# Patient Record
Sex: Female | Born: 1985
Health system: Southern US, Community
[De-identification: ages and names within clinical notes are randomized; demographics above are authoritative.]

## PROBLEM LIST (undated history)

## (undated) ENCOUNTER — Inpatient Hospital Stay (HOSPITAL_COMMUNITY): Payer: Self-pay

## (undated) DIAGNOSIS — R51 Headache: Secondary | ICD-10-CM

## (undated) DIAGNOSIS — I10 Essential (primary) hypertension: Secondary | ICD-10-CM

## (undated) DIAGNOSIS — O903 Peripartum cardiomyopathy: Secondary | ICD-10-CM

## (undated) DIAGNOSIS — Z973 Presence of spectacles and contact lenses: Secondary | ICD-10-CM

## (undated) DIAGNOSIS — D649 Anemia, unspecified: Secondary | ICD-10-CM

## (undated) DIAGNOSIS — K219 Gastro-esophageal reflux disease without esophagitis: Secondary | ICD-10-CM

## (undated) DIAGNOSIS — F419 Anxiety disorder, unspecified: Secondary | ICD-10-CM

## (undated) DIAGNOSIS — G43909 Migraine, unspecified, not intractable, without status migrainosus: Secondary | ICD-10-CM

## (undated) DIAGNOSIS — I509 Heart failure, unspecified: Secondary | ICD-10-CM

## (undated) DIAGNOSIS — R519 Headache, unspecified: Secondary | ICD-10-CM

## (undated) DIAGNOSIS — J302 Other seasonal allergic rhinitis: Secondary | ICD-10-CM

## (undated) DIAGNOSIS — N390 Urinary tract infection, site not specified: Secondary | ICD-10-CM

## (undated) DIAGNOSIS — F32A Depression, unspecified: Secondary | ICD-10-CM

## (undated) DIAGNOSIS — K259 Gastric ulcer, unspecified as acute or chronic, without hemorrhage or perforation: Secondary | ICD-10-CM

## (undated) DIAGNOSIS — F329 Major depressive disorder, single episode, unspecified: Secondary | ICD-10-CM

## (undated) HISTORY — PX: NO PAST SURGERIES: SHX2092

## (undated) HISTORY — DX: Depression, unspecified: F32.A

## (undated) HISTORY — DX: Headache, unspecified: R51.9

## (undated) HISTORY — PX: COLONOSCOPY: SHX174

## (undated) HISTORY — DX: Migraine, unspecified, not intractable, without status migrainosus: G43.909

## (undated) HISTORY — DX: Major depressive disorder, single episode, unspecified: F32.9

## (undated) HISTORY — DX: Urinary tract infection, site not specified: N39.0

## (undated) HISTORY — DX: Anemia, unspecified: D64.9

## (undated) HISTORY — DX: Headache: R51

---
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
## 2008-06-22 ENCOUNTER — Emergency Department (HOSPITAL_COMMUNITY): Admission: EM | Admit: 2008-06-22 | Discharge: 2008-06-22 | Payer: Self-pay | Admitting: Emergency Medicine

## 2008-09-10 ENCOUNTER — Emergency Department (HOSPITAL_COMMUNITY): Admission: EM | Admit: 2008-09-10 | Discharge: 2008-09-10 | Payer: Self-pay | Admitting: Emergency Medicine

## 2008-09-13 ENCOUNTER — Emergency Department (HOSPITAL_COMMUNITY): Admission: EM | Admit: 2008-09-13 | Discharge: 2008-09-13 | Payer: Self-pay | Admitting: Emergency Medicine

## 2009-01-11 ENCOUNTER — Emergency Department (HOSPITAL_BASED_OUTPATIENT_CLINIC_OR_DEPARTMENT_OTHER): Admission: EM | Admit: 2009-01-11 | Discharge: 2009-01-11 | Payer: Self-pay | Admitting: Emergency Medicine

## 2009-06-24 ENCOUNTER — Emergency Department (HOSPITAL_COMMUNITY): Admission: EM | Admit: 2009-06-24 | Discharge: 2009-06-24 | Payer: Self-pay | Admitting: Emergency Medicine

## 2010-10-12 LAB — URINALYSIS, ROUTINE W REFLEX MICROSCOPIC
Ketones, ur: 15 mg/dL — AB
Nitrite: NEGATIVE
Protein, ur: NEGATIVE mg/dL
Urobilinogen, UA: 1 mg/dL (ref 0.0–1.0)

## 2010-10-12 LAB — PREGNANCY, URINE: Preg Test, Ur: NEGATIVE

## 2011-04-10 LAB — POCT URINALYSIS DIP (DEVICE)
Nitrite: NEGATIVE
Protein, ur: NEGATIVE mg/dL
Urobilinogen, UA: 1 mg/dL (ref 0.0–1.0)

## 2011-04-10 LAB — WET PREP, GENITAL

## 2011-04-10 LAB — POCT PREGNANCY, URINE: Preg Test, Ur: NEGATIVE

## 2012-07-06 NOTE — L&D Delivery Note (Signed)
Delivery Note At 7:50 AM a viable female was delivered via  (Presentation: vtx ;OA).  Nuchal cord x 1 noted and easily reduced.  No difficulty with shoulders.  Pt delivered in all fours position.  Cord doubly clamped, cut and to warmer for evaluation by NICU team due to moderate to thick meconium stained amniotic fluid.   APGAR: 8, 9; weight 6 lb 1.2 oz (2756 g).   Placenta status: Intact, Spontaneous.  Cord:  with the following complications: .  Cord pH: N/A  Anesthesia:  None Episiotomy: None Lacerations: None Suture Repair: N/A Est. Blood Loss (mL): 500  Mom to AICU.  Baby to AICU with mother.Marland Kitchen  Abron Neddo O. 03/18/2013, 8:16 AM

## 2012-08-06 ENCOUNTER — Encounter (HOSPITAL_COMMUNITY): Payer: Self-pay | Admitting: Obstetrics and Gynecology

## 2012-08-06 ENCOUNTER — Inpatient Hospital Stay (HOSPITAL_COMMUNITY)
Admission: AD | Admit: 2012-08-06 | Discharge: 2012-08-06 | Disposition: A | Discharge status: Home / Self Care | Payer: BC Managed Care – PPO | Source: Ambulatory Visit | Attending: Obstetrics & Gynecology | Admitting: Obstetrics & Gynecology

## 2012-08-06 DIAGNOSIS — R51 Headache: Secondary | ICD-10-CM | POA: Insufficient documentation

## 2012-08-06 DIAGNOSIS — O210 Mild hyperemesis gravidarum: Secondary | ICD-10-CM | POA: Insufficient documentation

## 2012-08-06 DIAGNOSIS — O10019 Pre-existing essential hypertension complicating pregnancy, unspecified trimester: Secondary | ICD-10-CM | POA: Insufficient documentation

## 2012-08-06 DIAGNOSIS — O219 Vomiting of pregnancy, unspecified: Secondary | ICD-10-CM

## 2012-08-06 DIAGNOSIS — O169 Unspecified maternal hypertension, unspecified trimester: Secondary | ICD-10-CM

## 2012-08-06 HISTORY — DX: Essential (primary) hypertension: I10

## 2012-08-06 LAB — URINALYSIS, ROUTINE W REFLEX MICROSCOPIC
Glucose, UA: NEGATIVE mg/dL
Ketones, ur: NEGATIVE mg/dL
Nitrite: NEGATIVE
Specific Gravity, Urine: 1.02 (ref 1.005–1.030)
Urobilinogen, UA: 0.2 mg/dL (ref 0.0–1.0)
pH: 7 (ref 5.0–8.0)

## 2012-08-06 MED ORDER — DIPHENHYDRAMINE HCL 50 MG/ML IJ SOLN
25.0000 mg | Freq: Once | INTRAMUSCULAR | Status: DC
Start: 2012-08-06 — End: 2012-08-06

## 2012-08-06 MED ORDER — PROMETHAZINE HCL 25 MG/ML IJ SOLN: 25.0000 mg | Freq: Once | INTRAMUSCULAR | Status: DC

## 2012-08-06 MED ORDER — PROMETHAZINE HCL 25 MG/ML IJ SOLN
25.0000 mg | Freq: Once | INTRAVENOUS | Status: AC
  Administered 2012-08-06: 25 mg via INTRAVENOUS
  Filled 2012-08-06: qty 1

## 2012-08-06 MED ORDER — LACTATED RINGERS IV BOLUS (SEPSIS): 1000.0000 mL | Freq: Once | INTRAVENOUS | Status: DC

## 2012-08-06 MED ORDER — ACETAMINOPHEN 500 MG PO TABS: 1000.0000 mg | ORAL_TABLET | Freq: Once | ORAL | Status: DC

## 2012-08-06 MED ORDER — ONDANSETRON HCL 4 MG PO TABS: 4.0000 mg | ORAL_TABLET | Freq: Four times a day (QID) | ORAL | Status: DC

## 2012-08-06 MED ORDER — PROMETHAZINE HCL 12.5 MG PO TABS: 12.5000 mg | ORAL_TABLET | Freq: Four times a day (QID) | ORAL | Status: DC | PRN

## 2012-08-06 MED ORDER — ONDANSETRON 8 MG PO TBDP: 8.0000 mg | ORAL_TABLET | Freq: Once | ORAL | Status: DC

## 2012-08-06 NOTE — MAU Note (Signed)
Pt presents to MAU with chief complaint of HA. She is approx. [redacted] weeks pregnant and says she went to [facility] to check her BP and it was elevated.  She has tried tylenol but nothing has helped. She has a strong family history of hypertension. She was seeing a primary care provider prior to pregnancy and diagnosed her with HTN.  They recommended medication and the patient refused.

## 2012-08-06 NOTE — MAU Provider Note (Signed)
History     CSN: [id number]  Arrival date and time: 08/06/12 1129   First Provider Initiated Contact with Patient 08/06/12 1212      Chief Complaint  Patient presents with  . Headache   HPI [name] is a 27 y.o. G1P0000 at [redacted]w[redacted]d who presents to MAU today with headache, elevated BP and N/V. The patient states that she has had N/V since last night. The last time she vomited was here in MAU. She has not had an appetite today. No PO intake since last night. She has also had headache since last night. It has been worse throughout the night and this morning. She had been diagnosed as chronic hypertensive previously, but had declined treatment. [facility] and had her BP taken. It was 142/99.  The patient has not yet started prenatal care. She has an appointment for [facility]. She has not yet started prenatal vitamins yet either. She is having mild epigastric discomfort. She denies vaginal bleeding, abnormal discharge or lower abdominal pain.    OB History    Grav Para Term Preterm Abortions TAB SAB Ect Mult Living   1 0 0 0 0 0 0 0 0 0       Past Medical History  Diagnosis Date  . Hypertension     Past Surgical History  Procedure Date  . No past surgeries     Family History  Problem Relation Age of Onset  . Hypertension Mother   . Hypertension Father     History  Substance Use Topics  . Smoking status: Never Smoker   . Smokeless tobacco: Not on file  . Alcohol Use: No    Allergies: No Known Allergies  Prescriptions prior to admission  Medication Sig Dispense Refill  . acetaminophen (TYLENOL) 325 MG tablet Take 650 mg by mouth every 6 (six) hours as needed. pain      . amoxicillin (AMOXIL) 500 MG capsule Take 500 mg by mouth 2 (two) times daily. Patient is on day 5 of a 10 day course        ROS All negative unless otherwise noted in HPI Physical Exam   Blood pressure 148/98, pulse 88, temperature 98.3  F (36.8 C), temperature source Oral, resp. rate 18, last menstrual period 06/20/2012.  Physical Exam  Constitutional: She is oriented to person, place, and time. She appears well-developed and well-nourished. No distress.  HENT:  Head: Normocephalic and atraumatic.  Cardiovascular: Normal rate, regular rhythm and normal heart sounds.   Respiratory: Breath sounds normal. No respiratory distress.  GI: Soft. Bowel sounds are normal. She exhibits no distension and no mass. There is tenderness (mild epigastric tenderness to palpation). There is no rebound and no guarding.  Neurological: She is alert and oriented to person, place, and time.  Skin: Skin is warm and dry. No erythema.  Psychiatric: She has a normal mood and affect.   Results for orders placed during the hospital encounter of 08/06/12 (from the past 24 hour(s))  URINALYSIS, ROUTINE W REFLEX MICROSCOPIC     Status: Abnormal   Collection Time   08/06/12 11:44 AM      Component Value Range   Color, Urine YELLOW  YELLOW   APPearance HAZY (*) CLEAR   Specific Gravity, Urine 1.020  1.005 - 1.030   pH 7.0  5.0 - 8.0   Glucose, UA NEGATIVE  NEGATIVE mg/dL   Hgb urine dipstick NEGATIVE  NEGATIVE  Bilirubin Urine NEGATIVE  NEGATIVE   Ketones, ur NEGATIVE  NEGATIVE mg/dL   Protein, ur NEGATIVE  NEGATIVE mg/dL   Urobilinogen, UA 0.2  0.0 - 1.0 mg/dL   Nitrite NEGATIVE  NEGATIVE   Leukocytes, UA NEGATIVE  NEGATIVE    MAU Course  Procedures None  MDM Discussed patient with Dr. Debroah Loop. He does not feel that the patient needs antihypertensives at this time. He would recommend that she keep follow-up with [facility] as scheduled to start prenatal care and discuss treatment of HTN in pregnancy  Patient states that she is nauseous on initial exam. Before Zofran is administered she begins actively vomiting. Order discontinued.  LR bolus IV with phenergan ordered for N/V RN had difficulty starting IV. Patient had one higher BP  reading during this time. She was nauseous and had significant discomfort at that time. BP's have otherwise been ok. 140s/90s N/V and headache improved.    Assessment and Plan  A: Nausea and Vomiting in pregnancy Chronic HTN; currently pregnant  P: Discharge home Rx for Phenergan and Zofran sent to patient's pharmacy Patient encouraged to increase PO hydration as tolerated Tylenol as needed for headache.  Symptoms of high blood pressure reviewed. Patient encouraged to return if symptoms arise.  Patient encouraged to keep appointment to start prenatal care with [facility] this week Patient may return to MAU as needed or if her condition were to change or worsen  Freddi Starr, PA-C 08/06/2012, 12:12 PM

## 2012-09-06 ENCOUNTER — Encounter (HOSPITAL_COMMUNITY): Payer: Self-pay | Admitting: *Deleted

## 2012-09-06 ENCOUNTER — Inpatient Hospital Stay (HOSPITAL_COMMUNITY)
Admission: AD | Admit: 2012-09-06 | Discharge: 2012-09-06 | Disposition: A | Discharge status: Home / Self Care | Payer: Medicaid Other | Source: Ambulatory Visit | Attending: Obstetrics and Gynecology | Admitting: Obstetrics and Gynecology

## 2012-09-06 DIAGNOSIS — J069 Acute upper respiratory infection, unspecified: Secondary | ICD-10-CM

## 2012-09-06 DIAGNOSIS — R51 Headache: Secondary | ICD-10-CM | POA: Insufficient documentation

## 2012-09-06 DIAGNOSIS — J029 Acute pharyngitis, unspecified: Secondary | ICD-10-CM | POA: Insufficient documentation

## 2012-09-06 DIAGNOSIS — O99891 Other specified diseases and conditions complicating pregnancy: Secondary | ICD-10-CM | POA: Insufficient documentation

## 2012-09-06 DIAGNOSIS — R0981 Nasal congestion: Secondary | ICD-10-CM

## 2012-09-06 DIAGNOSIS — O219 Vomiting of pregnancy, unspecified: Secondary | ICD-10-CM

## 2012-09-06 DIAGNOSIS — O210 Mild hyperemesis gravidarum: Secondary | ICD-10-CM | POA: Insufficient documentation

## 2012-09-06 DIAGNOSIS — J3489 Other specified disorders of nose and nasal sinuses: Secondary | ICD-10-CM | POA: Insufficient documentation

## 2012-09-06 LAB — URINALYSIS, ROUTINE W REFLEX MICROSCOPIC
Glucose, UA: NEGATIVE mg/dL
Hgb urine dipstick: NEGATIVE
Ketones, ur: 15 mg/dL — AB
Leukocytes, UA: NEGATIVE
Protein, ur: NEGATIVE mg/dL
pH: 6 (ref 5.0–8.0)

## 2012-09-06 MED ORDER — ACETAMINOPHEN 325 MG PO TABS
650.0000 mg | ORAL_TABLET | Freq: Once | ORAL | Status: AC
  Administered 2012-09-06: 650 mg via ORAL
  Filled 2012-09-06: qty 1

## 2012-09-06 MED ORDER — ONDANSETRON HCL 4 MG PO TABS
8.0000 mg | ORAL_TABLET | Freq: Once | ORAL | Status: AC
  Administered 2012-09-06: 8 mg via ORAL
  Filled 2012-09-06: qty 2
  Filled 2012-09-06: qty 1

## 2012-09-06 NOTE — MAU Provider Note (Signed)
History     CSN: [id number]  Arrival date and time: 09/06/12 1304   None     Chief Complaint  Patient presents with  . Headache  . Sore Throat  . Emesis   HPI Meredith Velez is 27 y.o. G1P0000 [redacted]w[redacted]d weeks presenting with headache last night, sore throat this am, nausea.  Has not vomiting.  Has yellow mucous.  Facial discomfort around her eyes and nose X 2 days.   Was a patient of Dr. Vance Gather until she changed jobs, now has Medicaid which they do not accept.  Plans continues to have care [facility].   Card expected in 1 week and will schedule care.   Had nausea for a few days, treated early in pregnancy.   Has ZOfran and phenergan at home, wasn't sure if she should take it.     Past Medical History  Diagnosis Date  . Hypertension     Past Surgical History  Procedure Laterality Date  . No past surgeries      Family History  Problem Relation Age of Onset  . Hypertension Mother   . Hypertension Father     History  Substance Use Topics  . Smoking status: Never Smoker   . Smokeless tobacco: Not on file  . Alcohol Use: No    Allergies: No Known Allergies  Prescriptions prior to admission  Medication Sig Dispense Refill  . acetaminophen (TYLENOL) 325 MG tablet Take 650 mg by mouth every 6 (six) hours as needed. pain      . Prenatal Vit-Fe Fumarate-FA (PRENATAL MULTIVITAMIN) TABS Take 1 tablet by mouth daily at 12 noon.        Review of Systems  Constitutional: Negative for fever and chills.  HENT: Positive for congestion and sore throat.        Sinus pressure.  Yellow phlegm  Gastrointestinal: Positive for nausea. Negative for vomiting and abdominal pain.  Neurological: Positive for headaches.   Physical Exam   Blood pressure 130/94, pulse 96, temperature 98.2 F (36.8 C), temperature source Oral, resp. rate 18, height 5\' 2"  (1.575 m), weight 157 lb (71.215 kg), last menstrual period 06/20/2012.  Physical Exam  Constitutional: She is oriented to person, place, and  time. She appears well-developed and well-nourished. No distress.  HENT:  Head: Normocephalic.  Nose: Right sinus exhibits no frontal sinus tenderness (mild). Left sinus exhibits no frontal sinus tenderness (mild).  Mouth/Throat: No oropharyngeal exudate, posterior oropharyngeal edema, posterior oropharyngeal erythema or tonsillar abscesses.  Cardiovascular: Normal rate.   Respiratory: Effort normal.  Genitourinary:  Not indicated  Neurological: She is alert and oriented to person, place, and time.  Skin: Skin is warm and dry.  Psychiatric: She has a normal mood and affect. Her behavior is normal.   Results for orders placed during the hospital encounter of 09/06/12 (from the past 24 hour(s))  RAPID STREP SCREEN     Status: None   Collection Time    09/06/12  1:04 PM      Result Value Range   Streptococcus, Group A Screen (Direct) NEGATIVE  NEGATIVE  URINALYSIS, ROUTINE W REFLEX MICROSCOPIC     Status: Abnormal   Collection Time    09/06/12  2:30 PM      Result Value Range   Color, Urine YELLOW  YELLOW   APPearance CLEAR  CLEAR   Specific Gravity, Urine 1.025  1.005 - 1.030   pH 6.0  5.0 - 8.0   Glucose, UA NEGATIVE  NEGATIVE mg/dL  Hgb urine dipstick NEGATIVE  NEGATIVE   Bilirubin Urine NEGATIVE  NEGATIVE   Ketones, ur 15 (*) NEGATIVE mg/dL   Protein, ur NEGATIVE  NEGATIVE mg/dL   Urobilinogen, UA 1.0  0.0 - 1.0 mg/dL   Nitrite NEGATIVE  NEGATIVE   Leukocytes, UA NEGATIVE  NEGATIVE   MAU Course  Procedures  MDM Zofran 8mg  ODT and Acetaminophen 650mg  given in MAU   Waiting on throat culture results.  Assessment and Plan  A: Upper respiratory infection     Sinus congestion      Nausea     [redacted]w[redacted]d gestation  P;  Instructed to use sudafed, mucinex or robitussin plain for symptomatic relief of sxs     Use Zofran /phenergan that she has at home as needed      Encouraged to continue prenatal care   KEY,EVE M 09/06/2012, 1:55 PM

## 2012-09-06 NOTE — MAU Note (Addendum)
sorethroat and headache since yesterday.  It is making her throw up.  Feels better after she throws up. Denies fever or chills. Is in process of switching dr.

## 2012-09-20 ENCOUNTER — Other Ambulatory Visit: Payer: Self-pay | Admitting: Certified Nurse Midwife

## 2012-09-21 LAB — GC/CHLAMYDIA PROBE AMP, URINE: Chlamydia, Swab/Urine, PCR: NEGATIVE

## 2012-09-22 LAB — CULTURE, OB URINE: Colony Count: 3000

## 2012-10-18 ENCOUNTER — Encounter: Payer: Self-pay | Admitting: Certified Nurse Midwife

## 2013-03-16 ENCOUNTER — Inpatient Hospital Stay (HOSPITAL_COMMUNITY)
Admission: AD | Admit: 2013-03-16 | Discharge: 2013-03-16 | Disposition: A | Discharge status: Home / Self Care | Payer: Medicaid Other | Source: Ambulatory Visit | Attending: Obstetrics and Gynecology | Admitting: Obstetrics and Gynecology

## 2013-03-16 ENCOUNTER — Encounter (HOSPITAL_COMMUNITY): Payer: Self-pay

## 2013-03-16 DIAGNOSIS — O479 False labor, unspecified: Secondary | ICD-10-CM | POA: Insufficient documentation

## 2013-03-16 DIAGNOSIS — O139 Gestational [pregnancy-induced] hypertension without significant proteinuria, unspecified trimester: Secondary | ICD-10-CM | POA: Insufficient documentation

## 2013-03-16 MED ORDER — LABETALOL HCL 100 MG PO TABS
200.0000 mg | ORAL_TABLET | Freq: Two times a day (BID) | ORAL | Status: DC
  Filled 2013-03-16: qty 2

## 2013-03-16 MED ORDER — LABETALOL HCL 100 MG PO TABS
200.0000 mg | ORAL_TABLET | Freq: Once | ORAL | Status: AC
  Administered 2013-03-16: 200 mg via ORAL

## 2013-03-16 NOTE — MAU Note (Signed)
Patient states she was seen in the office today and sent to MAU for an ultrasound to check the fluid around the baby and her blood pressure was higher. Denies bleeding or leaking and reports good fetal movement. Having some contractions.

## 2013-03-16 NOTE — Consult Note (Signed)
27 y.o. year old female,at [redacted]w[redacted]d gestation.  SUBJECTIVE:  Doing well.  OBJECTIVE:  BP 130/75  Pulse 88  Temp(Src) 98.1 F (36.7 C) (Oral)  Resp 16  Ht 5\' 2"  (1.575 m)  Wt 178 lb 9.6 oz (81.012 kg)  BMI 32.66 kg/m2  SpO2 100%  LMP 06/20/2012  Fetal Heart Tones:  Category 1  Contractions:          Few  The patient was given labetalol 200 mg by mouth by mouth. Her blood pressures decreased nicely.  ASSESSMENT:  [redacted]w[redacted]d Weeks Pregnancy  Pregnancy-induced hypertension.  PLAN:  Labetalol 200 mg by mouth twice each day.  Return to the office tomorrow for blood pressure check.  Call for problems. Kick counts outlined.  Leonard Schwartz, M.D.

## 2013-03-16 NOTE — Consult Note (Signed)
DATE: 03/16/2013  Maternity Admissions Unit History and Physical Exam for an Obstetrics Patient  Meredith Velez is a 27 y.o. female, G1P0000, at [redacted]w[redacted]d gestation, who presents for for evaluation of pregnancy-induced hypertension. She has been followed at the [facility] division [facility].  Her pregnancy has been complicated by an increase in her blood pressure in the third trimester. The patient denies headaches, blurred vision, and right upper quadrant tenderness. Her blood pressure in the office today was 170/110. Laboratory test performed yesterday showed a hemoglobin of 11.4. Her platelet count was 340,000. Her comprehensive metabolic panel was normal. Her protein to creatinine ratio was 0.11. See history below.  OB History   Grav Para Term Preterm Abortions TAB SAB Ect Mult Living   1 0 0 0 0 0 0 0 0 0       Past Medical History  Diagnosis Date  . Hypertension     Prescriptions prior to admission  Medication Sig Dispense Refill  . acetaminophen (TYLENOL) 325 MG tablet Take 650 mg by mouth every 6 (six) hours as needed. pain      . ferrous sulfate 325 (65 FE) MG tablet Take 325 mg by mouth daily with breakfast.      . Prenatal Vit-Fe Fumarate-FA (PRENATAL MULTIVITAMIN) TABS Take 1 tablet by mouth every morning.         Past Surgical History  Procedure Laterality Date  . No past surgeries      No Known Allergies  Family History: family history includes Hypertension in her father and mother.  Social History:  reports that she has never smoked. She does not have any smokeless tobacco history on file. She reports that she does not drink alcohol or use illicit drugs.  Review of systems: Normal pregnancy complaints.  Admission Physical Exam:    Body mass index is 32.66 kg/(m^2).  Blood pressure 154/108, pulse 84, temperature 98.1 F (36.7 C), temperature source Oral, resp. rate 16, height 5\' 2"  (1.575 m), weight 178 lb  9.6 oz (81.012 kg), last menstrual period 06/20/2012, SpO2 100.00%.  HEENT:                 Within normal limits Chest:                   Clear Heart:                    Regular rate and rhythm Abdomen:             Gravid and nontender Extremities:          Grossly normal Neurologic exam: Grossly normal, no clonus. Reflexes are two out of four. Pelvic exam:         Cervix: 1 cm by the office staff  NST: Category 1; Contractions: Few .  Ultrasound:  Single intrauterine gestation. Vertex presentation. Fundal placenta with a grade 1 or 2. Normal fetal motion. Normal fetal heart motion. Normal amniotic fluid volume. Amniotic fluid volume index equals 17 (2, 3, 6, 6).   Assessment:  [redacted]w[redacted]d gestation  Pregnancy-induced hypertension.  The patient does not meet the criteria for preeclampsia.  Nonfavorable cervix.  Fetal well-being established.  Plan:  We will treat the patient's elevation in her blood pressure. We will discharge her to home if we can manage her pressures with medication by mouth.   Janine Limbo 03/16/2013, 5:45 PM

## 2013-03-17 ENCOUNTER — Encounter (HOSPITAL_COMMUNITY): Payer: Self-pay | Admitting: *Deleted

## 2013-03-17 ENCOUNTER — Inpatient Hospital Stay (HOSPITAL_COMMUNITY)
Admission: AD | Admit: 2013-03-17 | Discharge: 2013-03-20 | DRG: 775 | Disposition: A | Discharge status: Home / Self Care | Payer: Medicaid Other | Source: Ambulatory Visit | Attending: Obstetrics and Gynecology | Admitting: Obstetrics and Gynecology

## 2013-03-17 DIAGNOSIS — O9903 Anemia complicating the puerperium: Secondary | ICD-10-CM | POA: Diagnosis not present

## 2013-03-17 DIAGNOSIS — D649 Anemia, unspecified: Secondary | ICD-10-CM | POA: Diagnosis not present

## 2013-03-17 DIAGNOSIS — O139 Gestational [pregnancy-induced] hypertension without significant proteinuria, unspecified trimester: Principal | ICD-10-CM | POA: Diagnosis present

## 2013-03-17 LAB — CBC
Hemoglobin: 11.9 g/dL — ABNORMAL LOW (ref 12.0–15.0)
MCHC: 33.5 g/dL (ref 30.0–36.0)
RBC: 3.83 MIL/uL — ABNORMAL LOW (ref 3.87–5.11)
WBC: 11.5 10*3/uL — ABNORMAL HIGH (ref 4.0–10.5)

## 2013-03-17 LAB — OB RESULTS CONSOLE GC/CHLAMYDIA: Gonorrhea: NEGATIVE

## 2013-03-17 LAB — TYPE AND SCREEN

## 2013-03-17 LAB — OB RESULTS CONSOLE GBS: GBS: NEGATIVE

## 2013-03-17 LAB — RAPID HIV SCREEN (WH-MAU): Rapid HIV Screen: NONREACTIVE

## 2013-03-17 LAB — AMNISURE RUPTURE OF MEMBRANE (ROM) NOT AT ARMC: Amnisure ROM: NEGATIVE

## 2013-03-17 LAB — ABO/RH: ABO/RH(D): O POS

## 2013-03-17 MED ORDER — OXYTOCIN BOLUS FROM INFUSION
500.0000 mL | Freq: Once | INTRAVENOUS | Status: AC | PRN
  Administered 2013-03-18: 500 mL via INTRAVENOUS

## 2013-03-17 MED ORDER — OXYTOCIN 40 UNITS IN LACTATED RINGERS INFUSION - SIMPLE MED: 62.5000 mL/h | Freq: Once | INTRAVENOUS | Status: AC | PRN

## 2013-03-17 MED ORDER — ZOLPIDEM TARTRATE 5 MG PO TABS
5.0000 mg | ORAL_TABLET | Freq: Every evening | ORAL | Status: DC | PRN
  Administered 2013-03-18: 5 mg via ORAL
  Filled 2013-03-17: qty 1

## 2013-03-17 MED ORDER — LABETALOL HCL 200 MG PO TABS
200.0000 mg | ORAL_TABLET | Freq: Two times a day (BID) | ORAL | Status: DC
  Administered 2013-03-17 – 2013-03-18 (×2): 200 mg via ORAL
  Filled 2013-03-17 (×3): qty 1

## 2013-03-17 MED ORDER — IBUPROFEN 600 MG PO TABS
600.0000 mg | ORAL_TABLET | Freq: Four times a day (QID) | ORAL | Status: DC | PRN
  Administered 2013-03-18: 600 mg via ORAL
  Filled 2013-03-17: qty 1

## 2013-03-17 MED ORDER — MISOPROSTOL 25 MCG QUARTER TABLET
25.0000 ug | ORAL_TABLET | ORAL | Status: AC | PRN
  Administered 2013-03-17 – 2013-03-18 (×3): 25 ug via VAGINAL
  Filled 2013-03-17 (×3): qty 0.25

## 2013-03-17 MED ORDER — LACTATED RINGERS IV SOLN: 500.0000 mL | INTRAVENOUS | Status: DC | PRN

## 2013-03-17 MED ORDER — TERBUTALINE SULFATE 1 MG/ML IJ SOLN: 0.2500 mg | Freq: Once | INTRAMUSCULAR | Status: AC | PRN

## 2013-03-17 MED ORDER — LACTATED RINGERS IV SOLN
INTRAVENOUS | Status: DC
  Administered 2013-03-17 – 2013-03-18 (×2): via INTRAVENOUS

## 2013-03-17 MED ORDER — LIDOCAINE HCL (PF) 1 % IJ SOLN: 30.0000 mL | INTRAMUSCULAR | Status: DC | PRN

## 2013-03-17 MED ORDER — CITRIC ACID-SODIUM CITRATE 334-500 MG/5ML PO SOLN: 30.0000 mL | ORAL | Status: DC | PRN

## 2013-03-17 MED ORDER — ACETAMINOPHEN 325 MG PO TABS
650.0000 mg | ORAL_TABLET | ORAL | Status: DC | PRN
  Administered 2013-03-17 – 2013-03-18 (×2): 650 mg via ORAL
  Filled 2013-03-17 (×2): qty 2

## 2013-03-17 MED ORDER — ONDANSETRON HCL 4 MG/2ML IJ SOLN
4.0000 mg | Freq: Four times a day (QID) | INTRAMUSCULAR | Status: DC | PRN
  Administered 2013-03-17 – 2013-03-18 (×2): 4 mg via INTRAVENOUS
  Filled 2013-03-17 (×2): qty 2

## 2013-03-17 MED ORDER — OXYTOCIN 40 UNITS IN LACTATED RINGERS INFUSION - SIMPLE MED
1.0000 m[IU]/min | INTRAVENOUS | Status: DC
  Filled 2013-03-17: qty 1000

## 2013-03-17 MED ORDER — BUTORPHANOL TARTRATE 1 MG/ML IJ SOLN
2.0000 mg | INTRAMUSCULAR | Status: DC | PRN
  Filled 2013-03-17: qty 2

## 2013-03-17 MED ORDER — LABETALOL HCL 5 MG/ML IV SOLN: 20.0000 mg | Freq: Once | INTRAVENOUS | Status: AC | PRN

## 2013-03-17 MED ORDER — LABETALOL HCL 5 MG/ML IV SOLN: 10.0000 mg | Freq: Once | INTRAVENOUS | Status: AC | PRN

## 2013-03-17 MED ORDER — OXYCODONE-ACETAMINOPHEN 5-325 MG PO TABS: 1.0000 | ORAL_TABLET | ORAL | Status: DC | PRN

## 2013-03-17 NOTE — H&P (Signed)
Meredith Velez is a 27 y.o. female presenting for induction after presenting today and yesterday with elevated BPs and headache.  Gest htn labs were neg for preeclampsia and pt started on labetalol which she said she did not take yest and so we had her take it today after morning visit and return to office for eval.  BP remained elevated and HA was not relieved with tylenol.  Pt denied abdominal pain or visual changes.  She reports good fetal movement, no LOF and no ctxs.  After speaking with her mother, the pt wanted to proceed with induction.  The pt has a vague h/o borderline htn but says she was never on meds and BP was only high when she was on OCPs and when they were stopped her BPs improved.  History OB History   Grav Para Term Preterm Abortions TAB SAB Ect Mult Living   1 0 0 0 0 0 0 0 0 0      Past Medical History  Diagnosis Date  . Hypertension    Past Surgical History  Procedure Laterality Date  . No past surgeries     Family History: family history includes Hypertension in her father and mother. Social History:  reports that she has never smoked. She does not have any smokeless tobacco history on file. She reports that she does not drink alcohol or use illicit drugs.   Prenatal Transfer Tool  Maternal Diabetes: No Genetic Screening: Declined (don't see where she had it done) Maternal Ultrasounds/Referrals: Normal Fetal Ultrasounds or other Referrals:  None Maternal Substance Abuse:  No Significant Maternal Medications:  None Significant Maternal Lab Results:  Lab values include: Group B Strep negative Other Comments:  None  ROS Non-contributory   Last menstrual period 06/20/2012. Exam Physical Exam   Lungs CTA CV RRR Abd soft, gravid, NT Ext no calf tenderness, 2+ reflexes  Prenatal labs: ABO, Rh:   Antibody:   Rubella:   RPR:   NR HBsAg:    HIV:    GBS:   neg  Assessment/Plan: G1P0 at 39 3/7 wks with gest hypertension and headache started on  labetalol which pt has not taken as prescribed but even after taking the medication BPs still as high as 170s/110s.  I reviewed options with the patient including the risks, benefits and alternatives of being induced and pt would like to proceed with induction.  With cervix being unfavorable, will start with cytotec for cervical ripening and then pitocin per protocol.  Pt's questions were answered in office. Will draw type and screen, rubella, HIV and HepBsAg.  Purcell Nails 03/17/2013, 6:03 PM

## 2013-03-17 NOTE — Progress Notes (Signed)
  Subjective: Feeling some contractions--? Episode of leaking.  Objective: BP 132/89  Pulse 125  Temp(Src) 98.4 F (36.9 C) (Oral)  Resp 18  Ht 5\' 2"  (1.575 m)  Wt 179 lb (81.194 kg)  BMI 32.73 kg/m2  LMP 06/20/2012   Received Labetalol 200 mg po at 6:30pm    FHT: Category 1 UC:   irregular, mild SVE:   Dilation: 1.5 Effacement (%): Thick Station: -3 Exam by:: Susie Nix RN Cytotech 25 mcg placed by RN at 6:50pm  No evidence active leaking  Amnisure negative.  Assessment / Plan: IUP at 39 3/7 weeks Induction for gestational hypertension Plan Cytotech 25 mcg q 4 hours, with plan for pitocin in the am, as appropriate.   Nigel Bridgeman 03/17/2013, 10:41 PM

## 2013-03-18 ENCOUNTER — Encounter (HOSPITAL_COMMUNITY): Payer: Self-pay | Admitting: *Deleted

## 2013-03-18 LAB — CBC
MCH: 30.8 pg (ref 26.0–34.0)
Platelets: 322 10*3/uL (ref 150–400)
RBC: 3.83 MIL/uL — ABNORMAL LOW (ref 3.87–5.11)
WBC: 13.8 10*3/uL — ABNORMAL HIGH (ref 4.0–10.5)

## 2013-03-18 LAB — COMPREHENSIVE METABOLIC PANEL
ALT: 20 U/L (ref 0–35)
AST: 21 U/L (ref 0–37)
CO2: 20 mEq/L (ref 19–32)
Calcium: 9.5 mg/dL (ref 8.4–10.5)
GFR calc non Af Amer: >90 mL/min (ref >90)
Potassium: 3.9 mEq/L (ref 3.5–5.1)
Sodium: 135 mEq/L (ref 135–145)

## 2013-03-18 LAB — RPR: RPR Ser Ql: NONREACTIVE

## 2013-03-18 LAB — MRSA PCR SCREENING: MRSA by PCR: NEGATIVE

## 2013-03-18 MED ORDER — WITCH HAZEL-GLYCERIN EX PADS: 1.0000 "application " | MEDICATED_PAD | CUTANEOUS | Status: DC | PRN

## 2013-03-18 MED ORDER — FENTANYL 2.5 MCG/ML BUPIVACAINE 1/10 % EPIDURAL INFUSION (WH - ANES): 14.0000 mL/h | INTRAMUSCULAR | Status: DC | PRN

## 2013-03-18 MED ORDER — DIPHENHYDRAMINE HCL 50 MG/ML IJ SOLN: 12.5000 mg | INTRAMUSCULAR | Status: DC | PRN

## 2013-03-18 MED ORDER — OXYCODONE-ACETAMINOPHEN 5-325 MG PO TABS
1.0000 | ORAL_TABLET | ORAL | Status: DC | PRN
  Administered 2013-03-18 – 2013-03-19 (×2): 1 via ORAL
  Filled 2013-03-18 (×2): qty 1

## 2013-03-18 MED ORDER — CARBOPROST TROMETHAMINE 250 MCG/ML IM SOLN
INTRAMUSCULAR | Status: AC
  Filled 2013-03-18: qty 1

## 2013-03-18 MED ORDER — SIMETHICONE 80 MG PO CHEW: 80.0000 mg | CHEWABLE_TABLET | ORAL | Status: DC | PRN

## 2013-03-18 MED ORDER — PHENYLEPHRINE 40 MCG/ML (10ML) SYRINGE FOR IV PUSH (FOR BLOOD PRESSURE SUPPORT): 80.0000 ug | PREFILLED_SYRINGE | INTRAVENOUS | Status: DC | PRN

## 2013-03-18 MED ORDER — ONDANSETRON HCL 4 MG PO TABS: 4.0000 mg | ORAL_TABLET | ORAL | Status: DC | PRN

## 2013-03-18 MED ORDER — LANOLIN HYDROUS EX OINT: TOPICAL_OINTMENT | CUTANEOUS | Status: DC | PRN

## 2013-03-18 MED ORDER — PRENATAL MULTIVITAMIN CH
1.0000 | ORAL_TABLET | Freq: Every day | ORAL | Status: DC
  Administered 2013-03-19: 1 via ORAL
  Filled 2013-03-18: qty 1

## 2013-03-18 MED ORDER — IBUPROFEN 600 MG PO TABS
600.0000 mg | ORAL_TABLET | Freq: Four times a day (QID) | ORAL | Status: DC
  Administered 2013-03-18 – 2013-03-20 (×7): 600 mg via ORAL
  Filled 2013-03-18 (×7): qty 1

## 2013-03-18 MED ORDER — MISOPROSTOL 200 MCG PO TABS
ORAL_TABLET | ORAL | Status: AC
  Administered 2013-03-18: 1000 ug via RECTAL
  Filled 2013-03-18: qty 5

## 2013-03-18 MED ORDER — LACTATED RINGERS IV SOLN
500.0000 mL | Freq: Once | INTRAVENOUS | Status: AC
  Administered 2013-03-18: 07:00:00 via INTRAVENOUS

## 2013-03-18 MED ORDER — LABETALOL HCL 200 MG PO TABS: 200.0000 mg | ORAL_TABLET | Freq: Two times a day (BID) | ORAL | Status: DC

## 2013-03-18 MED ORDER — DIBUCAINE 1 % RE OINT
1.0000 "application " | TOPICAL_OINTMENT | RECTAL | Status: DC | PRN
  Filled 2013-03-18: qty 28

## 2013-03-18 MED ORDER — NIFEDIPINE ER 30 MG PO TB24
30.0000 mg | ORAL_TABLET | Freq: Every day | ORAL | Status: DC
  Administered 2013-03-18 – 2013-03-20 (×3): 30 mg via ORAL
  Filled 2013-03-18 (×3): qty 1

## 2013-03-18 MED ORDER — SENNOSIDES-DOCUSATE SODIUM 8.6-50 MG PO TABS
2.0000 | ORAL_TABLET | ORAL | Status: DC
  Administered 2013-03-18 – 2013-03-19 (×2): 2 via ORAL

## 2013-03-18 MED ORDER — BENZOCAINE-MENTHOL 20-0.5 % EX AERO
1.0000 "application " | INHALATION_SPRAY | CUTANEOUS | Status: DC | PRN
  Filled 2013-03-18 (×2): qty 56

## 2013-03-18 MED ORDER — TETANUS-DIPHTH-ACELL PERTUSSIS 5-2.5-18.5 LF-MCG/0.5 IM SUSP
0.5000 mL | Freq: Once | INTRAMUSCULAR | Status: DC
  Filled 2013-03-18: qty 0.5

## 2013-03-18 MED ORDER — EPHEDRINE 5 MG/ML INJ: 10.0000 mg | INTRAVENOUS | Status: DC | PRN

## 2013-03-18 MED ORDER — DIPHENHYDRAMINE HCL 25 MG PO CAPS: 25.0000 mg | ORAL_CAPSULE | Freq: Four times a day (QID) | ORAL | Status: DC | PRN

## 2013-03-18 MED ORDER — LABETALOL HCL 5 MG/ML IV SOLN
INTRAVENOUS | Status: AC
  Administered 2013-03-18: 10 mg via INTRAVENOUS
  Filled 2013-03-18: qty 4

## 2013-03-18 MED ORDER — ONDANSETRON HCL 4 MG/2ML IJ SOLN: 4.0000 mg | INTRAMUSCULAR | Status: DC | PRN

## 2013-03-18 MED ORDER — BUTORPHANOL TARTRATE 1 MG/ML IJ SOLN
1.0000 mg | INTRAMUSCULAR | Status: DC | PRN
  Administered 2013-03-18: 1 mg via INTRAVENOUS

## 2013-03-18 MED ORDER — LABETALOL HCL 5 MG/ML IV SOLN
10.0000 mg | INTRAVENOUS | Status: DC | PRN
  Administered 2013-03-18: 10 mg via INTRAVENOUS
  Filled 2013-03-18 (×2): qty 4

## 2013-03-18 MED ORDER — MISOPROSTOL 200 MCG PO TABS: 1000.0000 ug | ORAL_TABLET | Freq: Once | ORAL | Status: AC

## 2013-03-18 MED ORDER — ZOLPIDEM TARTRATE 5 MG PO TABS: 5.0000 mg | ORAL_TABLET | Freq: Every evening | ORAL | Status: DC | PRN

## 2013-03-18 NOTE — Progress Notes (Signed)
Sitting up breastfeeding baby-having some difficulty-support provided

## 2013-03-18 NOTE — Progress Notes (Signed)
03/18/13 1200  Vitals  BP ! 160/106 mmHg  MAP (mmHg) 118  pt sitting up breastfeeding

## 2013-03-18 NOTE — Progress Notes (Signed)
  Subjective: Crying out with contractions, but very drowsy between.  Mother at bedside, very supportive.  Received Stadol at 5:10am.  Objective: BP 153/99  Pulse 106  Temp(Src) 98.6 F (37 C) (Oral)  Resp 16  Ht 5\' 2"  (1.575 m)  Wt 179 lb (81.194 kg)  BMI 32.73 kg/m2  SpO2 100%  LMP 06/20/2012       FHT:  Mild variables and ? Early decels with contractions, but difficult to assess timing of decels. UC:   regular, every 3 minutes SVE: 5 cm, 100%, vtx, -1 station IUPC and FSE inserted--+ scalp stimulation response. Dark MSF noted.    Assessment / Plan: Progressive labor Induction for gestational hypertension Will repeat PIH labs Epidural   Susan Arana 03/18/2013, 6:57 AM

## 2013-03-18 NOTE — Progress Notes (Signed)
  Subjective: Crying with contractions--requesting pain medication.  Objective: BP 147/89  Pulse 93  Temp(Src) 98.4 F (36.9 C) (Oral)  Resp 16  Ht 5\' 2"  (1.575 m)  Wt 179 lb (81.194 kg)  BMI 32.73 kg/m2  LMP 06/20/2012      Filed Vitals:   03/17/13 2212 03/17/13 2314 03/18/13 0012 03/18/13 0333  BP: 145/89 152/90 153/92 147/89  Pulse: 91 96 82 93  Temp:  98.9 F (37.2 C)  98.4 F (36.9 C)  TempSrc:  Oral  Oral  Resp: 18 16  16   Height:      Weight:         FHT:  Category 2--decreased variability since Ambien, no decels. UC:   regular, every 3 minutes SVE:  Loose 1 cm, 75%, vtx, -2. Cervix very posterior.  Assessment / Plan: Induction of labor due to gestational hypertension. Will give Stadol and observe. Will likely defer 4th dose of Cytotech.  Tayt Moyers 03/18/2013, 5:03 AM

## 2013-03-18 NOTE — Progress Notes (Signed)
At 1015 patient had another gush of 100cc blood during fundal massage. At this point, patient has had a total of 700cc EBL. RN kept pitocin running IV at maintenance rate 62.74ml/hr. Elsie Ra, CNM was notified of total EBL and stage 1 postpartum hemorhage. No orders given at this time. ICU RN was notifed, and will continue to notify.

## 2013-03-18 NOTE — Progress Notes (Addendum)
  Subjective: Sleeping at present.    Objective: BP 153/92  Pulse 82  Temp(Src) 98.9 F (37.2 C) (Oral)  Resp 16  Ht 5\' 2"  (1.575 m)  Wt 179 lb (81.194 kg)  BMI 32.73 kg/m2  LMP 06/20/2012      Filed Vitals:   03/17/13 2116 03/17/13 2212 03/17/13 2314 03/18/13 0012  BP: 132/89 145/89 152/90 153/92  Pulse: 125 91 96 82  Temp:   98.9 F (37.2 C)   TempSrc:   Oral   Resp:  18 16   Height:      Weight:       Received 2nd dose of Cytotech at 11:21p  FHT:  Category 1 UC:   Irregular, mild SVE:   1-2, 50%, vtx, -3 by Chari Manning, RN.  Results for orders placed during the hospital encounter of 03/17/13 (from the past 24 hour(s))  ABO/RH     Status: None   Collection Time    03/17/13  6:15 PM      Result Value Range   ABO/RH(D) O POS    CBC     Status: Abnormal   Collection Time    03/17/13  6:17 PM      Result Value Range   WBC 11.5 (*) 4.0 - 10.5 K/uL   RBC 3.83 (*) 3.87 - 5.11 MIL/uL   Hemoglobin 11.9 (*) 12.0 - 15.0 g/dL   HCT 13.2 (*) 44.0 - 10.2 %   MCV 92.7  78.0 - 100.0 fL   MCH 31.1  26.0 - 34.0 pg   MCHC 33.5  30.0 - 36.0 g/dL   RDW 72.5  36.6 - 44.0 %   Platelets 302  150 - 400 K/uL  RPR     Status: None   Collection Time    03/17/13  6:17 PM      Result Value Range   RPR NON REACTIVE  NON REACTIVE  HEPATITIS B SURFACE ANTIGEN     Status: None   Collection Time    03/17/13  6:30 PM      Result Value Range   Hepatitis B Surface Ag NEGATIVE  NEGATIVE  RAPID HIV SCREEN ([facility])     Status: None   Collection Time    03/17/13  6:30 PM      Result Value Range   SUDS Rapid HIV Screen NON REACTIVE  NON REACTIVE  TYPE AND SCREEN     Status: None   Collection Time    03/17/13  6:30 PM      Result Value Range   ABO/RH(D) O POS     Antibody Screen NEG     Sample Expiration 03/20/2013    AMNISURE RUPTURE OF MEMBRANE (ROM)     Status: None   Collection Time    03/17/13  9:11 PM      Result Value Range   Amnisure ROM NEGATIVE       Assessment /  Plan: Induction of labor--gestational hypertension Continue Cytotech 25 mcg q 4 hours at present. Monitor BP  Tamaya Pun 03/18/2013, 2:37 AM

## 2013-03-18 NOTE — Progress Notes (Signed)
  Subjective: Sleeping s/p Ambien, awakened for exam.  Not aware of any contractions.  Received Tylenol around MN for mild HA, now resolved.  Objective: BP 147/89  Pulse 93  Temp(Src) 98.4 F (36.9 C) (Oral)  Resp 16  Ht 5\' 2"  (1.575 m)  Wt 179 lb (81.194 kg)  BMI 32.73 kg/m2  LMP 06/20/2012      Filed Vitals:   03/17/13 2212 03/17/13 2314 03/18/13 0012 03/18/13 0333  BP: 145/89 152/90 153/92 147/89  Pulse: 91 96 82 93  Temp:  98.9 F (37.2 C)  98.4 F (36.9 C)  TempSrc:  Oral  Oral  Resp: 18 16  16   Height:      Weight:         FHT:  Category 1 UC:   Irregular, mild. SVE:   Dilation: 1.5 Effacement (%): Thick Station: -3 Exam by:: [employer] # 3 placed at 3:30am in posterior fornix.  Assessment / Plan: Induction of labor--3rd dose Cytotech placed. Re-evaluate cervix at 7:30am for next step in induction (Cytotech, foley, etc)  Ariatna Jester 03/18/2013, 3:36 AM

## 2013-03-19 LAB — COMPREHENSIVE METABOLIC PANEL
AST: 33 U/L (ref 0–37)
Albumin: 2.3 g/dL — ABNORMAL LOW (ref 3.5–5.2)
BUN: 6 mg/dL (ref 6–23)
CO2: 22 mEq/L (ref 19–32)
Calcium: 8.9 mg/dL (ref 8.4–10.5)
Creatinine, Ser: 0.65 mg/dL (ref 0.50–1.10)
GFR calc non Af Amer: >90 mL/min (ref >90)

## 2013-03-19 LAB — CBC
MCH: 31.5 pg (ref 26.0–34.0)
MCHC: 34.1 g/dL (ref 30.0–36.0)
RDW: 14.4 % (ref 11.5–15.5)

## 2013-03-19 LAB — LACTATE DEHYDROGENASE: LDH: 334 U/L — ABNORMAL HIGH (ref 94–250)

## 2013-03-19 LAB — URIC ACID: Uric Acid, Serum: 5 mg/dL (ref 2.4–7.0)

## 2013-03-19 MED ORDER — POLYSACCHARIDE IRON COMPLEX 150 MG PO CAPS
150.0000 mg | ORAL_CAPSULE | Freq: Two times a day (BID) | ORAL | Status: DC
  Administered 2013-03-19 – 2013-03-20 (×2): 150 mg via ORAL
  Filled 2013-03-19 (×3): qty 1

## 2013-03-19 NOTE — Progress Notes (Signed)
Meredith Velez is a 27 y.o. G1P1001 at [redacted]w[redacted]d admitted for induction of labor due to gestational hypertension.  Subjective: Pt breathing well with contractions.  C/O urge to push with UCs.    Objective: BP 186/93  Pulse 119  Temp(Src) 98.6 F (37.0 C) (Oral)  Resp 18  Ht 5\' 2"  (1.575 m)  Wt 75.206 kg (165 lb 12.8 oz)  BMI 30.32 kg/m2  SpO2 99%  LMP 06/20/2012  FHT:  FHR: 140 bpm, variability: marked,  accelerations:  Abscent,  decelerations:  Present early decels noted.  Upon entrance to room, variable decel noted which was prolonged with a duration of 6 mins with a nadir of 45 bpm.  Variable resolved with position chgs to hands/knees,  IVF bolus, and 02 per mask.   UC:   regular, every 2-3 minutes by IUPC SVE:   Dilation: 10 Effacement (%): 100 Station: +2 Exam by:: Meredith Velez, CNM  Assessment / Plan: Induction of labor due to gestational hypertension,  progressing well.  Labor: Progressing normally Preeclampsia:  no signs or symptoms of toxicity Fetal Wellbeing:  Category I Pain Control:  Labor support without medications I/D:  n/a Anticipated MOD:  NSVD  Will begin pushing.  Meredith Velez O. 03/19/2013, 5:50 AM

## 2013-03-19 NOTE — Progress Notes (Signed)
Patient ID: Meredith Velez, female   DOB: 11-Feb-1986, 27 y.o.   MRN: [id number]  Subjective: Pt resting comfortably.  She currently denies headache, vision chgs, RUQ pain.    Objective: BPs since delivery 131/81 to 161/115 maximum.  Other VSS. IV Labetalol x 1 given as well as po Labetalol restarted at 10:00.   DTRs 1+, no clonus.  No edema noted.  Assessment: Gestational hypertension-delivered Elevated blood pressure  Plan: Consult obtained with Dr. Idamae Schuller. Per Dr. Idamae Schuller, pt to be transferred to [facility] rather than mother baby so that blood pressures can be monitored more closely.   Recommendations d/w pt and pt agrees.    Elsie Ra, CNM

## 2013-03-19 NOTE — Plan of Care (Signed)
Problem: Discharge Progression Outcomes Goal: Barriers To Progression Addressed/Resolved Outcome: Completed/Met Date Met:  03/19/13 BP parameters met with po medication

## 2013-03-20 MED ORDER — IBUPROFEN 800 MG PO TABS: 800.0000 mg | ORAL_TABLET | Freq: Three times a day (TID) | ORAL | Status: DC | PRN

## 2013-03-20 MED ORDER — NIFEDIPINE ER OSMOTIC RELEASE 30 MG PO TB24: 30.0000 mg | ORAL_TABLET | Freq: Every day | ORAL | Status: DC

## 2013-03-20 MED ORDER — FERROUS SULFATE 325 (65 FE) MG PO TABS: 325.0000 mg | ORAL_TABLET | Freq: Three times a day (TID) | ORAL | Status: DC

## 2013-03-20 MED ORDER — OXYCODONE-ACETAMINOPHEN 5-325 MG PO TABS: 1.0000 | ORAL_TABLET | ORAL | Status: DC | PRN

## 2013-03-20 NOTE — Discharge Summary (Signed)
  Vaginal Delivery Discharge Summary  Meredith Velez  DOB:    06-19-1986 MRN:    [id number] CSN:    [id number]  Date of admission:                  03/17/2013  Date of discharge:                   03/20/2013  Procedures this admission:  Date of Delivery: 03/18/2013 normal spontaneous vaginal delivery by Elsie Ra, certified nurse midwife  Newborn Data:  Live born female  Birth Weight: 6 lb 1.2 oz (2756 g) APGAR: 8, 9  Home with mother. Name: Meredith Velez Circumcision Plan: NA  History of Present Illness:  [name] is a 27 y.o. female, G1P1001, who presents at [redacted]w[redacted]d weeks gestation. The patient has been followed at the [facility] and Gynecology division of [facility]. She was admitted induction of labor. Her pregnancy has been complicated by: Pregnancy-induced hypertension in the third trimester.  Hospital course:  The patient was admitted for induction. Her preeclampsia labs were negative.   Her labor was complicated by PIH and headache. She proceeded to have a vaginal delivery of a healthy infant. Her delivery was not complicated. Her postpartum course was not complicated. Her blood pressures were maintained in an appropriate range using Procardia XL 30. She was discharged to home on postpartum day 2 doing well.  Feeding:  breast  Contraception:  Undecided.  Discharge hemoglobin:  Hemoglobin  Date Value Range Status  03/19/2013 9.8* 12.0 - 15.0 g/dL Final     REPEATED TO VERIFY     DELTA CHECK NOTED     HCT  Date Value Range Status  03/19/2013 28.7* 36.0 - 46.0 % Final    Discharge Physical Exam:   General: no distress Lochia: appropriate Uterine Fundus: firm Incision: NA DVT Evaluation: No evidence of DVT seen on physical exam.  Intrapartum Procedures: spontaneous vaginal delivery Postpartum Procedures: none Complications-Operative and Postpartum: none  Discharge Diagnoses: Term Pregnancy-delivered and  Pregnancy-induced hypertension, anemia  Discharge Information:  Activity:           pelvic rest Diet:                routine Medications: PNV, Ibuprofen, Iron, Percocet and Procardia XL 30 Condition:      stable and improved Instructions:  Routine instructions for postpartum vaginal delivery and postpartum blues Discharge to: home  Follow-up Information   Follow up with [facility] In 6 weeks.   Contact information:   [address], [address] [place] [address]        Janine Limbo 03/20/2013

## 2013-03-20 NOTE — Progress Notes (Signed)
Post discharge review completed. 

## 2013-03-20 NOTE — Lactation Note (Signed)
This note was copied from the chart of Meredith Amsi Grimley. Lactation Consultation Note: Follow up visit with mom .She reports that baby has nursed q 2 hours through the night and breasts are feeling a little fuller this morning. Baby asleep in bassinet at this time. No questions at present. To call prn. BF brochure given to mom with resources for support after DC and our telephone number.   Patient Name: Meredith Velez [id number] Date: 03/20/2013 Reason for consult: Follow-up assessment   Maternal Data    Feeding    LATCH Score/Interventions                      Lactation Tools Discussed/Used     Consult Status Consult Status: Complete    Pamelia Hoit 03/20/2013, 9:22 AM

## 2013-07-18 ENCOUNTER — Emergency Department (HOSPITAL_COMMUNITY)
Admission: EM | Admit: 2013-07-18 | Discharge: 2013-07-18 | Disposition: A | Discharge status: Home / Self Care | Payer: 59 | Attending: Emergency Medicine | Admitting: Emergency Medicine

## 2013-07-18 ENCOUNTER — Encounter (HOSPITAL_COMMUNITY): Payer: Self-pay | Admitting: Emergency Medicine

## 2013-07-18 DIAGNOSIS — I10 Essential (primary) hypertension: Secondary | ICD-10-CM

## 2013-07-18 DIAGNOSIS — R112 Nausea with vomiting, unspecified: Secondary | ICD-10-CM | POA: Insufficient documentation

## 2013-07-18 DIAGNOSIS — H9209 Otalgia, unspecified ear: Secondary | ICD-10-CM | POA: Insufficient documentation

## 2013-07-18 DIAGNOSIS — G43909 Migraine, unspecified, not intractable, without status migrainosus: Secondary | ICD-10-CM | POA: Insufficient documentation

## 2013-07-18 DIAGNOSIS — R197 Diarrhea, unspecified: Secondary | ICD-10-CM | POA: Insufficient documentation

## 2013-07-18 DIAGNOSIS — H5310 Unspecified subjective visual disturbances: Secondary | ICD-10-CM | POA: Insufficient documentation

## 2013-07-18 DIAGNOSIS — H53149 Visual discomfort, unspecified: Secondary | ICD-10-CM | POA: Insufficient documentation

## 2013-07-18 MED ORDER — DIPHENHYDRAMINE HCL 25 MG PO CAPS
50.0000 mg | ORAL_CAPSULE | Freq: Once | ORAL | Status: AC
  Administered 2013-07-18: 50 mg via ORAL
  Filled 2013-07-18: qty 2

## 2013-07-18 MED ORDER — PROCHLORPERAZINE MALEATE 10 MG PO TABS
10.0000 mg | ORAL_TABLET | ORAL | Status: AC
  Administered 2013-07-18: 10 mg via ORAL
  Filled 2013-07-18: qty 1

## 2013-07-18 MED ORDER — DEXAMETHASONE 6 MG PO TABS
6.0000 mg | ORAL_TABLET | Freq: Once | ORAL | Status: AC
  Administered 2013-07-18: 6 mg via ORAL
  Filled 2013-07-18: qty 1

## 2013-07-18 NOTE — Discharge Instructions (Signed)
Headaches, Frequently Asked Questions °MIGRAINE HEADACHES °Q: What is migraine? What causes it? How can I treat it? °A: Generally, migraine headaches begin as a dull ache. Then they develop into a constant, throbbing, and pulsating pain. You may experience pain at the temples. You may experience pain at the front or back of one or both sides of the head. The pain is usually accompanied by a combination of: °· Nausea. °· Vomiting. °· Sensitivity to light and noise. °Some people (about 15%) experience an aura (see below) before an attack. The cause of migraine is believed to be chemical reactions in the brain. Treatment for migraine may include over-the-counter or prescription medications. It may also include self-help techniques. These include relaxation training and biofeedback.  °Q: What is an aura? °A: About 15% of people with migraine get an "aura". This is a sign of neurological symptoms that occur before a migraine headache. You may see wavy or jagged lines, dots, or flashing lights. You might experience tunnel vision or blind spots in one or both eyes. The aura can include visual or auditory hallucinations (something imagined). It may include disruptions in smell (such as strange odors), taste or touch. Other symptoms include: °· Numbness. °· A "pins and needles" sensation. °· Difficulty in recalling or speaking the correct word. °These neurological events may last as long as 60 minutes. These symptoms will fade as the headache begins. °Q: What is a trigger? °A: Certain physical or environmental factors can lead to or "trigger" a migraine. These include: °· Foods. °· Hormonal changes. °· Weather. °· Stress. °It is important to remember that triggers are different for everyone. To help prevent migraine attacks, you need to figure out which triggers affect you. Keep a headache diary. This is a good way to track triggers. The diary will help you talk to your healthcare professional about your condition. °Q: Does  weather affect migraines? °A: Bright sunshine, hot, humid conditions, and drastic changes in barometric pressure may lead to, or "trigger," a migraine attack in some people. But studies have shown that weather does not act as a trigger for everyone with migraines. °Q: What is the link between migraine and hormones? °A: Hormones start and regulate many of your body's functions. Hormones keep your body in balance within a constantly changing environment. The levels of hormones in your body are unbalanced at times. Examples are during menstruation, pregnancy, or menopause. That can lead to a migraine attack. In fact, about three quarters of all women with migraine report that their attacks are related to the menstrual cycle.  °Q: Is there an increased risk of stroke for migraine sufferers? °A: The likelihood of a migraine attack causing a stroke is very remote. That is not to say that migraine sufferers cannot have a stroke associated with their migraines. In persons under age 40, the most common associated factor for stroke is migraine headache. But over the course of a person's normal life span, the occurrence of migraine headache may actually be associated with a reduced risk of dying from cerebrovascular disease due to stroke.  °Q: What are acute medications for migraine? °A: Acute medications are used to treat the pain of the headache after it has started. Examples over-the-counter medications, NSAIDs, ergots, and triptans.  °Q: What are the triptans? °A: Triptans are the newest class of abortive medications. They are specifically targeted to treat migraine. Triptans are vasoconstrictors. They moderate some chemical reactions in the brain. The triptans work on receptors in your brain. Triptans help   to restore the balance of a neurotransmitter called serotonin. Fluctuations in levels of serotonin are thought to be a main cause of migraine.  °Q: Are over-the-counter medications for migraine effective? °A:  Over-the-counter, or "OTC," medications may be effective in relieving mild to moderate pain and associated symptoms of migraine. But you should see your caregiver before beginning any treatment regimen for migraine.  °Q: What are preventive medications for migraine? °A: Preventive medications for migraine are sometimes referred to as "prophylactic" treatments. They are used to reduce the frequency, severity, and length of migraine attacks. Examples of preventive medications include antiepileptic medications, antidepressants, beta-blockers, calcium channel blockers, and NSAIDs (nonsteroidal anti-inflammatory drugs). °Q: Why are anticonvulsants used to treat migraine? °A: During the past few years, there has been an increased interest in antiepileptic drugs for the prevention of migraine. They are sometimes referred to as "anticonvulsants". Both epilepsy and migraine may be caused by similar reactions in the brain.  °Q: Why are antidepressants used to treat migraine? °A: Antidepressants are typically used to treat people with depression. They may reduce migraine frequency by regulating chemical levels, such as serotonin, in the brain.  °Q: What alternative therapies are used to treat migraine? °A: The term "alternative therapies" is often used to describe treatments considered outside the scope of conventional Western medicine. Examples of alternative therapy include acupuncture, acupressure, and yoga. Another common alternative treatment is herbal therapy. Some herbs are believed to relieve headache pain. Always discuss alternative therapies with your caregiver before proceeding. Some herbal products contain arsenic and other toxins. °TENSION HEADACHES °Q: What is a tension-type headache? What causes it? How can I treat it? °A: Tension-type headaches occur randomly. They are often the result of temporary stress, anxiety, fatigue, or anger. Symptoms include soreness in your temples, a tightening band-like sensation  around your head (a "vice-like" ache). Symptoms can also include a pulling feeling, pressure sensations, and contracting head and neck muscles. The headache begins in your forehead, temples, or the back of your head and neck. Treatment for tension-type headache may include over-the-counter or prescription medications. Treatment may also include self-help techniques such as relaxation training and biofeedback. °CLUSTER HEADACHES °Q: What is a cluster headache? What causes it? How can I treat it? °A: Cluster headache gets its name because the attacks come in groups. The pain arrives with little, if any, warning. It is usually on one side of the head. A tearing or bloodshot eye and a runny nose on the same side of the headache may also accompany the pain. Cluster headaches are believed to be caused by chemical reactions in the brain. They have been described as the most severe and intense of any headache type. Treatment for cluster headache includes prescription medication and oxygen. °SINUS HEADACHES °Q: What is a sinus headache? What causes it? How can I treat it? °A: When a cavity in the bones of the face and skull (a sinus) becomes inflamed, the inflammation will cause localized pain. This condition is usually the result of an allergic reaction, a tumor, or an infection. If your headache is caused by a sinus blockage, such as an infection, you will probably have a fever. An x-ray will confirm a sinus blockage. Your caregiver's treatment might include antibiotics for the infection, as well as antihistamines or decongestants.  °REBOUND HEADACHES °Q: What is a rebound headache? What causes it? How can I treat it? °A: A pattern of taking acute headache medications too often can lead to a condition known as "rebound headache."   A pattern of taking too much headache medication includes taking it more than 2 days per week or in excessive amounts. That means more than the label or a caregiver advises. With rebound  headaches, your medications not only stop relieving pain, they actually begin to cause headaches. Doctors treat rebound headache by tapering the medication that is being overused. Sometimes your caregiver will gradually substitute a different type of treatment or medication. Stopping may be a challenge. Regularly overusing a medication increases the potential for serious side effects. Consult a caregiver if you regularly use headache medications more than 2 days per week or more than the label advises. °ADDITIONAL QUESTIONS AND ANSWERS °Q: What is biofeedback? °A: Biofeedback is a self-help treatment. Biofeedback uses special equipment to monitor your body's involuntary physical responses. Biofeedback monitors: °· Breathing. °· Pulse. °· Heart rate. °· Temperature. °· Muscle tension. °· Brain activity. °Biofeedback helps you refine and perfect your relaxation exercises. You learn to control the physical responses that are related to stress. Once the technique has been mastered, you do not need the equipment any more. °Q: Are headaches hereditary? °A: Four out of five (80%) of people that suffer report a family history of migraine. Scientists are not sure if this is genetic or a family predisposition. Despite the uncertainty, a child has a 50% chance of having migraine if one parent suffers. The child has a 75% chance if both parents suffer.  °Q: Can children get headaches? °A: By the time they reach high school, most young people have experienced some type of headache. Many safe and effective approaches or medications can prevent a headache from occurring or stop it after it has begun.  °Q: What type of doctor should I see to diagnose and treat my headache? °A: Start with your primary caregiver. Discuss his or her experience and approach to headaches. Discuss methods of classification, diagnosis, and treatment. Your caregiver may decide to recommend you to a headache specialist, depending upon your symptoms or other  physical conditions. Having diabetes, allergies, etc., may require a more comprehensive and inclusive approach to your headache. The [employer] will provide, upon request, a list of NHF physician members in your state. °Document Released: 09/12/2003 Document Revised: 09/14/2011 Document Reviewed: 02/20/2008 °Exit[employer]® Patient Information ©2014 [employer]. ° °Migraine Headache °A migraine headache is an intense, throbbing pain on one or both sides of your head. A migraine can last for 30 minutes to several hours. °CAUSES  °The exact cause of a migraine headache is not always known. However, a migraine may be caused when nerves in the brain become irritated and release chemicals that cause inflammation. This causes pain. °Certain things may also trigger migraines, such as: °· Alcohol. °· Smoking. °· Stress. °· Menstruation. °· Aged cheeses. °· Foods or drinks that contain nitrates, glutamate, aspartame, or tyramine. °· Lack of sleep. °· Chocolate. °· Caffeine. °· Hunger. °· Physical exertion. °· Fatigue. °· Medicines used to treat chest pain (nitroglycerine), birth control pills, estrogen, and some blood pressure medicines. °SIGNS AND SYMPTOMS °· Pain on one or both sides of your head. °· Pulsating or throbbing pain. °· Severe pain that prevents daily activities. °· Pain that is aggravated by any physical activity. °· Nausea, vomiting, or both. °· Dizziness. °· Pain with exposure to bright lights, loud noises, or activity. °· General sensitivity to bright lights, loud noises, or smells. °Before you get a migraine, you may get warning signs that a migraine is coming (aura). An aura may include: °·   Seeing flashing lights.  Seeing bright spots, halos, or zig-zag lines.  Having tunnel vision or blurred vision.  Having feelings of numbness or tingling.  Having trouble talking.  Having muscle weakness. DIAGNOSIS  A migraine headache is often diagnosed based on:  Symptoms.  Physical  exam.  A CT scan or MRI of your head. These imaging tests cannot diagnose migraines, but they can help rule out other causes of headaches. TREATMENT Medicines may be given for pain and nausea. Medicines can also be given to help prevent recurrent migraines.  HOME CARE INSTRUCTIONS  Only take over-the-counter or prescription medicines for pain or discomfort as directed by your health care provider. The use of long-term narcotics is not recommended.  Lie down in a dark, quiet room when you have a migraine.  Keep a journal to find out what may trigger your migraine headaches. For example, write down:  What you eat and drink.  How much sleep you get.  Any change to your diet or medicines.  Limit alcohol consumption.  Quit smoking if you smoke.  Get 7 9 hours of sleep, or as recommended by your health care provider.  Limit stress.  Keep lights dim if bright lights bother you and make your migraines worse. SEEK IMMEDIATE MEDICAL CARE IF:   Your migraine becomes severe.  You have a fever.  You have a stiff neck.  You have vision loss.  You have muscular weakness or loss of muscle control.  You start losing your balance or have trouble walking.  You feel faint or pass out.  You have severe symptoms that are different from your first symptoms. MAKE SURE YOU:   Understand these instructions.  Will watch your condition.  Will get help right away if you are not doing well or get worse. Document Released: 06/22/2005 Document Revised: 04/12/2013 Document Reviewed: 02/27/2013 [facility] Patient Information 2014 [place], [place].  Arterial Hypertension Arterial hypertension (high blood pressure) is a condition of elevated pressure in your blood vessels. Hypertension over a long period of time is a risk factor for strokes, heart attacks, and heart failure. It is also the leading cause of kidney (renal) failure.  CAUSES   In Adults -- Over 90% of all hypertension has no known  cause. This is called essential or primary hypertension. In the other 10% of people with hypertension, the increase in blood pressure is caused by another disorder. This is called secondary hypertension. Important causes of secondary hypertension are:  Heavy alcohol use.  Obstructive sleep apnea.  Hyperaldosterosim (Conn's syndrome).  Steroid use.  Chronic kidney failure.  Hyperparathyroidism.  Medications.  Renal artery stenosis.  Pheochromocytoma.  Cushing's disease.  Coarctation of the aorta.  Scleroderma renal crisis.  Licorice (in excessive amounts).  Drugs (cocaine, methamphetamine). Your caregiver can explain any items above that apply to you.  In Children -- Secondary hypertension is more common and should always be considered.  Pregnancy -- Few women of childbearing age have high blood pressure. However, up to 10% of them develop hypertension of pregnancy. Generally, this will not harm the woman. It may be a sign of 3 complications of pregnancy: preeclampsia, HELLP syndrome, and eclampsia. Follow up and control with medication is necessary. SYMPTOMS   This condition normally does not produce any noticeable symptoms. It is usually found during a routine exam.  Malignant hypertension is a late problem of high blood pressure. It may have the following symptoms:  Headaches.  Blurred vision.  End-organ damage (this means your kidneys, heart,  lungs, and other organs are being damaged).  Stressful situations can increase the blood pressure. If a person with normal blood pressure has their blood pressure go up while being seen by their caregiver, this is often termed "white coat hypertension." Its importance is not known. It may be related with eventually developing hypertension or complications of hypertension.  Hypertension is often confused with mental tension, stress, and anxiety. DIAGNOSIS  The diagnosis is made by 3 separate blood pressure measurements. They  are taken at least 1 week apart from each other. If there is organ damage from hypertension, the diagnosis may be made without repeat measurements. Hypertension is usually identified by having blood pressure readings:  Above 140/90 mmHg measured in both arms, at 3 separate times, over a couple weeks.  Over 130/80 mmHg should be considered a risk factor and may require treatment in patients with diabetes. Blood pressure readings over 120/80 mmHg are called "pre-hypertension" even in non-diabetic patients. To get a true blood pressure measurement, use the following guidelines. Be aware of the factors that can alter blood pressure readings.  Take measurements at least 1 hour after caffeine.  Take measurements 30 minutes after smoking and without any stress. This is another reason to quit smoking  it raises your blood pressure.  Use a proper cuff size. Ask your caregiver if you are not sure about your cuff size.  Most home blood pressure cuffs are automatic. They will measure systolic and diastolic pressures. The systolic pressure is the pressure reading at the start of sounds. Diastolic pressure is the pressure at which the sounds disappear. If you are elderly, measure pressures in multiple postures. Try sitting, lying or standing.  Sit at rest for a minimum of 5 minutes before taking measurements.  You should not be on any medications like decongestants. These are found in many cold medications.  Record your blood pressure readings and review them with your caregiver. If you have hypertension:  Your caregiver may do tests to be sure you do not have secondary hypertension (see "causes" above).  Your caregiver may also look for signs of metabolic syndrome. This is also called Syndrome X or Insulin Resistance Syndrome. You may have this syndrome if you have type 2 diabetes, abdominal obesity, and abnormal blood lipids in addition to hypertension.  Your caregiver will take your medical and  family history and perform a physical exam.  Diagnostic tests may include blood tests (for glucose, cholesterol, potassium, and kidney function), a urinalysis, or an EKG. Other tests may also be necessary depending on your condition. PREVENTION  There are important lifestyle issues that you can adopt to reduce your chance of developing hypertension:  Maintain a normal weight.  Limit the amount of salt (sodium) in your diet.  Exercise often.  Limit alcohol intake.  Get enough potassium in your diet. Discuss specific advice with your caregiver.  Follow a DASH diet (dietary approaches to stop hypertension). This diet is rich in fruits, vegetables, and low-fat dairy products, and avoids certain fats. PROGNOSIS  Essential hypertension cannot be cured. Lifestyle changes and medical treatment can lower blood pressure and reduce complications. The prognosis of secondary hypertension depends on the underlying cause. Many people whose hypertension is controlled with medicine or lifestyle changes can live a normal, healthy life.  RISKS AND COMPLICATIONS  While high blood pressure alone is not an illness, it often requires treatment due to its short- and long-term effects on many organs. Hypertension increases your risk for:  CVAs or strokes (  cerebrovascular accident).  Heart failure due to chronically high blood pressure (hypertensive cardiomyopathy).  Heart attack (myocardial infarction).  Damage to the retina (hypertensive retinopathy).  Kidney failure (hypertensive nephropathy). Your caregiver can explain list items above that apply to you. Treatment of hypertension can significantly reduce the risk of complications. TREATMENT   For overweight patients, weight loss and regular exercise are recommended. Physical fitness lowers blood pressure.  Mild hypertension is usually treated with diet and exercise. A diet rich in fruits and vegetables, fat-free dairy products, and foods low in fat and  salt (sodium) can help lower blood pressure. Decreasing salt intake decreases blood pressure in a 1/3 of people.  Stop smoking if you are a smoker. The steps above are highly effective in reducing blood pressure. While these actions are easy to suggest, they are difficult to achieve. Most patients with moderate or severe hypertension end up requiring medications to bring their blood pressure down to a normal level. There are several classes of medications for treatment. Blood pressure pills (antihypertensives) will lower blood pressure by their different actions. Lowering the blood pressure by 10 mmHg may decrease the risk of complications by as much as 25%. The goal of treatment is effective blood pressure control. This will reduce your risk for complications. Your caregiver will help you determine the best treatment for you according to your lifestyle. What is excellent treatment for one person, may not be for you. HOME CARE INSTRUCTIONS   Do not smoke.  Follow the lifestyle changes outlined in the "Prevention" section.  If you are on medications, follow the directions carefully. Blood pressure medications must be taken as prescribed. Skipping doses reduces their benefit. It also puts you at risk for problems.  Follow up with your caregiver, as directed.  If you are asked to monitor your blood pressure at home, follow the guidelines in the "Diagnosis" section above. SEEK MEDICAL CARE IF:   You think you are having medication side effects.  You have recurrent headaches or lightheadedness.  You have swelling in your ankles.  You have trouble with your vision. SEEK IMMEDIATE MEDICAL CARE IF:   You have sudden onset of chest pain or pressure, difficulty breathing, or other symptoms of a heart attack.  You have a severe headache.  You have symptoms of a stroke (such as sudden weakness, difficulty speaking, difficulty walking). MAKE SURE YOU:   Understand these instructions.  Will  watch your condition.  Will get help right away if you are not doing well or get worse. Document Released: 06/22/2005 Document Revised: 09/14/2011 Document Reviewed: 01/20/2007 [facility] Patient Information 2014 [place], [place].

## 2013-07-18 NOTE — ED Notes (Signed)
Report given to Carla, RN

## 2013-07-18 NOTE — ED Notes (Signed)
Pt. Has a 3 day c/o Ha, n/v/d and left ear pain. Pt. Denies fever or sick contacts.

## 2013-07-18 NOTE — ED Provider Notes (Signed)
Medical screening examination/treatment/procedure(s) were conducted as a shared visit with non-physician practitioner(s) and myself.  I personally evaluated the patient during the encounter.  EKG Interpretation   None       Patient here with headache. History of migraines. No fever, no dizziness. Mild blurry vision, photophobia. Patient improving after migraine cocktail. Doubt SAH, looks very well. Stable for discharge.  Dagmar Hait, MD 07/18/13 [id number]

## 2013-07-18 NOTE — ED Provider Notes (Signed)
CSN: [id number]     Arrival date & time 07/18/13  0802 History   First MD Initiated Contact with Patient 07/18/13 0804     Chief Complaint  Patient presents with  . Headache  . Otalgia  . Diarrhea  . Emesis   (Consider location/radiation/quality/duration/timing/severity/associated sxs/prior Treatment) HPI Comments: Patient is a 28 year old female with history of hypertension who presents today with 3 days of gradually worsening headache, nausea, vomiting, diarrhea. She reports that her headaches have been on the left side of her face and radiate into her neck. They are intermittent, throbbing pain. Yesterday she began to have left ear pain. She has taken Percocet and ibuprofen without relief. He has been having nausea, vomiting, diarrhea which she associates with her pain. She reports blurry vision and photophobia. Initially she thought she was developing a migraine, but the left ear pain is different than her normal migraines. She denies any fever, chills, shortness of breath, chest pain, numbness, weakness.  The history is provided by the patient. No language interpreter was used.    Past Medical History  Diagnosis Date  . Hypertension    Past Surgical History  Procedure Laterality Date  . No past surgeries     Family History  Problem Relation Age of Onset  . Hypertension Mother   . Hypertension Father    History  Substance Use Topics  . Smoking status: Never Smoker   . Smokeless tobacco: Never Used  . Alcohol Use: No   OB History   Grav Para Term Preterm Abortions TAB SAB Ect Mult Living   1 1 1  0 0 0 0 0 0 1     Review of Systems  Constitutional: Negative for fever and chills.  HENT: Positive for ear pain. Negative for congestion, rhinorrhea and sore throat.   Eyes: Positive for photophobia and visual disturbance.  Respiratory: Negative for shortness of breath.   Cardiovascular: Negative for chest pain.  Gastrointestinal: Positive for nausea, vomiting and diarrhea.  Negative for abdominal pain.  Neurological: Positive for headaches. Negative for weakness and numbness.  All other systems reviewed and are negative.    Allergies  Review of patient's allergies indicates no known allergies.  Home Medications   Current Outpatient Rx  Name  Route  Sig  Dispense  Refill  . ibuprofen (ADVIL,MOTRIN) 200 MG tablet   Oral   Take 600 mg by mouth once as needed (pain).         Marland Kitchen. ibuprofen (ADVIL,MOTRIN) 800 MG tablet   Oral   Take 1 tablet (800 mg total) by mouth every 8 (eight) hours as needed for pain.   50 tablet   1   . NIFEdipine (PROCARDIA XL) 30 MG 24 hr tablet   Oral   Take 1 tablet (30 mg total) by mouth daily.   30 tablet   2   . oxyCODONE-acetaminophen (ROXICET) 5-325 MG per tablet   Oral   Take 1 tablet by mouth every 4 (four) hours as needed for pain.   30 tablet   0    BP 150/101  Pulse 88  Temp(Src) 97.6 F (36.4 C) (Oral)  Resp 18  SpO2 100%  LMP 07/18/2013 Physical Exam  Nursing note and vitals reviewed. Constitutional: She is oriented to person, place, and time. She appears well-developed and well-nourished. No distress.  HENT:  Head: Normocephalic and atraumatic.  Right Ear: Tympanic membrane, external ear and ear canal normal. No mastoid tenderness.  Left Ear: Tympanic membrane, external ear and ear  canal normal. No mastoid tenderness.  Nose: Nose normal. Right sinus exhibits no maxillary sinus tenderness and no frontal sinus tenderness. Left sinus exhibits no maxillary sinus tenderness and no frontal sinus tenderness.  Mouth/Throat: Oropharynx is clear and moist.  No mastoid tenderness or erythema No temporal artery tenderness  Eyes: Conjunctivae and EOM are normal. Pupils are equal, round, and reactive to light.  Neck: Normal range of motion.  No nuchal rigidity or meningeal signs  Cardiovascular: Normal rate, regular rhythm, normal heart sounds, intact distal pulses and normal pulses.   Pulmonary/Chest: Effort  normal and breath sounds normal. No stridor. No respiratory distress. She has no wheezes. She has no rales.  Abdominal: Soft. She exhibits no distension. There is no tenderness. There is no rigidity and no guarding.  Musculoskeletal: Normal range of motion.  Neurological: She is alert and oriented to person, place, and time. She has normal strength.  Grip strength 5/5 bilaterally. Finger nose finger normal.   Skin: Skin is warm and dry. She is not diaphoretic. No erythema.  Psychiatric: She has a normal mood and affect. Her behavior is normal.    ED Course  Procedures (including critical care time) Labs Review Labs Reviewed - No data to display Imaging Review No results found.  EKG Interpretation   None       MDM   1. Migraine   2. Hypertension    Pt HA treated and improved while in ED.  Presentation is like pts typical HA and non concerning for [facility], [facility], Meningitis, or temporal arteritis. Pt is afebrile with no focal neuro deficits, nuchal rigidity, or change in vision. Pt is to follow up with PCP to discuss prophylactic medication. Pt understands that today her BP was elevated. Discussed that she will need to address this with her PCP as well. No neuro deficits or signs of end organ damage at this time. Pt verbalizes understanding and is agreeable with plan to dc. Dr. Gwendolyn Grant evaluated this patient and agrees with plan. Patient / Family / Caregiver informed of clinical course, understand medical decision-making process, and agree with plan.  Medications  prochlorperazine (COMPAZINE) tablet 10 mg (10 mg Oral Given 07/18/13 0920)  diphenhydrAMINE (BENADRYL) capsule 50 mg (50 mg Oral Given 07/18/13 1010)  dexamethasone (DECADRON) tablet 6 mg (6 mg Oral Given 07/18/13 1010)        Mora Bellman, PA-C 07/18/13 1421

## 2013-09-29 ENCOUNTER — Emergency Department (HOSPITAL_COMMUNITY)
Admission: EM | Admit: 2013-09-29 | Discharge: 2013-09-29 | Disposition: A | Discharge status: Home / Self Care | Payer: 59 | Attending: Emergency Medicine | Admitting: Emergency Medicine

## 2013-09-29 ENCOUNTER — Emergency Department (HOSPITAL_COMMUNITY): Payer: 59

## 2013-09-29 ENCOUNTER — Encounter (HOSPITAL_COMMUNITY): Payer: Self-pay | Admitting: Emergency Medicine

## 2013-09-29 DIAGNOSIS — R079 Chest pain, unspecified: Secondary | ICD-10-CM

## 2013-09-29 DIAGNOSIS — R109 Unspecified abdominal pain: Secondary | ICD-10-CM

## 2013-09-29 DIAGNOSIS — Z3202 Encounter for pregnancy test, result negative: Secondary | ICD-10-CM | POA: Insufficient documentation

## 2013-09-29 DIAGNOSIS — H53149 Visual discomfort, unspecified: Secondary | ICD-10-CM | POA: Insufficient documentation

## 2013-09-29 DIAGNOSIS — R42 Dizziness and giddiness: Secondary | ICD-10-CM | POA: Insufficient documentation

## 2013-09-29 DIAGNOSIS — R1013 Epigastric pain: Secondary | ICD-10-CM | POA: Insufficient documentation

## 2013-09-29 DIAGNOSIS — Z79899 Other long term (current) drug therapy: Secondary | ICD-10-CM | POA: Insufficient documentation

## 2013-09-29 DIAGNOSIS — R519 Headache, unspecified: Secondary | ICD-10-CM

## 2013-09-29 DIAGNOSIS — R112 Nausea with vomiting, unspecified: Secondary | ICD-10-CM | POA: Insufficient documentation

## 2013-09-29 DIAGNOSIS — R51 Headache: Secondary | ICD-10-CM | POA: Insufficient documentation

## 2013-09-29 DIAGNOSIS — R5381 Other malaise: Secondary | ICD-10-CM | POA: Insufficient documentation

## 2013-09-29 DIAGNOSIS — R5383 Other fatigue: Secondary | ICD-10-CM

## 2013-09-29 DIAGNOSIS — R0789 Other chest pain: Secondary | ICD-10-CM | POA: Insufficient documentation

## 2013-09-29 DIAGNOSIS — I10 Essential (primary) hypertension: Secondary | ICD-10-CM

## 2013-09-29 LAB — URINALYSIS, ROUTINE W REFLEX MICROSCOPIC
BILIRUBIN URINE: NEGATIVE
Glucose, UA: NEGATIVE mg/dL
Hgb urine dipstick: NEGATIVE
KETONES UR: NEGATIVE mg/dL
Leukocytes, UA: NEGATIVE
Nitrite: NEGATIVE
PH: 6.5 (ref 5.0–8.0)
Protein, ur: NEGATIVE mg/dL
Specific Gravity, Urine: 1.025 (ref 1.005–1.030)
Urobilinogen, UA: 1 mg/dL (ref 0.0–1.0)

## 2013-09-29 LAB — COMPREHENSIVE METABOLIC PANEL
ALBUMIN: 4.3 g/dL (ref 3.5–5.2)
ALK PHOS: 73 U/L (ref 39–117)
ALT: 22 U/L (ref 0–35)
AST: 24 U/L (ref 0–37)
BILIRUBIN TOTAL: 0.9 mg/dL (ref 0.3–1.2)
BUN: 11 mg/dL (ref 6–23)
CHLORIDE: 100 meq/L (ref 96–112)
CO2: 24 mEq/L (ref 19–32)
Calcium: 9.6 mg/dL (ref 8.4–10.5)
Creatinine, Ser: 0.74 mg/dL (ref 0.50–1.10)
GFR calc Af Amer: >90 mL/min (ref >90)
GFR calc non Af Amer: >90 mL/min (ref >90)
GLUCOSE: 89 mg/dL (ref 70–99)
Potassium: 4 mEq/L (ref 3.7–5.3)
SODIUM: 139 meq/L (ref 137–147)
TOTAL PROTEIN: 8.1 g/dL (ref 6.0–8.3)

## 2013-09-29 LAB — CBC WITH DIFFERENTIAL/PLATELET
Basophils Absolute: 0 10*3/uL (ref 0.0–0.1)
Basophils Relative: 0 % (ref 0–1)
EOS ABS: 0.1 10*3/uL (ref 0.0–0.7)
Eosinophils Relative: 2 % (ref 0–5)
HCT: 40.5 % (ref 36.0–46.0)
HEMOGLOBIN: 13.9 g/dL (ref 12.0–15.0)
Lymphocytes Relative: 35 % (ref 12–46)
Lymphs Abs: 2.8 10*3/uL (ref 0.7–4.0)
MCH: 30.8 pg (ref 26.0–34.0)
MCHC: 34.3 g/dL (ref 30.0–36.0)
MCV: 89.8 fL (ref 78.0–100.0)
Monocytes Absolute: 0.4 10*3/uL (ref 0.1–1.0)
Monocytes Relative: 5 % (ref 3–12)
NEUTROS PCT: 59 % (ref 43–77)
Neutro Abs: 4.7 10*3/uL (ref 1.7–7.7)
PLATELETS: 362 10*3/uL (ref 150–400)
RBC: 4.51 MIL/uL (ref 3.87–5.11)
RDW: 12.9 % (ref 11.5–15.5)
WBC: 8.1 10*3/uL (ref 4.0–10.5)

## 2013-09-29 LAB — LIPASE, BLOOD: LIPASE: 30 U/L (ref 11–59)

## 2013-09-29 LAB — I-STAT TROPONIN, ED: TROPONIN I, POC: 0 ng/mL (ref 0.00–0.08)

## 2013-09-29 LAB — PREGNANCY, URINE: PREG TEST UR: NEGATIVE

## 2013-09-29 LAB — POC URINE PREG, ED: Preg Test, Ur: NEGATIVE

## 2013-09-29 LAB — SEDIMENTATION RATE: SED RATE: 10 mm/h (ref 0–22)

## 2013-09-29 MED ORDER — OMEPRAZOLE 20 MG PO CPDR: 20.0000 mg | DELAYED_RELEASE_CAPSULE | Freq: Every day | ORAL | Status: DC

## 2013-09-29 MED ORDER — GI COCKTAIL ~~LOC~~
30.0000 mL | Freq: Once | ORAL | Status: AC
Start: 2013-09-29 — End: 2013-09-29
  Administered 2013-09-29: 30 mL via ORAL
  Filled 2013-09-29: qty 30

## 2013-09-29 MED ORDER — METOCLOPRAMIDE HCL 5 MG/ML IJ SOLN
10.0000 mg | Freq: Once | INTRAMUSCULAR | Status: AC
  Administered 2013-09-29: 10 mg via INTRAMUSCULAR
  Filled 2013-09-29: qty 2

## 2013-09-29 MED ORDER — DIPHENHYDRAMINE HCL 50 MG/ML IJ SOLN
25.0000 mg | Freq: Once | INTRAMUSCULAR | Status: AC
  Administered 2013-09-29: 25 mg via INTRAMUSCULAR
  Filled 2013-09-29: qty 1

## 2013-09-29 MED ORDER — DEXAMETHASONE SODIUM PHOSPHATE 10 MG/ML IJ SOLN
10.0000 mg | Freq: Once | INTRAMUSCULAR | Status: AC
  Administered 2013-09-29: 10 mg via INTRAMUSCULAR
  Filled 2013-09-29: qty 1

## 2013-09-29 NOTE — Discharge Instructions (Signed)
Take your blood pressure medication as prescribed. Follow up with a primary care doctor for a blood pressure recheck in 48 hours. Take prilosec as prescribed as your abdominal and chest pain may be the result of reflux or a gastric ulcer. Follow up with a primary care doctor to discuss your symptoms for this as well. Return if symptoms worsen.  Hypertension Hypertension is another name for high blood pressure. High blood pressure may mean that your heart needs to work harder to pump blood. Blood pressure consists of two numbers, which includes a higher number over a lower number (example: 110/72). HOME CARE   Make lifestyle changes as told by your doctor. This may include weight loss and exercise.  Take your blood pressure medicine every day.  Limit how much salt you use.  Stop smoking if you smoke.  Do not use drugs.  Talk to your doctor if you are using decongestants or birth control pills. These medicines might make blood pressure higher.  Females should not drink more than 1 alcoholic drink per day. Males should not drink more than 2 alcoholic drinks per day.  See your doctor as told. GET HELP RIGHT AWAY IF:   You have a blood pressure reading with a top number of 180 or higher.  You get a very bad headache.  You get blurred or changing vision.  You feel confused.  You feel weak, numb, or faint.  You get chest or belly (abdominal) pain.  You throw up (vomit).  You cannot breathe very well. MAKE SURE YOU:   Understand these instructions.  Will watch your condition.  Will get help right away if you are not doing well or get worse. Document Released: 12/09/2007 Document Revised: 09/14/2011 Document Reviewed: 12/09/2007 [facility] Patient Information 2014 [place], [place]. Peptic Ulcer A peptic ulcer is a painful sore in the lining of in your esophagus, stomach, or in the first part of your small intestine. The main causes of an ulcer can be:  An infection.  Using  certain pain medicines too often or too much.  Smoking. HOME CARE  Avoid smoking, alcohol, and caffeine.  Avoid foods that bother you.  Only take medicine as told by your doctor. Do not take any medicines your doctor has not approved.  Keep all doctor visits as told. GET HELP RIGHT AWAY IF:  You do not get better in 7 days after starting treatment.  You keep having an upset stomach (indigestion) or heartburn.  You have sudden, sharp, or lasting belly (abdominal) pain.  You have bloody, black, or tarry poop (stool).  You throw up (vomit) blood or your throw up looks like coffee grounds.  You get light headed, weak, or feel like you will pass out (faint).  You get sweaty or feel sticky and cold to the touch (clammy). MAKE SURE YOU:   Understand these instructions.  Will watch your condition.  Will get help right away if you are not doing well or get worse. Document Released: 09/16/2009 Document Revised: 03/16/2012 Document Reviewed: 01/20/2012 [facility] Patient Information 2014 [place], [place].

## 2013-09-29 NOTE — ED Provider Notes (Signed)
CSN: [id number]     Arrival date & time 09/29/13  1812 History   First MD Initiated Contact with Patient 09/29/13 2009     Chief Complaint  Patient presents with  . Chest Pain  . Abdominal Pain     (Consider location/radiation/quality/duration/timing/severity/associated sxs/prior Treatment) HPI Comments: Patient is a 28 year old female and history of hypertension, currently on daily Nifedipine, who presents to the emergency department for multiple complaints. Patient states that a few days ago she noted a bilateral temporal headache. Headache has been constant this time and not radiating. She describes the pain as a dull pain with intermittent sharp sensation. Patient states that headache was gradual in onset and has not responded to rest or ibuprofen. It is associated with intermittent lightheadedness, fatigue, photophobia and phonophobia. She states that the next day she noticed a chest pain which she describes as a soreness. Pain is located in her central chest and is nonradiating. Chest pain is worse with deep breathing. She also developed a intermittent pain in her epigastric region but says he did nonbloody/nonbilious emesis. Patient states that that has no provoking factors. She states she is able to tolerate food and fluid intermittently without nausea or emesis. Patient denies fever, hematemesis, urinary symptoms, diarrhea, melena, hematochezia, vaginal complaints, jaw pain, numbness/tingling, and LOC. She denies a hx of abdominal surgeries.  Patient is a 28 y.o. female presenting with chest pain and abdominal pain. The history is provided by the patient. No language interpreter was used.  Chest Pain Associated symptoms: abdominal pain, fatigue, headache, nausea and vomiting   Associated symptoms: no dysphagia, no fever, no numbness, no shortness of breath and no weakness   Abdominal Pain Associated symptoms: chest pain, fatigue, nausea and vomiting   Associated symptoms: no constipation,  no diarrhea, no dysuria, no fever and no shortness of breath     Past Medical History  Diagnosis Date  . Hypertension    Past Surgical History  Procedure Laterality Date  . No past surgeries     Family History  Problem Relation Age of Onset  . Hypertension Mother   . Hypertension Father    History  Substance Use Topics  . Smoking status: Never Smoker   . Smokeless tobacco: Never Used  . Alcohol Use: No   OB History   Grav Para Term Preterm Abortions TAB SAB Ect Mult Living   1 1 1  0 0 0 0 0 0 1     Review of Systems  Constitutional: Positive for fatigue. Negative for fever and appetite change.  HENT: Negative for trouble swallowing.   Eyes: Positive for photophobia. Negative for visual disturbance.  Respiratory: Negative for shortness of breath.   Cardiovascular: Positive for chest pain.  Gastrointestinal: Positive for nausea, vomiting and abdominal pain. Negative for diarrhea, constipation and blood in stool.  Genitourinary: Negative for dysuria and urgency.  Neurological: Positive for light-headedness and headaches. Negative for syncope, weakness and numbness.  All other systems reviewed and are negative.      Allergies  Review of patient's allergies indicates no known allergies.  Home Medications   Current Outpatient Rx  Name  Route  Sig  Dispense  Refill  . acetaminophen (TYLENOL) 500 MG tablet   Oral   Take 1,000 mg by mouth every 6 (six) hours as needed for headache.         Marland Kitchen NIFEdipine (PROCARDIA XL) 30 MG 24 hr tablet   Oral   Take 1 tablet (30 mg total) by mouth daily.  30 tablet   2   . omeprazole (PRILOSEC) 20 MG capsule   Oral   Take 1 capsule (20 mg total) by mouth daily.   30 capsule   0    BP 133/96  Pulse 91  Temp(Src) 98.4 F (36.9 C) (Oral)  Resp 16  Ht 5\' 7"  (1.702 m)  Wt 166 lb (75.297 kg)  BMI 25.99 kg/m2  SpO2 99%  LMP 09/11/2013  Physical Exam  Nursing note and vitals reviewed. Constitutional: She is oriented to  person, place, and time. She appears well-developed and well-nourished. No distress.  HENT:  Head: Normocephalic and atraumatic.  Mouth/Throat: Oropharynx is clear and moist. No oropharyngeal exudate.  Eyes: Conjunctivae and EOM are normal. Pupils are equal, round, and reactive to light. No scleral icterus.  Neck: Normal range of motion. Neck supple.  Cardiovascular: Normal rate, regular rhythm and normal heart sounds.   Pulmonary/Chest: Effort normal and breath sounds normal. No respiratory distress. She has no wheezes. She has no rales. She exhibits tenderness (to palpation of central chest).  Poor effort  Abdominal: Soft. She exhibits no distension and no mass. There is tenderness (mild, epigastric). There is no rebound and no guarding.  Mild focal tenderness to deep palpation of the epigastric region. No peritoneal signs.  Musculoskeletal: Normal range of motion.  Neurological: She is alert and oriented to person, place, and time. No cranial nerve deficit. She exhibits normal muscle tone. Coordination normal.  GCS 15. Speech is goal oriented. No focal neurologic deficits appreciated; no facial drooping and symmetric eyebrow raise. Patient moves extremities without ataxia. No gross sensory deficits.  Skin: Skin is warm and dry. No rash noted. She is not diaphoretic. No erythema. No pallor.  Psychiatric: She has a normal mood and affect. Her behavior is normal.    ED Course  Procedures (including critical care time) Labs Review Labs Reviewed  CBC WITH DIFFERENTIAL  COMPREHENSIVE METABOLIC PANEL  LIPASE, BLOOD  URINALYSIS, ROUTINE W REFLEX MICROSCOPIC  PREGNANCY, URINE  SEDIMENTATION RATE  POC URINE PREG, ED  Rosezena Sensor, ED   Imaging Review Dg Chest 2 View  09/29/2013   CLINICAL DATA:  Shortness of breath.  EXAM: CHEST  2 VIEW  COMPARISON:  DG CHEST 2 VIEW dated 06/24/2009  FINDINGS: Cardiomediastinal silhouette is unremarkable. The lungs are clear without pleural effusions or  focal consolidations. Trachea projects midline and there is no pneumothorax. Soft tissue planes and included osseous structures are non-suspicious. Multiple EKG lines overlie the patient and may obscure subtle underlying pathology.  IMPRESSION: No acute cardiopulmonary process ; normal chest radiograph.   Electronically Signed   By: Awilda Metro   On: 09/29/2013 21:04     Date: 09/29/2013  Rate: 85  Rhythm: normal sinus rhythm  QRS Axis: normal  Intervals: normal  ST/T Wave abnormalities: nonspecific ST changes  Conduction Disutrbances:none  Narrative Interpretation: NSR; no STEMI or ischemic change.  Old EKG Reviewed: none available I have personally reviewed and interpreted this EKG   MDM   Final diagnoses:  Chest pain  Abdominal pain  Headache  Hypertension    28 year old female presents to the emergency department for headache, chest discomfort, and abdominal pain. Patient is well and nontoxic appearing, hemodynamically stable, and afebrile. Physical exam significant only for mild tenderness to the epigastric region upon deep palpation of the abdomen. No peritoneal signs. Neurologic exam nonfocal. Patient today with no leukocytosis, anemia, or electrolyte imbalance. Liver and kidney function preserved. Lipase WNL. Patient also has a  normal sedimentation rate today. Urinalysis did not suggest infection in urine pregnancy negative. Cardiac workup unremarkable.  Patient treated in the ED with GI cocktail, Decadron, Reglan, and Benadryl. Patient endorses resolution of her headache, chest pain, and abdominal pain with this treatment. Abdominal reexamination improved, without focal tenderness. Neurologic exam has remained stable. Emergent intracranial process given normal neurologic exam and improvement in symptoms. Suspect headache to be secondary to HTN or tension/stress. Doubt ACS given negative work up and atypical nature of symptoms. Suspect chest pain and abdominal pain to be  secondary to reflux of gastric ulcer. Do not believe further emergent w/u is indicated. Patient stable for d/c with Rx for omeprazole and referral to [facility] for PCP f/u. Return precautions provided and patient agreeable to plan with no unaddressed concerns.  Filed Vitals:   09/29/13 2002 09/29/13 2015 09/29/13 2030 09/29/13 2134  BP: 145/108 144/99 160/102 133/96  Pulse:  84 71 91  Temp:    98.4 F (36.9 C)  TempSrc:    Oral  Resp:  16 11 16   Height:      Weight:      SpO2:  100% 100% 99%      Antony MaduraKelly Tymia Streb, PA-C 09/29/13 2220

## 2013-09-29 NOTE — ED Notes (Signed)
Pt reports that she started having chest pain and abd pain yesterday. Reports she has a hx of HTN after having her baby. States her BP was 140/100 yesterday when checked. C/o n/v this morning.

## 2013-10-01 NOTE — ED Provider Notes (Signed)
Medical screening examination/treatment/procedure(s) were performed by non-physician practitioner and as supervising physician I was immediately available for consultation/collaboration.   EKG Interpretation   Date/Time:  Friday September 29 2013 18:19:24 EDT Ventricular Rate:  85 PR Interval:  134 QRS Duration: 78 QT Interval:  370 QTC Calculation: 440 R Axis:   4 Text Interpretation:  Normal sinus rhythm with sinus arrhythmia Anterior  infarct , age undetermined Abnormal ECG ED PHYSICIAN INTERPRETATION  AVAILABLE IN [facility] Confirmed by TEST, Record ([id number]) on  10/01/2013 12:52:06 PM        Gilda Crease, MD 10/01/13 [phone]

## 2014-02-02 ENCOUNTER — Ambulatory Visit: Payer: 59 | Admitting: Family

## 2014-02-21 ENCOUNTER — Encounter: Payer: Self-pay | Admitting: Family

## 2014-02-21 ENCOUNTER — Ambulatory Visit (INDEPENDENT_AMBULATORY_CARE_PROVIDER_SITE_OTHER): Payer: 59 | Admitting: Family

## 2014-02-21 VITALS — BP 130/100 | HR 101 | Temp 98.5°F | Ht 62.0 in | Wt 155.0 lb

## 2014-02-21 DIAGNOSIS — I10 Essential (primary) hypertension: Secondary | ICD-10-CM

## 2014-02-21 DIAGNOSIS — Z3009 Encounter for other general counseling and advice on contraception: Secondary | ICD-10-CM

## 2014-02-21 MED ORDER — LEVONORGESTREL-ETHINYL ESTRAD 0.15-30 MG-MCG PO TABS: 1.0000 | ORAL_TABLET | Freq: Every day | ORAL | Status: DC

## 2014-02-21 NOTE — Progress Notes (Signed)
Pre visit review using our clinic review tool, if applicable. No additional management support is needed unless otherwise documented below in the visit note. 

## 2014-02-21 NOTE — Patient Instructions (Signed)
Cardiac Diet This diet can help prevent heart disease and stroke. Many factors influence your heart health, including eating and exercise habits. Coronary risk rises a lot with abnormal blood fat (lipid) levels. Cardiac meal planning includes limiting unhealthy fats, increasing healthy fats, and making other small dietary changes. General guidelines are as follows:  Adjust calorie intake to reach and maintain desirable body weight.  Limit total fat intake to less than 30% of total calories. Saturated fat should be less than 7% of calories.  Saturated fats are found in animal products and in some vegetable products. Saturated vegetable fats are found in coconut oil, cocoa butter, palm oil, and palm kernel oil. Read labels carefully to avoid these products as much as possible. Use butter in moderation. Choose tub margarines and oils that have 2 grams of fat or less. Good cooking oils are canola and olive oils.  Practice low-fat cooking techniques. Do not fry food. Instead, broil, bake, boil, steam, grill, roast on a rack, stir-fry, or microwave it. Other fat reducing suggestions include:  Remove the skin from poultry.  Remove all visible fat from meats.  Skim the fat off stews, soups, and gravies before serving them.  Steam vegetables in water or broth instead of sauting them in fat.  Avoid foods with trans fat (or hydrogenated oils), such as commercially fried foods and commercially baked goods. Commercial shortening and deep-frying fats will contain trans fat.  Increase intake of fruits, vegetables, whole grains, and legumes to replace foods high in fat.  Increase consumption of nuts, legumes, and seeds to at least 4 servings weekly. One serving of a legume equals  cup, and 1 serving of nuts or seeds equals  cup.  Choose whole grains more often. Have 3 servings per day (a serving is 1 ounce [oz]).  Eat 4 to 5 servings of vegetables per day. A serving of vegetables is 1 cup of raw leafy  vegetables;  cup of raw or cooked cut-up vegetables;  cup of vegetable juice.  Eat 4 to 5 servings of fruit per day. A serving of fruit is 1 medium whole fruit;  cup of dried fruit;  cup of fresh, frozen, or canned fruit;  cup of 100% fruit juice.  Increase your intake of dietary fiber to 20 to 30 grams per day. Insoluble fiber may help lower your risk of heart disease and may help curb your appetite.  Soluble fiber binds cholesterol to be removed from the blood. Foods high in soluble fiber are dried beans, citrus fruits, oats, apples, bananas, broccoli, Brussels sprouts, and eggplant.  Try to include foods fortified with plant sterols or stanols, such as yogurt, breads, juices, or margarines. Choose several fortified foods to achieve a daily intake of 2 to 3 grams of plant sterols or stanols.  Foods with omega-3 fats can help reduce your risk of heart disease. Aim to have a 3.5 oz portion of fatty fish twice per week, such as salmon, mackerel, albacore tuna, sardines, lake trout, or herring. If you wish to take a fish oil supplement, choose one that contains 1 gram of both DHA and EPA.  Limit processed meats to 2 servings (3 oz portion) weekly.  Limit the sodium in your diet to 1500 milligrams (mg) per day. If you have high blood pressure, talk to a registered dietitian about a DASH (Dietary Approaches to Stop Hypertension) eating plan.  Limit sweets and beverages with added sugar, such as soda, to no more than 5 servings per week. One   serving is:   1 tablespoon sugar.  1 tablespoon jelly or jam.   cup sorbet.  1 cup lemonade.   cup regular soda. CHOOSING FOODS Starches  Allowed: Breads: All kinds (wheat, rye, raisin, white, oatmeal, Italian, French, and English muffin bread). Low-fat rolls: English muffins, frankfurter and hamburger buns, bagels, pita bread, tortillas (not fried). Pancakes, waffles, biscuits, and muffins made with recommended oil.  Avoid: Products made with  saturated or trans fats, oils, or whole milk products. Butter rolls, cheese breads, croissants. Commercial doughnuts, muffins, sweet rolls, biscuits, waffles, pancakes, store-bought mixes. Crackers  Allowed: Low-fat crackers and snacks: Animal, graham, rye, saltine (with recommended oil, no lard), oyster, and matzo crackers. Bread sticks, melba toast, rusks, flatbread, pretzels, and light popcorn.  Avoid: High-fat crackers: cheese crackers, butter crackers, and those made with coconut, palm oil, or trans fat (hydrogenated oils). Buttered popcorn. Cereals  Allowed: Hot or cold whole-grain cereals.  Avoid: Cereals containing coconut, hydrogenated vegetable fat, or animal fat. Potatoes / Pasta / Rice  Allowed: All kinds of potatoes, rice, and pasta (such as macaroni, spaghetti, and noodles).  Avoid: Pasta or rice prepared with cream sauce or high-fat cheese. Chow mein noodles, French fries. Vegetables  Allowed: All vegetables and vegetable juices.  Avoid: Fried vegetables. Vegetables in cream, butter, or high-fat cheese sauces. Limit coconut. Fruit in cream or custard. Protein  Allowed: Limit your intake of meat, seafood, and poultry to no more than 6 oz (cooked weight) per day. All lean, well-trimmed beef, veal, pork, and lamb. All chicken and turkey without skin. All fish and shellfish. Wild game: wild duck, rabbit, pheasant, and venison. Egg whites or low-cholesterol egg substitutes may be used as desired. Meatless dishes: recipes with dried beans, peas, lentils, and tofu (soybean curd). Seeds and nuts: all seeds and most nuts.  Avoid: Prime grade and other heavily marbled and fatty meats, such as short ribs, spare ribs, rib eye roast or steak, frankfurters, sausage, bacon, and high-fat luncheon meats, mutton. Caviar. Commercially fried fish. Domestic duck, goose, venison sausage. Organ meats: liver, gizzard, heart, chitterlings, brains, kidney, sweetbreads. Dairy  Allowed: Low-fat  cheeses: nonfat or low-fat cottage cheese (1% or 2% fat), cheeses made with part skim milk, such as mozzarella, farmers, string, or ricotta. (Cheeses should be labeled no more than 2 to 6 grams fat per oz.). Skim (or 1%) milk: liquid, powdered, or evaporated. Buttermilk made with low-fat milk. Drinks made with skim or low-fat milk or cocoa. Chocolate milk or cocoa made with skim or low-fat (1%) milk. Nonfat or low-fat yogurt.  Avoid: Whole milk cheeses, including colby, cheddar, muenster, Monterey Jack, Havarti, Brie, Camembert, American, Swiss, and blue. Creamed cottage cheese, cream cheese. Whole milk and whole milk products, including buttermilk or yogurt made from whole milk, drinks made from whole milk. Condensed milk, evaporated whole milk, and 2% milk. Soups and Combination Foods  Allowed: Low-fat low-sodium soups: broth, dehydrated soups, homemade broth, soups with the fat removed, homemade cream soups made with skim or low-fat milk. Low-fat spaghetti, lasagna, chili, and Spanish rice if low-fat ingredients and low-fat cooking techniques are used.  Avoid: Cream soups made with whole milk, cream, or high-fat cheese. All other soups. Desserts and Sweets  Allowed: Sherbet, fruit ices, gelatins, meringues, and angel food cake. Homemade desserts with recommended fats, oils, and milk products. Jam, jelly, honey, marmalade, sugars, and syrups. Pure sugar candy, such as gum drops, hard candy, jelly beans, marshmallows, mints, and small amounts of dark chocolate.  Avoid: Commercially prepared   cakes, pies, cookies, frosting, pudding, or mixes for these products. Desserts containing whole milk products, chocolate, coconut, lard, palm oil, or palm kernel oil. Ice cream or ice cream drinks. Candy that contains chocolate, coconut, butter, hydrogenated fat, or unknown ingredients. Buttered syrups. Fats and Oils  Allowed: Vegetable oils: safflower, sunflower, corn, soybean, cottonseed, sesame, canola, olive,  or peanut. Non-hydrogenated margarines. Salad dressing or mayonnaise: homemade or commercial, made with a recommended oil. Low or nonfat salad dressing or mayonnaise.  Limit added fats and oils to 6 to 8 tsp per day (includes fats used in cooking, baking, salads, and spreads on bread). Remember to count the "hidden fats" in foods.  Avoid: Solid fats and shortenings: butter, lard, salt pork, bacon drippings. Gravy containing meat fat, shortening, or suet. Cocoa butter, coconut. Coconut oil, palm oil, palm kernel oil, or hydrogenated oils: these ingredients are often used in bakery products, nondairy creamers, whipped toppings, candy, and commercially fried foods. Read labels carefully. Salad dressings made of unknown oils, sour cream, or cheese, such as blue cheese and Roquefort. Cream, all kinds: half-and-half, light, heavy, or whipping. Sour cream or cream cheese (even if "light" or low-fat). Nondairy cream substitutes: coffee creamers and sour cream substitutes made with palm, palm kernel, hydrogenated oils, or coconut oil. Beverages  Allowed: Coffee (regular or decaffeinated), tea. Diet carbonated beverages, mineral water. Alcohol: Check with your caregiver. Moderation is recommended.  Avoid: Whole milk, regular sodas, and juice drinks with added sugar. Condiments  Allowed: All seasonings and condiments. Cocoa powder. "Cream" sauces made with recommended ingredients.  Avoid: Carob powder made with hydrogenated fats. SAMPLE MENU Breakfast   cup orange juice   cup oatmeal  1 slice toast  1 tsp margarine  1 cup skim milk Lunch  Turkey sandwich with 2 oz turkey, 2 slices bread  Lettuce and tomato slices  Fresh fruit  Carrot sticks  Coffee or tea Snack  Fresh fruit or low-fat crackers Dinner  3 oz lean ground beef  1 baked potato  1 tsp margarine   cup asparagus  Lettuce salad  1 tbs non-creamy dressing   cup peach slices  1 cup skim milk Document Released:  03/31/2008 Document Revised: 12/22/2011 Document Reviewed: 08/22/2013 Exit[employer] Patient Information 2015 [employer]. This information is not intended to replace advice given to you by your health care provider. Make sure you discuss any questions you have with your health care provider.  

## 2014-02-21 NOTE — Progress Notes (Deleted)
   Subjective:    Patient ID: Meredith Velez, female    DOB: 05/31/1986, 28 y.o.   MRN: [id number]  HPI    Review of Systems       Objective:   Physical Exam          Assessment & Plan:

## 2014-02-21 NOTE — Progress Notes (Signed)
Subjective:    Patient ID: Meredith Velez, female    DOB: [birth year], 28 y.o.   MRN: [id number]  HPI 28 year old AAF, nonsmoker, new patient to the practice is in to be established. She has a history of pre-eclampsia with continues elevations in blood pressure post-partum.  She is 11 months post-partum. Admits to suffering from depression that has improved. Last Pap smear 11 months ago. Does not exercise or follow any particular diet.    Patient is also requesting birth control pill. She is not nursing. Last pap smear normal.    Review of Systems  Constitutional: Negative.   HENT: Negative.   Respiratory: Negative.   Cardiovascular: Negative.   Gastrointestinal: Negative.   Endocrine: Negative.   Genitourinary: Negative.   Musculoskeletal: Negative.   Skin: Negative.   Allergic/Immunologic: Negative.   Neurological: Negative.   Psychiatric/Behavioral: Negative.    Past Medical History  Diagnosis Date  . Hypertension   . Depression   . Diabetes mellitus without complication   . Frequent headaches   . Migraines   . UTI (lower urinary tract infection)     History   Social History  . Marital Status: Single    Spouse Name: N/A    Number of Children: N/A  . Years of Education: N/A   Occupational History  . Not on file.   Social History Main Topics  . Smoking status: Never Smoker   . Smokeless tobacco: Never Used  . Alcohol Use: No  . Drug Use: No  . Sexual Activity: Yes    Birth Control/ Protection: None   Other Topics Concern  . Not on file   Social History Narrative  . No narrative on file    Past Surgical History  Procedure Laterality Date  . No past surgeries      Family History  Problem Relation Age of Onset  . Hypertension Mother   . Hyperlipidemia Mother   . Heart disease Mother   . Kidney disease Mother   . Diabetes Mother   . Hypertension Father   . Stroke Father   . Diabetes Father   . Hypertension Brother   . Breast cancer  Maternal Aunt   . Lung cancer Maternal Uncle   . Prostate cancer Maternal Grandmother   . Prostate cancer Maternal Grandfather   . Alzheimer's disease Paternal Grandmother     No Known Allergies  Current Outpatient Prescriptions on File Prior to Visit  Medication Sig Dispense Refill  . acetaminophen (TYLENOL) 500 MG tablet Take 1,000 mg by mouth every 6 (six) hours as needed for headache.      Marland Kitchen. omeprazole (PRILOSEC) 20 MG capsule Take 1 capsule (20 mg total) by mouth daily.  30 capsule  0   No current facility-administered medications on file prior to visit.    BP 130/100  Pulse 101  Temp(Src) 98.5 F (36.9 C) (Oral)  Ht 5\' 2"  (1.575 m)  Wt 155 lb (70.308 kg)  BMI 28.34 kg/m2  SpO2 99%  LMP 08/05/2015chart    Objective:   Physical Exam  Constitutional: She is oriented to person, place, and time. She appears well-developed and well-nourished.  HENT:  Right Ear: External ear normal.  Left Ear: External ear normal.  Mouth/Throat: Oropharynx is clear and moist.  Neck: Normal range of motion. Neck supple. No thyromegaly present.  Cardiovascular: Normal rate, regular rhythm and normal heart sounds.   Pulmonary/Chest: Effort normal and breath sounds normal.  Abdominal: Soft. Bowel sounds are normal.  Musculoskeletal: Normal range of motion.  Neurological: She is alert and oriented to person, place, and time.  Skin: Skin is warm and dry.  Psychiatric: She has a normal mood and affect.          Assessment & Plan:  Alizae was seen today for establish care.  Diagnoses and associated orders for this visit:  Unspecified essential hypertension  Birth control counseling  Other Orders - levonorgestrel-ethinyl estradiol (NORDETTE, 28,) 0.15-30 MG-MCG tablet; Take 1 tablet by mouth daily.   Will try 3 month trial of diet, exercise, and weight reduction prior to initiating blood pressure medication. If continues to be high, will try a beta blocker. Recheck for CPX in 3  months and sooner as needed.

## 2014-03-02 ENCOUNTER — Telehealth: Payer: Self-pay | Admitting: Family

## 2014-03-02 NOTE — Telephone Encounter (Signed)
Patient Information:  Caller Name: Kaylea  Phone: [phone]  Patient: Meredith Velez  Gender: Female  DOB: 1986/04/22  Age: 28 Years  PCP: Adline Mango [facility])  Pregnant: No  Office Follow Up:  Does the office need to follow up with this patient?: No  Instructions For The Office: N/A  RN Note:  Called the office and spoke with the nurse working with Dr. Orvan Falconer and she advised the pt. to take one of her Nifedipine 30mg . tablets tonight and come into the clinic on 03/03/14 at 10:15.  Symptoms  Reason For Call & Symptoms: Went in the Dr. office one week ago and placed on a low sodium diet to help control her BP. BP at 15:45 was 135/121. Pt. complains of headache and does not feel well.  Reviewed Health History In EMR: Yes  Reviewed Medications In EMR: Yes  Reviewed Allergies In EMR: Yes  Reviewed Surgeries / Procedures: Yes  Date of Onset of Symptoms: 03/02/2014 OB / GYN:  LMP: Unknown  Guideline(s) Used:  Headache  Disposition Per Guideline:   See Today or Tomorrow in Office  Reason For Disposition Reached:   Unexplained headache that is present > 24 hours  Advice Given:  Call Back If:  You become worse.  Patient Will Follow Care Advice:  YES  Appointment Scheduled:  03/03/2014 10:15:00 Appointment Scheduled Provider:  [facility])

## 2014-03-03 ENCOUNTER — Ambulatory Visit: Payer: 59 | Admitting: Family Medicine

## 2014-04-02 ENCOUNTER — Telehealth: Payer: Self-pay | Admitting: Family

## 2014-04-02 ENCOUNTER — Other Ambulatory Visit: Payer: Self-pay | Admitting: Family

## 2014-04-02 MED ORDER — METOPROLOL SUCCINATE ER 50 MG PO TB24: 50.0000 mg | ORAL_TABLET | Freq: Every day | ORAL | Status: DC

## 2014-04-02 NOTE — Telephone Encounter (Signed)
Per OV note 02/21/14:Will try 3 month trial of diet, exercise, and weight reduction prior to initiating blood pressure medication. If continues to be high, will try a beta blocker. Recheck for CPX in 3 months and sooner as needed.   Ok to fill?

## 2014-04-02 NOTE — Telephone Encounter (Signed)
Pt is aware of RX sent to [facility] and scheduled f/u on 04/23/14.

## 2014-04-02 NOTE — Telephone Encounter (Signed)
Patient Information:  Caller Name: Kimberlyanne  Phone: [phone]  Patient: Meredith Velez  Gender: Female  DOB: Apr 14, 1986  Age: 28 Years  PCP: Adline Mango [facility])  Pregnant: No  Office Follow Up:  Does the office need to follow up with this patient?: No  Instructions For The Office: N/A  RN Note:  Call came from office while on phone - Rx Toprol XL has been called to pharmacy.  Will go pick up Rx and start it.  Symptoms  Reason For Call & Symptoms: Had been on Nifedipine 30mg  QD started by OB/GYN while pregnant and HBP.  Ran out of medication on 03/19/2014.  Unsure at first but BP readings up for the last week.  Diastolic 100-107 minimum since last Monday.  Today readings 150/124 and 157/119.  Had called office this am to get BP pills but not heard back. Got up from nap at 3:15 pm with slight headadhe on left side of head, left ear discomfort.  Reviewed Health History In EMR: Yes  Reviewed Medications In EMR: Yes  Reviewed Allergies In EMR: Yes  Reviewed Surgeries / Procedures: Yes  Date of Onset of Symptoms: 03/26/2014 OB / GYN:  LMP: 03/24/2014  Guideline(s) Used:  High Blood Pressure  Disposition Per Guideline:   See Today in Office  Reason For Disposition Reached:   Patient wants to be seen  Advice Given:  Lifestyle Changes  Eat a diet high in fresh fruits and low-fat dairy products. Limit your intake of saturated and total fat. Choose foods that are lower in salt.  Call Back If:  Headache, blurred vision, difficulty talking, or difficulty walking occurs  Chest pain or difficulty breathing occurs  You become worse.  Patient Will Follow Care Advice:  YES

## 2014-04-02 NOTE — Telephone Encounter (Signed)
Pt needs new rx nifedipine 30 mg #30 w/refills sent to new pharm [facility]

## 2014-04-03 NOTE — Telephone Encounter (Signed)
noted 

## 2014-04-23 ENCOUNTER — Ambulatory Visit: Payer: 59 | Admitting: Family

## 2014-05-07 ENCOUNTER — Encounter: Payer: Self-pay | Admitting: Family

## 2014-05-11 ENCOUNTER — Other Ambulatory Visit (HOSPITAL_COMMUNITY)
Admission: RE | Admit: 2014-05-11 | Discharge: 2014-05-11 | Disposition: A | Discharge status: Home / Self Care | Payer: 59 | Source: Ambulatory Visit | Attending: Family | Admitting: Family

## 2014-05-11 ENCOUNTER — Encounter: Payer: Self-pay | Admitting: Family

## 2014-05-11 ENCOUNTER — Ambulatory Visit (INDEPENDENT_AMBULATORY_CARE_PROVIDER_SITE_OTHER): Payer: 59 | Admitting: Family

## 2014-05-11 VITALS — BP 120/104 | HR 92 | Ht 62.0 in | Wt 152.0 lb

## 2014-05-11 DIAGNOSIS — N760 Acute vaginitis: Secondary | ICD-10-CM | POA: Insufficient documentation

## 2014-05-11 DIAGNOSIS — Z113 Encounter for screening for infections with a predominantly sexual mode of transmission: Secondary | ICD-10-CM | POA: Insufficient documentation

## 2014-05-11 DIAGNOSIS — Z0000 Encounter for general adult medical examination without abnormal findings: Secondary | ICD-10-CM

## 2014-05-11 DIAGNOSIS — Z01419 Encounter for gynecological examination (general) (routine) without abnormal findings: Secondary | ICD-10-CM | POA: Diagnosis not present

## 2014-05-11 DIAGNOSIS — K219 Gastro-esophageal reflux disease without esophagitis: Secondary | ICD-10-CM

## 2014-05-11 DIAGNOSIS — I10 Essential (primary) hypertension: Secondary | ICD-10-CM

## 2014-05-11 DIAGNOSIS — Z124 Encounter for screening for malignant neoplasm of cervix: Secondary | ICD-10-CM

## 2014-05-11 LAB — COMPREHENSIVE METABOLIC PANEL
ALBUMIN: 3.8 g/dL (ref 3.5–5.2)
ALK PHOS: 57 U/L (ref 39–117)
ALT: 15 U/L (ref 0–35)
AST: 17 U/L (ref 0–37)
BILIRUBIN TOTAL: 0.6 mg/dL (ref 0.2–1.2)
BUN: 14 mg/dL (ref 6–23)
CO2: 25 mEq/L (ref 19–32)
Calcium: 9.1 mg/dL (ref 8.4–10.5)
Chloride: 107 mEq/L (ref 96–112)
Creatinine, Ser: 0.8 mg/dL (ref 0.4–1.2)
GFR: 108.42 mL/min (ref >60.00)
Glucose, Bld: 80 mg/dL (ref 70–99)
Potassium: 3.9 mEq/L (ref 3.5–5.1)
Sodium: 138 mEq/L (ref 135–145)
TOTAL PROTEIN: 8.1 g/dL (ref 6.0–8.3)

## 2014-05-11 LAB — CBC WITH DIFFERENTIAL/PLATELET
Basophils Absolute: 0 10*3/uL (ref 0.0–0.1)
Basophils Relative: 0.4 % (ref 0.0–3.0)
EOS ABS: 0.2 10*3/uL (ref 0.0–0.7)
EOS PCT: 3 % (ref 0.0–5.0)
HEMATOCRIT: 40 % (ref 36.0–46.0)
Hemoglobin: 13.1 g/dL (ref 12.0–15.0)
LYMPHS ABS: 2.3 10*3/uL (ref 0.7–4.0)
Lymphocytes Relative: 29.4 % (ref 12.0–46.0)
MCHC: 32.7 g/dL (ref 30.0–36.0)
MCV: 92.4 fl (ref 78.0–100.0)
MONO ABS: 0.4 10*3/uL (ref 0.1–1.0)
Monocytes Relative: 4.5 % (ref 3.0–12.0)
Neutro Abs: 4.9 10*3/uL (ref 1.4–7.7)
Neutrophils Relative %: 62.7 % (ref 43.0–77.0)
PLATELETS: 336 10*3/uL (ref 150.0–400.0)
RBC: 4.32 Mil/uL (ref 3.87–5.11)
RDW: 13.3 % (ref 11.5–15.5)
WBC: 7.9 10*3/uL (ref 4.0–10.5)

## 2014-05-11 LAB — LIPID PANEL
CHOL/HDL RATIO: 3
CHOLESTEROL: 126 mg/dL (ref 0–200)
HDL: 43.1 mg/dL (ref >39.00)
LDL CALC: 77 mg/dL (ref 0–99)
NonHDL: 82.9
Triglycerides: 29 mg/dL (ref 0.0–149.0)
VLDL: 5.8 mg/dL (ref 0.0–40.0)

## 2014-05-11 LAB — TSH: TSH: 1.79 u[IU]/mL (ref 0.35–4.50)

## 2014-05-11 MED ORDER — METOPROLOL SUCCINATE ER 100 MG PO TB24: 100.0000 mg | ORAL_TABLET | Freq: Every day | ORAL | Status: DC

## 2014-05-11 NOTE — Patient Instructions (Signed)

## 2014-05-11 NOTE — Progress Notes (Signed)
Subjective:    Patient ID: Meredith Velez, female    DOB: 11-May-1986, 28 y.o.   MRN: [id number]  HPI 28 year old African-American female, nonsmoker is in today for complete physical exam. She has a history of hypertension. Takes Toprol-XL 50 mg once daily. Reports often forgetting her dose. Did take medication last night. Is a family history significant for hypertension. Also has concerns of external hemorrhoids noted typically flare around her menstrual cycle. Reports increased defecation and constipation around that time. His been using Preparation H that helps. The symptoms recur.    Review of Systems  Constitutional: Negative.   HENT: Negative.   Eyes: Negative.   Respiratory: Negative.   Cardiovascular: Negative.   Gastrointestinal: Negative.        Hemorrhoids externally.   Endocrine: Negative.   Genitourinary: Negative.   Musculoskeletal: Negative.   Skin: Negative.   Allergic/Immunologic: Negative.   Neurological: Negative.   Hematological: Negative.   Psychiatric/Behavioral: Negative.    Past Medical History  Diagnosis Date  . Hypertension   . Depression   . Diabetes mellitus without complication   . Frequent headaches   . Migraines   . UTI (lower urinary tract infection)     History   Social History  . Marital Status: Single    Spouse Name: N/A    Number of Children: N/A  . Years of Education: N/A   Occupational History  . Not on file.   Social History Main Topics  . Smoking status: Never Smoker   . Smokeless tobacco: Never Used  . Alcohol Use: No  . Drug Use: No  . Sexual Activity: Yes    Birth Control/ Protection: None   Other Topics Concern  . Not on file   Social History Narrative    Past Surgical History  Procedure Laterality Date  . No past surgeries      Family History  Problem Relation Age of Onset  . Hypertension Mother   . Hyperlipidemia Mother   . Heart disease Mother   . Kidney disease Mother   . Diabetes Mother   .  Hypertension Father   . Stroke Father   . Diabetes Father   . Hypertension Brother   . Breast cancer Maternal Aunt   . Lung cancer Maternal Uncle   . Prostate cancer Maternal Grandmother   . Prostate cancer Maternal Grandfather   . Alzheimer's disease Paternal Grandmother     No Known Allergies  Current Outpatient Prescriptions on File Prior to Visit  Medication Sig Dispense Refill  . acetaminophen (TYLENOL) 500 MG tablet Take 1,000 mg by mouth every 6 (six) hours as needed for headache.    . levonorgestrel-ethinyl estradiol (NORDETTE, 28,) 0.15-30 MG-MCG tablet Take 1 tablet by mouth daily. 1 Package 2  . omeprazole (PRILOSEC) 20 MG capsule Take 1 capsule (20 mg total) by mouth daily. 30 capsule 0   No current facility-administered medications on file prior to visit.    BP 120/104 mmHg  Pulse 92  Ht 5\' 2"  (1.575 m)  Wt 152 lb (68.947 kg)  BMI 27.79 kg/m2  LMP 04/18/2014 (Approximate)chart     Objective:   Physical Exam  Constitutional: She is oriented to person, place, and time. She appears well-developed and well-nourished.  HENT:  Right Ear: External ear normal.  Left Ear: External ear normal.  Nose: Nose normal.  Mouth/Throat: Oropharynx is clear and moist.  Eyes: Conjunctivae and EOM are normal. Pupils are equal, round, and reactive to light.  Neck: Normal  range of motion. Neck supple.  Cardiovascular: Normal rate, regular rhythm, normal heart sounds and intact distal pulses.   Pulmonary/Chest: Effort normal and breath sounds normal.  Abdominal: Soft. Bowel sounds are normal.  Genitourinary: Vagina normal and uterus normal. Guaiac negative stool. No vaginal discharge found.  Musculoskeletal: Normal range of motion.  Neurological: She is alert and oriented to person, place, and time. She has normal reflexes.  Skin: Skin is warm and dry.  Psychiatric: She has a normal mood and affect.          Assessment & Plan:  Meredith Velez was seen today for annual  exam.  Diagnoses and associated orders for this visit:  Preventative health care - CMP - CBC with Differential - TSH - POCT urinalysis dipstick - HIV antibody (with reflex) - HSV 2 antibody, IgG - PAP [[place]] - Lipid Panel  Essential hypertension, benign - CMP - CBC with Differential - TSH - POCT urinalysis dipstick - HIV antibody (with reflex) - HSV 2 antibody, IgG - Lipid Panel  Gastroesophageal reflux disease without esophagitis - CMP - CBC with Differential - TSH - POCT urinalysis dipstick - HIV antibody (with reflex) - HSV 2 antibody, IgG - Lipid Panel  Screen for STD (sexually transmitted disease) - HIV antibody (with reflex) - HSV 2 antibody, IgG  Screening for malignant neoplasm of cervix - PAP [[place]]  Other Orders - metoprolol succinate (TOPROL-XL) 100 MG 24 hr tablet; Take 1 tablet (100 mg total) by mouth daily. Take with or immediately following a meal.    Continue current medications. Increase Toprol to 100 mg once daily. Encouraged healthy diet, exercise, monthly self breast exams. Follow-up in 3 weeks for recheck of her blood pressure and sooner as needed.

## 2014-05-11 NOTE — Progress Notes (Signed)
Pre visit review using our clinic review tool, if applicable. No additional management support is needed unless otherwise documented below in the visit note. 

## 2014-05-12 LAB — HIV ANTIBODY (ROUTINE TESTING W REFLEX): HIV 1&2 Ab, 4th Generation: NONREACTIVE

## 2014-05-14 ENCOUNTER — Telehealth: Payer: Self-pay | Admitting: Family

## 2014-05-14 LAB — CYTOLOGY - PAP

## 2014-05-14 LAB — CERVICOVAGINAL ANCILLARY ONLY
BACTERIAL VAGINITIS: NEGATIVE
Candida vaginitis: NEGATIVE

## 2014-05-14 NOTE — Telephone Encounter (Signed)
emmi emailed °

## 2014-05-15 LAB — HSV 2 ANTIBODY, IGG: HSV 2 Glycoprotein G Ab, IgG: <0.1 IV

## 2014-06-12 ENCOUNTER — Ambulatory Visit: Payer: 59 | Admitting: Family

## 2014-06-26 ENCOUNTER — Ambulatory Visit: Payer: 59 | Admitting: Family

## 2014-07-11 ENCOUNTER — Ambulatory Visit (INDEPENDENT_AMBULATORY_CARE_PROVIDER_SITE_OTHER): Payer: 59 | Admitting: Family

## 2014-07-11 ENCOUNTER — Encounter: Payer: Self-pay | Admitting: Family

## 2014-07-11 VITALS — BP 120/108 | HR 101 | Temp 97.7°F | Wt 157.0 lb

## 2014-07-11 DIAGNOSIS — K219 Gastro-esophageal reflux disease without esophagitis: Secondary | ICD-10-CM

## 2014-07-11 DIAGNOSIS — I10 Essential (primary) hypertension: Secondary | ICD-10-CM

## 2014-07-11 NOTE — Progress Notes (Signed)
Pre visit review using our clinic review tool, if applicable. No additional management support is needed unless otherwise documented below in the visit note. 

## 2014-07-11 NOTE — Patient Instructions (Signed)

## 2014-07-12 ENCOUNTER — Encounter: Payer: Self-pay | Admitting: Family

## 2014-07-12 DIAGNOSIS — K219 Gastro-esophageal reflux disease without esophagitis: Secondary | ICD-10-CM | POA: Insufficient documentation

## 2014-07-12 NOTE — Progress Notes (Signed)
Subjective:    Patient ID: Meredith Velez, female    DOB: 1986/03/12, 29 y.o.   MRN: [id number]  HPI  29 year old with a history of hypertension and GERD. She has missed the last 2 doses of her blood pressure medication. She reports checking her blood pressure and it typically ranges 120-30/85-90 at home. She is not exercising. GERD is stable.   Review of Systems  Constitutional: Negative.   Respiratory: Negative.   Cardiovascular: Negative.   Gastrointestinal: Negative.   Endocrine: Negative.   Genitourinary: Negative.   Musculoskeletal: Negative.   Skin: Negative.   Allergic/Immunologic: Negative.   Neurological: Negative.   Hematological: Negative.   Psychiatric/Behavioral: Negative.    Past Medical History  Diagnosis Date  . Hypertension   . Depression   . Diabetes mellitus without complication   . Frequent headaches   . Migraines   . UTI (lower urinary tract infection)     History   Social History  . Marital Status: Single    Spouse Name: N/A    Number of Children: N/A  . Years of Education: N/A   Occupational History  . Not on file.   Social History Main Topics  . Smoking status: Never Smoker   . Smokeless tobacco: Never Used  . Alcohol Use: No  . Drug Use: No  . Sexual Activity: Yes    Birth Control/ Protection: None   Other Topics Concern  . Not on file   Social History Narrative    Past Surgical History  Procedure Laterality Date  . No past surgeries      Family History  Problem Relation Age of Onset  . Hypertension Mother   . Hyperlipidemia Mother   . Heart disease Mother   . Kidney disease Mother   . Diabetes Mother   . Hypertension Father   . Stroke Father   . Diabetes Father   . Hypertension Brother   . Breast cancer Maternal Aunt   . Lung cancer Maternal Uncle   . Prostate cancer Maternal Grandmother   . Prostate cancer Maternal Grandfather   . Alzheimer's disease Paternal Grandmother     No Known Allergies  Current  Outpatient Prescriptions on File Prior to Visit  Medication Sig Dispense Refill  . acetaminophen (TYLENOL) 500 MG tablet Take 1,000 mg by mouth every 6 (six) hours as needed for headache.    . levonorgestrel-ethinyl estradiol (NORDETTE, 28,) 0.15-30 MG-MCG tablet Take 1 tablet by mouth daily. 1 Package 2  . metoprolol succinate (TOPROL-XL) 100 MG 24 hr tablet Take 1 tablet (100 mg total) by mouth daily. Take with or immediately following a meal. 30 tablet 3  . omeprazole (PRILOSEC) 20 MG capsule Take 1 capsule (20 mg total) by mouth daily. 30 capsule 0   No current facility-administered medications on file prior to visit.    BP 120/108 mmHg  Pulse 101  Temp(Src) 97.7 F (36.5 C) (Oral)  Wt 157 lb (71.215 kg)chart    Objective:   Physical Exam  Constitutional: She is oriented to person, place, and time. She appears well-developed and well-nourished.  HENT:  Right Ear: External ear normal.  Left Ear: External ear normal.  Nose: Nose normal.  Mouth/Throat: Oropharynx is clear and moist.  Neck: Normal range of motion. Neck supple. No thyromegaly present.  Cardiovascular: Normal rate, regular rhythm and normal heart sounds.   Pulmonary/Chest: Effort normal and breath sounds normal.  Abdominal: Soft. Bowel sounds are normal.  Musculoskeletal: Normal range of motion.  Neurological: She is alert and oriented to person, place, and time.  Skin: Skin is warm and dry.  Psychiatric: She has a normal mood and affect.          Assessment & Plan:  Meredith Velez was seen today for hypertension.  Diagnoses and associated orders for this visit:  Essential hypertension  Gastroesophageal reflux disease without esophagitis   Call office with any questions or concerns. Send blood pressure readings through mychart so that we can assess on blood pressure medication. Recheck in 4 months.

## 2014-09-21 ENCOUNTER — Telehealth: Payer: Self-pay | Admitting: Family

## 2014-09-21 NOTE — Telephone Encounter (Signed)
Patient would like for you to give her a callback.  She said she really needs to talk to you about a few things.

## 2014-09-21 NOTE — Telephone Encounter (Signed)
Pt c/o struggling with postpartum depression and would like to discuss with Padonda. Pt is very tearful and emotional because she feels like no one understands what she is going through with this and battling hypertension. She has stress at work and feels like she needs some time off. She has seen a therapist in the past but stopped going because she wanted to try working things out on her own.   I advised pt that she should continue her therapy and schedule an OV to discuss leave from work with [employer]. She feels like we are the only people she is able to talk to.  Appointment scheduled

## 2014-09-27 ENCOUNTER — Ambulatory Visit (INDEPENDENT_AMBULATORY_CARE_PROVIDER_SITE_OTHER): Payer: 59 | Admitting: Family

## 2014-09-27 ENCOUNTER — Encounter: Payer: Self-pay | Admitting: Family

## 2014-09-27 VITALS — BP 128/92 | HR 92 | Temp 97.7°F | Ht 62.0 in | Wt 154.8 lb

## 2014-09-27 DIAGNOSIS — F32A Depression, unspecified: Secondary | ICD-10-CM

## 2014-09-27 DIAGNOSIS — F411 Generalized anxiety disorder: Secondary | ICD-10-CM

## 2014-09-27 DIAGNOSIS — F329 Major depressive disorder, single episode, unspecified: Secondary | ICD-10-CM

## 2014-09-27 MED ORDER — ESCITALOPRAM OXALATE 10 MG PO TABS: 10.0000 mg | ORAL_TABLET | Freq: Every day | ORAL | Status: DC

## 2014-09-27 MED ORDER — CLONAZEPAM 0.5 MG PO TABS
0.5000 mg | ORAL_TABLET | Freq: Two times a day (BID) | ORAL | Status: DC | PRN
Start: 2014-09-27 — End: 2016-01-03

## 2014-09-27 NOTE — Patient Instructions (Signed)
Stress and Stress Management Stress is a normal reaction to life events. It is what you feel when life demands more than you are used to or more than you can handle. Some stress can be useful. For example, the stress reaction can help you catch the last bus of the day, study for a test, or meet a deadline at work. But stress that occurs too often or for too long can cause problems. It can affect your emotional health and interfere with relationships and normal daily activities. Too much stress can weaken your immune system and increase your risk for physical illness. If you already have a medical problem, stress can make it worse. CAUSES  All sorts of life events may cause stress. An event that causes stress for one person may not be stressful for another person. Major life events commonly cause stress. These may be positive or negative. Examples include losing your job, moving into a new home, getting married, having a baby, or losing a loved one. Less obvious life events may also cause stress, especially if they occur day after day or in combination. Examples include working long hours, driving in traffic, caring for children, being in debt, or being in a difficult relationship. SIGNS AND SYMPTOMS Stress may cause emotional symptoms including, the following:  Anxiety. This is feeling worried, afraid, on edge, overwhelmed, or out of control.  Anger. This is feeling irritated or impatient.  Depression. This is feeling sad, down, helpless, or guilty.  Difficulty focusing, remembering, or making decisions. Stress may cause physical symptoms, including the following:   Aches and pains. These may affect your head, neck, back, stomach, or other areas of your body.  Tight muscles or clenched jaw.  Low energy or trouble sleeping. Stress may cause unhealthy behaviors, including the following:   Eating to feel better (overeating) or skipping meals.  Sleeping too little, too much, or both.  Working  too much or putting off tasks (procrastination).  Smoking, drinking alcohol, or using drugs to feel better. DIAGNOSIS  Stress is diagnosed through an assessment by your health care provider. Your health care provider will ask questions about your symptoms and any stressful life events.Your health care provider will also ask about your medical history and may order blood tests or other tests. Certain medical conditions and medicine can cause physical symptoms similar to stress. Mental illness can cause emotional symptoms and unhealthy behaviors similar to stress. Your health care provider may refer you to a mental health professional for further evaluation.  TREATMENT  Stress management is the recommended treatment for stress.The goals of stress management are reducing stressful life events and coping with stress in healthy ways.  Techniques for reducing stressful life events include the following:  Stress identification. Self-monitor for stress and identify what causes stress for you. These skills may help you to avoid some stressful events.  Time management. Set your priorities, keep a calendar of events, and learn to say "no." These tools can help you avoid making too many commitments. Techniques for coping with stress include the following:  Rethinking the problem. Try to think realistically about stressful events rather than ignoring them or overreacting. Try to find the positives in a stressful situation rather than focusing on the negatives.  Exercise. Physical exercise can release both physical and emotional tension. The key is to find a form of exercise you enjoy and do it regularly.  Relaxation techniques. These relax the body and mind. Examples include yoga, meditation, tai chi, biofeedback, deep  breathing, progressive muscle relaxation, listening to music, being out in nature, journaling, and other hobbies. Again, the key is to find one or more that you enjoy and can do  regularly.  Healthy lifestyle. Eat a balanced diet, get plenty of sleep, and do not smoke. Avoid using alcohol or drugs to relax.  Strong support network. Spend time with family, friends, or other people you enjoy being around.Express your feelings and talk things over with someone you trust. Counseling or talktherapy with a mental health professional may be helpful if you are having difficulty managing stress on your own. Medicine is typically not recommended for the treatment of stress.Talk to your health care provider if you think you need medicine for symptoms of stress. HOME CARE INSTRUCTIONS  Keep all follow-up visits as directed by your health care provider.  Take all medicines as directed by your health care provider. SEEK MEDICAL CARE IF:  Your symptoms get worse or you start having new symptoms.  You feel overwhelmed by your problems and can no longer manage them on your own. SEEK IMMEDIATE MEDICAL CARE IF:  You feel like hurting yourself or someone else. Document Released: 12/16/2000 Document Revised: 11/06/2013 Document Reviewed: 02/14/2013 ExitCare Patient Information 2015 [employer]. This information is not intended to replace advice given to you by your health care provider. Make sure you discuss any questions you have with your health care provider.  

## 2014-09-27 NOTE — Progress Notes (Signed)
Subjective:    Patient ID: Meredith Velez, female    DOB: 1986/05/06, 29 y.o.   MRN: [id number]  HPI  29 year old African-American female, nonsmoker with a history of postpartum depression is in today with complaints of anxiety and depression. Reports she never really recovered after she had her daughter who is now 36 months old. Doesn't routinely exercise. Has a large support system but still feels alone. 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 Work is stressful. Has feelings of helplessness and hopelessness but denies any thoughts of death or dying.  Review of Systems  Constitutional: Negative.   Respiratory: Negative.   Cardiovascular: Negative.   Musculoskeletal: Negative.   Skin: Negative.   Allergic/Immunologic: Negative.   Neurological: Negative.   Psychiatric/Behavioral: The patient is nervous/anxious.    Past Medical History  Diagnosis Date  . Hypertension   . Depression   . Diabetes mellitus without complication   . Frequent headaches   . Migraines   . UTI (lower urinary tract infection)     History   Social History  . Marital Status: Single    Spouse Name: N/A  . Number of Children: N/A  . Years of Education: N/A   Occupational History  . Not on file.   Social History Main Topics  . Smoking status: Never Smoker   . Smokeless tobacco: Never Used  . Alcohol Use: No  . Drug Use: No  . Sexual Activity: Yes    Birth Control/ Protection: None   Other Topics Concern  . Not on file   Social History Narrative    Past Surgical History  Procedure Laterality Date  . No past surgeries      Family History  Problem Relation Age of Onset  . Hypertension Mother   . Hyperlipidemia Mother   . Heart disease Mother   . Kidney disease Mother   . Diabetes Mother   . Hypertension Father   . Stroke Father   . Diabetes Father   . Hypertension Brother   . Breast cancer Maternal Aunt   . Lung cancer Maternal Uncle   . Prostate  cancer Maternal Grandmother   . Prostate cancer Maternal Grandfather   . Alzheimer's disease Paternal Grandmother     No Known Allergies  Current Outpatient Prescriptions on File Prior to Visit  Medication Sig Dispense Refill  . acetaminophen (TYLENOL) 500 MG tablet Take 1,000 mg by mouth every 6 (six) hours as needed for headache.    . metoprolol succinate (TOPROL-XL) 100 MG 24 hr tablet Take 1 tablet (100 mg total) by mouth daily. Take with or immediately following a meal. 30 tablet 3   No current facility-administered medications on file prior to visit.    BP 128/92 mmHg  Pulse 92  Temp(Src) 97.7 F (36.5 C) (Oral)  Ht  (1.575 m)  Wt 154 lb 12.8 oz (70.217 kg)  BMI 28.31 kg/m2  LMP 02/28/2016chart    Objective:   Physical Exam  Constitutional: She is oriented to person, place, and time. She appears well-developed and well-nourished.  Neck: Normal range of motion. Neck supple.  Cardiovascular: Normal rate, regular rhythm and normal heart sounds.   Pulmonary/Chest: Effort normal and breath sounds normal.  Neurological: She is alert and oriented to person, place, and time.  Skin: Skin is warm and dry.  Psychiatric: She has a normal mood and affect.          Assessment & Plan:  Meredith Velez was seen today  for postpartum care.  Diagnoses and all orders for this visit:  Depression  Generalized anxiety disorder  Other orders -     escitalopram (LEXAPRO) 10 MG tablet; Take 1 tablet (10 mg total) by mouth daily. -     clonazePAM (KLONOPIN) 0.5 MG tablet; Take 1 tablet (0.5 mg total) by mouth 2 (two) times daily as needed for anxiety.   Exercise 45 minutes 3-4 days per week. Start Lexapro 10 mg once daily. Klonopin as needed. Warned of addictive properties. Call the office with any questions or concerns. Recheck in 4 weeks and sooner as needed.

## 2014-10-19 ENCOUNTER — Telehealth: Payer: Self-pay | Admitting: Family

## 2014-10-19 NOTE — Telephone Encounter (Signed)
Patient would like a callback on (c) [phone] or (w) [phone], she states it's personal and really need to talk to you.

## 2014-10-19 NOTE — Telephone Encounter (Signed)
FMLA forms recieved

## 2014-10-19 NOTE — Telephone Encounter (Signed)
Spoke with Padonda. We can provide pt with a note temporarily and FMLA thereafter.   Pt aware and will fax FMLA forms to [place] and I will fax note and forms back

## 2014-10-23 ENCOUNTER — Telehealth: Payer: Self-pay | Admitting: Family

## 2014-10-23 NOTE — Telephone Encounter (Signed)
Spoke with pt and she would like note faxed to Mickeal Skinner at [phone].  Note faxed and work leave 10/23/14-10/30/14

## 2014-10-23 NOTE — Telephone Encounter (Signed)
Called back to give you this information:  (f) [phone]   Meredith Velez

## 2014-11-20 ENCOUNTER — Telehealth: Payer: Self-pay | Admitting: *Deleted

## 2014-11-20 NOTE — Telephone Encounter (Signed)
Called and spoke with patient. Patient states she is fine now. Asked if any chest pain, dizziness, or blurred vision, patient states no and she was okay just concerned of what could happen since took two pills. Patient states medication was increased from 50 mg to 100 mg and out of habit, she took two pills since that is what she usually took. Confirmed a system for herself was in place to remind herself to only take one; offered idea of putting sticky note reminder on bottle and she like that suggestion.   ------------------------------------------------------------------------------------------------------------------------------------------------------------------------------------------------- PLEASE NOTE: All timestamps contained within this report are represented as [place] Standard Time. CONFIDENTIALTY NOTICE: This fax transmission is intended only for the addressee. It contains information that is legally privileged, confidential or otherwise protected from use or disclosure. If you are not the intended recipient, you are strictly prohibited from reviewing, disclosing, copying using or disseminating any of this information or taking any action in reliance on or regarding this information. If you have received this fax in error, please notify us immediately by telephone so that we can arrange for its return to [place]. Phone: [phone], Toll-Free: [phone], Fax: [phone] Page: 1 of 2 Call Id: [id number] [facility] - Client TELEPHONE ADVICE RECORD [facility] Patient Name: Meredith Velez Gender: Female DOB: 10/18/1985 Age: 29 Y 4 M 20 D Return Phone Number: [phone] (Primary) Address: City/State/Zip: [place] Client [facility] - Client Client Site [facility] - Night Physician Adline Mango Contact Type Call Call Type Triage / Clinical Relationship To Patient Self Return Phone Number  Please choose phone number Chief Complaint OVERDOSE took too much medication at once Initial Comment Caller states her BP meds was upped and she took too much meds. PreDisposition Go to ED Nurse Assessment Nurse: Barnett Abu, RN, Cala Bradford Date/Time (Eastern Time): 11/19/2014 10:19:16 PM Confirm and document reason for call. If symptomatic, describe symptoms. ---Caller states her BP medication was increased from  to , but she mistakenly took . Metoprolol XR is the medication. She took it about 10-15 minutes ago. Has the patient traveled out of the country within the last 30 days? ---Not Applicable Does the patient require triage? ---Yes Related visit to physician within the last 2 weeks? ---Yes Does the PT have any chronic conditions? (i.e. diabetes, asthma, etc.) ---Yes List chronic conditions. ---hypertension Did the patient indicate they were pregnant? ---No Guidelines Guideline Title Affirmed Question Affirmed Notes Nurse Date/Time (Eastern Time) Poisoning [1] DOUBLE DOSE (an extra dose or lesser amount) of prescription drug AND [2] any symptoms (e.g., dizziness, nausea, pain, sleepiness) Barnett Abu, RN, Cala Bradford 11/19/2014 10:22:02 PM Disp. Time Lamount Cohen Time) Disposition Final User 11/19/2014 10:17:56 PM Send to Urgent Queue [place] 11/19/2014 10:24:59 PM Call Poison Center Now Yes Barnett Abu, RN, Anders Simmonds Understands: Yes PLEASE NOTE: All timestamps contained within this report are represented as [place] Standard Time. CONFIDENTIALTY NOTICE: This fax transmission is intended only for the addressee. It contains information that is legally privileged, confidential or otherwise protected from use or disclosure. If you are not the intended recipient, you are strictly prohibited from reviewing, disclosing, copying using or disseminating any of this information or taking any action in reliance on or regarding this information. If you have received this fax in  error, please notify us immediately by telephone so that we can arrange for its return to [place]. Phone: [phone], Toll-Free: [phone], Fax: [phone] Page: 2 of 2 Call Id: [id number] Disagree/Comply: Comply Care Advice Given Per  Guideline CALL POISON CENTER NOW: You need to call the [facility] now. The phone number is (___) - ___-____. [facility] [facility] NUMBER: * Phone number: [phone] * You will be connected automatically to your local poison center. CARE ADVICE given per Poisoning (Adult) guideline. After Care Instructions Given Call Event Type User Date / Time Description

## 2014-11-20 NOTE — Telephone Encounter (Signed)
Noted  

## 2016-01-03 ENCOUNTER — Ambulatory Visit: Payer: 59 | Admitting: Family Medicine

## 2016-01-03 ENCOUNTER — Encounter: Payer: Self-pay | Admitting: Family Medicine

## 2016-01-03 ENCOUNTER — Ambulatory Visit (INDEPENDENT_AMBULATORY_CARE_PROVIDER_SITE_OTHER): Payer: 59 | Admitting: Family Medicine

## 2016-01-03 VITALS — BP 144/98 | HR 77 | Temp 98.4°F | Resp 12 | Ht 62.0 in | Wt 166.0 lb

## 2016-01-03 DIAGNOSIS — I10 Essential (primary) hypertension: Secondary | ICD-10-CM

## 2016-01-03 DIAGNOSIS — R110 Nausea: Secondary | ICD-10-CM | POA: Diagnosis not present

## 2016-01-03 DIAGNOSIS — K219 Gastro-esophageal reflux disease without esophagitis: Secondary | ICD-10-CM | POA: Diagnosis not present

## 2016-01-03 LAB — COMPREHENSIVE METABOLIC PANEL
ALK PHOS: 52 U/L (ref 33–115)
ALT: 10 U/L (ref 6–29)
AST: 14 U/L (ref 10–30)
Albumin: 4.5 g/dL (ref 3.6–5.1)
BILIRUBIN TOTAL: 0.5 mg/dL (ref 0.2–1.2)
BUN: 13 mg/dL (ref 7–25)
CALCIUM: 9.3 mg/dL (ref 8.6–10.2)
CO2: 20 mmol/L (ref 20–31)
Chloride: 104 mmol/L (ref 98–110)
Creat: 0.81 mg/dL (ref 0.50–1.10)
GLUCOSE: 93 mg/dL (ref 65–99)
Potassium: 4.3 mmol/L (ref 3.5–5.3)
Sodium: 139 mmol/L (ref 135–146)
TOTAL PROTEIN: 7.6 g/dL (ref 6.1–8.1)

## 2016-01-03 LAB — CBC WITH DIFFERENTIAL/PLATELET
BASOS ABS: 0 {cells}/uL (ref 0–200)
Basophils Relative: 0 %
EOS PCT: 2 %
Eosinophils Absolute: 192 cells/uL (ref 15–500)
HCT: 40.5 % (ref 35.0–45.0)
Hemoglobin: 13.2 g/dL (ref 11.7–15.5)
Lymphocytes Relative: 37 %
Lymphs Abs: 3552 cells/uL (ref 850–3900)
MCH: 30.6 pg (ref 27.0–33.0)
MCHC: 32.6 g/dL (ref 32.0–36.0)
MCV: 93.8 fL (ref 80.0–100.0)
MONOS PCT: 5 %
MPV: 9.5 fL (ref 7.5–12.5)
Monocytes Absolute: 480 cells/uL (ref 200–950)
NEUTROS ABS: 5376 {cells}/uL (ref 1500–7800)
NEUTROS PCT: 56 %
PLATELETS: 340 10*3/uL (ref 140–400)
RBC: 4.32 MIL/uL (ref 3.80–5.10)
RDW: 13 % (ref 11.0–15.0)
WBC: 9.6 10*3/uL (ref 3.8–10.8)

## 2016-01-03 LAB — TSH: TSH: 2.26 m[IU]/L

## 2016-01-03 LAB — POCT URINE PREGNANCY: Preg Test, Ur: NEGATIVE

## 2016-01-03 MED ORDER — ONDANSETRON HCL 4 MG PO TABS: 4.0000 mg | ORAL_TABLET | Freq: Three times a day (TID) | ORAL | Status: DC | PRN

## 2016-01-03 MED ORDER — AMLODIPINE BESYLATE 5 MG PO TABS: 5.0000 mg | ORAL_TABLET | Freq: Every day | ORAL | Status: DC

## 2016-01-03 MED ORDER — OMEPRAZOLE 40 MG PO CPDR: 40.0000 mg | DELAYED_RELEASE_CAPSULE | Freq: Every day | ORAL | Status: DC

## 2016-01-03 NOTE — Progress Notes (Signed)
Pre visit review using our clinic review tool, if applicable. No additional management support is needed unless otherwise documented below in the visit note. 

## 2016-01-03 NOTE — Progress Notes (Signed)
[name] is a 30 y.o.female, who is here today to follow on HTN.  Hx of HTN since 2014. Currently she is on Metoprolol Succinate 100 mg , which she resumed 3 days ago after discontinued it abruptly 3 months ago.  She resumed medication because she started with headache, nausea, and vomiting; so she thought these symptoms were related to elevated BP.  She has not noted  visual changes, exertional chest pain, dyspnea,  focal weakness, or edema.    Lab Results  Component Value Date   CREATININE 0.8 05/11/2014   BUN 14 05/11/2014   NA 138 05/11/2014   K 3.9 05/11/2014   CL 107 05/11/2014   CO2 25 05/11/2014    She has checked BP ?/120.   - + Nausea,vomiting, and little diarrhea 3-4 days ago. Last vomiting 2 days ago, nauseated still and better today.  2 days ago loose stools for a day, resolved. Headache right temporal, resolved yesterday. Still nauseated. No sick contact.  LMP spots intermittently, she is on Nexoplan.   She has Hx of GERD, not following recommended diet and takes OTC PPI's as needed. + Heartburn.    Review of Systems  Constitutional: Positive for fatigue. Negative for fever, activity change, appetite change and unexpected weight change.  HENT: Negative for facial swelling, mouth sores, nosebleeds and trouble swallowing.   Eyes: Negative for pain, redness and visual disturbance.  Respiratory: Negative for cough, shortness of breath and wheezing.   Cardiovascular: Negative for chest pain, palpitations and leg swelling.  Gastrointestinal: Positive for nausea. Negative for abdominal pain and blood in stool.  Endocrine: Negative for cold intolerance and heat intolerance.  Genitourinary: Negative for dysuria, hematuria, decreased urine volume, vaginal discharge, difficulty urinating and pelvic pain.  Musculoskeletal: Negative for myalgias, back pain and arthralgias.  Skin: Negative for color change and rash.  Neurological: Negative for  seizures, syncope, weakness and numbness.  Psychiatric/Behavioral: Negative for confusion. The patient is not nervous/anxious.      Current Outpatient Prescriptions on File Prior to Visit  Medication Sig Dispense Refill  . acetaminophen (TYLENOL) 500 MG tablet Take 1,000 mg by mouth every 6 (six) hours as needed for headache.     No current facility-administered medications on file prior to visit.     Past Medical History  Diagnosis Date  . Hypertension   . Depression   . Diabetes mellitus without complication (HCC)   . Frequent headaches   . Migraines   . UTI (lower urinary tract infection)     No Known Allergies  Social History   Social History  . Marital Status: Single    Spouse Name: N/A  . Number of Children: N/A  . Years of Education: N/A   Social History Main Topics  . Smoking status: Never Smoker   . Smokeless tobacco: Never Used  . Alcohol Use: No  . Drug Use: No  . Sexual Activity: Yes    Birth Control/ Protection: None   Other Topics Concern  . None   Social History Narrative    Filed Vitals:   01/03/16 1541  BP: 144/98  Pulse: 77  Temp: 98.4 F (36.9 C)  Resp: 12   Body mass index is 30.35 kg/(m^2).  SpO2 Readings from Last 3 Encounters:  01/03/16 97%  02/21/14 99%  09/29/13 99%     Physical Exam  Constitutional: She is oriented to person, place, and time. She appears well-developed. No distress.  HENT:  Head: Atraumatic.  Eyes:  Conjunctivae and EOM are normal. Pupils are equal, round, and reactive to light.  Neck: No tracheal deviation present. No thyroid mass present.  Cardiovascular: Normal rate and regular rhythm.   No murmur heard. Pulses:      Dorsalis pedis pulses are 2+ on the right side, and 2+ on the left side.  Respiratory: Effort normal and breath sounds normal. No respiratory distress.  GI: Soft. She exhibits no mass. There is no hepatomegaly. There is tenderness (mild) in the epigastric area. There is no rigidity, no  rebound and no guarding.  Musculoskeletal: She exhibits no edema.  Lymphadenopathy:    She has no cervical adenopathy.  Neurological: She is alert and oriented to person, place, and time. She has normal strength. No cranial nerve deficit or sensory deficit. Coordination and gait normal.  Skin: Skin is warm. No erythema.  Psychiatric: She has a normal mood and affect.  Well groomed, good eye contact.    ASSESSMENT AND PLAN:   [name] was seen today for follow-up.  Diagnoses and all orders for this visit:   Lab Results  Component Value Date   WBC 9.6 01/03/2016   HGB 13.2 01/03/2016   HCT 40.5 01/03/2016   MCV 93.8 01/03/2016   PLT 340 01/03/2016   Lab Results  Component Value Date   TSH 2.26 01/03/2016     Chemistry      Component Value Date/Time   NA 139 01/03/2016 1638   K 4.3 01/03/2016 1638   CL 104 01/03/2016 1638   CO2 20 01/03/2016 1638   BUN 13 01/03/2016 1638   CREATININE 0.81 01/03/2016 1638   CREATININE 0.8 05/11/2014 0937      Component Value Date/Time   CALCIUM 9.3 01/03/2016 1638   ALKPHOS 52 01/03/2016 1638   AST 14 01/03/2016 1638   ALT 10 01/03/2016 1638   BILITOT 0.5 01/03/2016 1638      Gastroesophageal reflux disease without esophagitis  GERD precautions discussed. Some side effects of PPIs reviewed, recommended for 4-8 weeks.  -     omeprazole (PRILOSEC) 40 MG capsule; Take 1 capsule (40 mg total) by mouth daily before breakfast.  Essential hypertension  Not well controlled. Possible complications of elevated BP discussed. Since she just resumed Metoprolol 3 days ago I think she can stop and start Amlodipine. Annual eye examination. F/U in 6 weeks.   -     amLODipine (NORVASC) 5 MG tablet; Take 1 tablet (5 mg total) by mouth daily. -     Comprehensive metabolic panel -     TSH -     CBC  Nausea without vomiting  Vomiting resolved. ? Related to GERD. Bland diet, small portions at the time. Instructed about warning  signs.  -     ondansetron (ZOFRAN) 4 MG tablet; Take 1 tablet (4 mg total) by mouth every 8 (eight) hours as needed for nausea or vomiting. -     POCT urine pregnancy: Negative. -     Comprehensive metabolic panel -     CBC      -She was advised to return sooner than planned today if new concerns arise, sh voices understanding.     Betty G. [place], MD  [facility]. [place] office.

## 2016-01-03 NOTE — Patient Instructions (Addendum)
A few things to remember from today's visit:   1. Gastroesophageal reflux disease without esophagitis   2. Essential hypertension  - amLODipine (NORVASC) 5 MG tablet; Take 1 tablet (5 mg total) by mouth daily.  Dispense: 30 tablet; Refill: 2  3. Nausea without vomiting  - POCT urine pregnancy: Negative.   Since he started metoprolol about 3 days ago, then he can be discontinued, usually this type of medication cannot be discontinued approximately after taking it for a few weeks. We will try amlodipine 5 mg daily. Blood pressure goal for most people is less than 140/90.Some populations (older than 60) the goal is less than 150/90.  Elevated blood pressure increases the risk of strokes, heart and kidney disease, and eye problems. Regular physical activity and a healthy diet (DASH diet) usually help. Low salt diet. Take medications as instructed. Caution with some over the counter medications as cold medications, dietary products (for weight loss), and Ibuprofen or Aleve (frequent use);all these medications could cause elevation of blood pressure.     If you sign-up for My chart, you can communicate easier with [place] in case you have any question or concern.

## 2016-01-09 ENCOUNTER — Encounter: Payer: Self-pay | Admitting: Family Medicine

## 2016-01-09 ENCOUNTER — Ambulatory Visit (INDEPENDENT_AMBULATORY_CARE_PROVIDER_SITE_OTHER): Payer: 59 | Admitting: Family Medicine

## 2016-01-09 VITALS — BP 140/98 | HR 88 | Temp 98.3°F | Resp 12 | Ht 62.0 in | Wt 162.0 lb

## 2016-01-09 DIAGNOSIS — G4452 New daily persistent headache (NDPH): Secondary | ICD-10-CM | POA: Diagnosis not present

## 2016-01-09 DIAGNOSIS — I10 Essential (primary) hypertension: Secondary | ICD-10-CM | POA: Diagnosis not present

## 2016-01-09 DIAGNOSIS — G43011 Migraine without aura, intractable, with status migrainosus: Secondary | ICD-10-CM | POA: Diagnosis not present

## 2016-01-09 MED ORDER — PROPRANOLOL HCL ER 60 MG PO CP24: 60.0000 mg | ORAL_CAPSULE | Freq: Every day | ORAL | Status: DC

## 2016-01-09 MED ORDER — SUMATRIPTAN SUCCINATE 50 MG PO TABS: 50.0000 mg | ORAL_TABLET | Freq: Every day | ORAL | Status: DC | PRN

## 2016-01-09 NOTE — Patient Instructions (Addendum)
A few things to remember from today's visit:   1. Essential hypertension   2. New daily persistent headache  - MR Brain W Wo Contrast; Future  3. Intractable migraine without aura and with status migrainosus  - SUMAtriptan (IMITREX) 50 MG tablet; Take 1 tablet (50 mg total) by mouth daily as needed for migraine or headache. May repeat in 2 hours once if headache persists or recurs.  Dispense: 10 tablet; Refill: 0 - propranolol ER (INDERAL LA) 60 MG 24 hr capsule; Take 1 capsule (60 mg total) by mouth daily.  Dispense: 30 capsule; Refill: 2  Today propranolol was added, start 1 capsule at bedtime and increase to 2 capsules at the same time in 3 days if medication is well tolerated. Brain MRI was placed. If headache becomes severe, vision loss, focal weakness, or other worrisome sign please seek immediate medical attention. Continue monitoring blood pressure. No changes on amlodipine. Please set up an appointment for follow-up in 7-10 days.    If you sign-up for My chart, you can communicate easier with [place] in case you have any question or concern.

## 2016-01-09 NOTE — Progress Notes (Signed)
Pre visit review using our clinic review tool, if applicable. No additional management support is needed unless otherwise documented below in the visit note. 

## 2016-01-09 NOTE — Progress Notes (Signed)
HPI:  ACUTE VISIT:  Chief Complaint  Patient presents with  . Follow-up    Not feeling better after bp med changes. headaches that keeps pt up at night & wakes her up. It seems to be behind her left eye. Pt has been taking bp med since saturday, but did not take it this morning. Pt hasn't had much of an appetite.     Meredith Velez is a 30 y.o. female, who is here today complaining of persistent headache.  I saw Meredith Velez on 01/03/2016 and at that time she was also complaining of headache, nausea, and vomiting. Symptoms were attributed to elevated blood pressure, she was on Metoprolol and had discontinued medication a few months ago.  Amlodipine 5 mg was recommended. BP readings around 130/90's with medication. Today morning BP 144/119.  Today she did not take her antihypertensive medication.  She is still nauseated, had vomiting yesterday x 2-3 times, and having daily headache, throbbing, intermittent ,left retro-ocular for 4 daily, 8-9/10. Sometimes she wakes up with a headache and other times she develops headache during the day, she can feel when the headache is coming.  + Photophobia, no visual changes, no visual aura.  Hx of migraines, "pritty bad", improved after changing jobs years ago. She remembers being on Topamax and Imitrex in the past.  + Palpitations at night for 2 days.  She denies any fever, chills, chest pain, dyspnea, abdominal pain, focal deficit. She has not taking anything for pain today.    Chemistry      Component Value Date/Time   NA 139 01/03/2016 1638   K 4.3 01/03/2016 1638   CL 104 01/03/2016 1638   CO2 20 01/03/2016 1638   BUN 13 01/03/2016 1638   CREATININE 0.81 01/03/2016 1638   CREATININE 0.8 05/11/2014 0937      Component Value Date/Time   CALCIUM 9.3 01/03/2016 1638   ALKPHOS 52 01/03/2016 1638   AST 14 01/03/2016 1638   ALT 10 01/03/2016 1638   BILITOT 0.5 01/03/2016 1638     Lab Results  Component Value Date   TSH  2.26 01/03/2016     Review of Systems  Constitutional: Positive for fatigue. Negative for fever, chills, diaphoresis and unexpected weight change.  HENT: Negative for mouth sores, nosebleeds, sinus pressure, sore throat and trouble swallowing.   Eyes: Positive for photophobia. Negative for pain, redness and visual disturbance.  Respiratory: Negative for cough, shortness of breath and wheezing.   Cardiovascular: Positive for palpitations. Negative for chest pain and leg swelling.  Gastrointestinal: Positive for nausea and vomiting. Negative for abdominal pain.       Negative for changes in bowel habits.  Genitourinary: Negative for dysuria, hematuria, decreased urine volume and difficulty urinating.  Neurological: Positive for headaches. Negative for seizures, syncope, weakness and numbness.  Psychiatric/Behavioral: Negative.       Current Outpatient Prescriptions on File Prior to Visit  Medication Sig Dispense Refill  . acetaminophen (TYLENOL) 500 MG tablet Take 1,000 mg by mouth every 6 (six) hours as needed for headache.    Marland Kitchen amLODipine (NORVASC) 5 MG tablet Take 1 tablet (5 mg total) by mouth daily. 30 tablet 2  . omeprazole (PRILOSEC) 40 MG capsule Take 1 capsule (40 mg total) by mouth daily before breakfast. 90 capsule 0  . ondansetron (ZOFRAN) 4 MG tablet Take 1 tablet (4 mg total) by mouth every 8 (eight) hours as needed for nausea or vomiting. 15 tablet 0   No  current facility-administered medications on file prior to visit.     Past Medical History  Diagnosis Date  . Hypertension   . Depression   . Diabetes mellitus without complication (HCC)   . Frequent headaches   . Migraines   . UTI (lower urinary tract infection)    No Known Allergies  Social History   Social History  . Marital Status: Single    Spouse Name: N/A  . Number of Children: N/A  . Years of Education: N/A   Social History Main Topics  . Smoking status: Never Smoker   . Smokeless tobacco:  Never Used  . Alcohol Use: No  . Drug Use: No  . Sexual Activity: Yes    Birth Control/ Protection: None   Other Topics Concern  . None   Social History Narrative    Filed Vitals:   01/09/16 1109  BP: 140/98  Pulse: 88  Temp: 98.3 F (36.8 C)  Resp: 12   Body mass index is 29.62 kg/(m^2).  SpO2 Readings from Last 3 Encounters:  01/09/16 98%  01/03/16 97%  02/21/14 99%      Physical Exam  Constitutional: She is oriented to person, place, and time. She appears well-developed. She appears distressed (mild, due to pain).  HENT:  Head: Atraumatic.  Mouth/Throat: Oropharynx is clear and moist and mucous membranes are normal.  Eyes: Conjunctivae and EOM are normal. Pupils are equal, round, and reactive to light.  Fundoscopic exam:      The right eye shows no hemorrhage and no papilledema.       The left eye shows no hemorrhage and no papilledema.  Photophobia.  Neck: No tracheal deviation present. No thyroid mass present.  Cardiovascular: Normal rate and regular rhythm.   No murmur heard. Pulses:      Dorsalis pedis pulses are 2+ on the right side, and 2+ on the left side.  Respiratory: Effort normal and breath sounds normal. No respiratory distress.  GI: Soft. She exhibits no mass. There is no hepatomegaly. There is no tenderness. There is no rigidity.  Musculoskeletal: She exhibits no edema or tenderness.  Lymphadenopathy:    She has no cervical adenopathy.  Neurological: She is alert and oriented to person, place, and time. She has normal strength. No cranial nerve deficit or sensory deficit. Coordination and gait normal.  Reflex Scores:      Bicep reflexes are 2+ on the right side and 2+ on the left side.      Patellar reflexes are 2+ on the right side and 2+ on the left side. Skin: Skin is warm. No erythema.  Psychiatric: She has a normal mood and affect. Her speech is normal.      ASSESSMENT AND PLAN:     Meredith Velez was seen today for  follow-up.  Diagnoses and all orders for this visit:  Essential hypertension  BP recheck and reading improved, still elevated. She will continue amlodipine 5 mg daily. Possible complications of elevated BP and migraine discussed.  Continue monitoring BP at home. Propranolol may help with better blood pressure control. Clearly instructed about warning signs. Follow-up in 7-10 days.  New daily persistent headache  I am suspecting this is a migraine headache, because sudden re-occurrence and daily moderate to severe headaches I think brain imaging is appropriate at this time. She had labs during her last office visit, so I don't think lab work is needed today. She was clearly instructed about warning signs.  -     MR Brain  W Wo Contrast; Future  Intractable migraine without aura and with status migrainosus  We discussed some treatment options: Topamax, Propranolol, or Amitriptyline. For now I will recommend propranolol LA , she will start with 60 mg and she was instructed to increase to 120 mg in 3-4 days if medication is overall well tolerated. Imitrex to take upon acute onset, recommended not to take it if she wakes up with headache. She doesn't want Toradol injection, which was offered here today. Note for work, recommended going home and lying down in a quite/dark room.  -     SUMAtriptan (IMITREX) 50 MG tablet; Take 1 tablet (50 mg total) by mouth daily as needed for migraine or headache. May repeat in 2 hours once if headache persists or recurs. -     propranolol ER (INDERAL LA) 60 MG 24 hr capsule; Take 1 capsule (60 mg total) by mouth daily.           -Meredith Velez advised to return or notify a doctor immediately if symptoms worsen or persist or new concerns arise, she voices understanding.       Abdiel Blackerby G. [place], MD  [facility]. [place] office.

## 2016-01-13 ENCOUNTER — Telehealth: Payer: Self-pay | Admitting: Family Medicine

## 2016-01-13 MED ORDER — METOPROLOL SUCCINATE ER 50 MG PO TB24: 50.0000 mg | ORAL_TABLET | Freq: Every day | ORAL | Status: DC

## 2016-01-13 NOTE — Telephone Encounter (Signed)
Pt states the new BP med amLODipine (NORVASC) 5 MG tablet  and it is making her very sick.  Pt has been nauseous, headaches, sweating, body aches, no energy, just cannot function. Pt states she cannot take this med.  Pt did not take yesterday and felt like herself again. No headaches, no nauseous. Pt is requesting to go back on metoprolol succinate (TOPROL-XL) 50 MG 24 hr tablet [[id number]]  [facility]/ [address]

## 2016-01-13 NOTE — Telephone Encounter (Signed)
Pt following up on bp med issue.

## 2016-01-13 NOTE — Telephone Encounter (Signed)
I wonder if she means Propranolol, which is also a BB (same family than Metoprolol). She has had nausea, vomiting, and headache since before Amlodipine was added. I started Propranolol last OV because I ma thinking headache,nausea and vomiting are related to migraines.

## 2016-01-13 NOTE — Telephone Encounter (Signed)
Pt never picked up the propranolol, so I advised pt to have the pharmacy cancel that Rx and not fill it. Will send in Rx for metoprolol. Discontinued the propranolol and amlodipine Rx's.

## 2016-01-27 ENCOUNTER — Telehealth: Payer: Self-pay | Admitting: Family Medicine

## 2016-01-27 NOTE — Telephone Encounter (Signed)
FMLA forms received

## 2016-02-03 NOTE — Telephone Encounter (Signed)
Called patient to find out what days we needed to do her FMLA paperwork for, advised I would fax the paperwork once it is filled out.

## 2016-02-03 NOTE — Telephone Encounter (Signed)
FMLA papers faxed back.

## 2016-02-17 ENCOUNTER — Telehealth: Payer: Self-pay | Admitting: Family Medicine

## 2016-02-17 NOTE — Telephone Encounter (Signed)
Pt states her employer did not received her FMLA paperwork. Was supposed to be there by 8/7.  Pt thought you had sent, however she received 2 faxes from you, so may have sent to the wrong number.  Can you resend to her HR dept, attn Ned Card

## 2016-02-17 NOTE — Telephone Encounter (Signed)
Papers faxed to HR department.

## 2016-02-18 NOTE — Telephone Encounter (Signed)
Pt calling to have the providers 4 pages re-faxed to HR due to them not receiving them.

## 2016-02-18 NOTE — Telephone Encounter (Signed)
Papers refaxed.

## 2016-02-21 ENCOUNTER — Ambulatory Visit: Payer: 59 | Admitting: Family Medicine

## 2016-02-28 ENCOUNTER — Encounter: Payer: Self-pay | Admitting: Family Medicine

## 2016-02-28 ENCOUNTER — Ambulatory Visit (INDEPENDENT_AMBULATORY_CARE_PROVIDER_SITE_OTHER): Payer: 59 | Admitting: Family Medicine

## 2016-02-28 ENCOUNTER — Telehealth: Payer: Self-pay | Admitting: Family Medicine

## 2016-02-28 VITALS — BP 130/90 | HR 83 | Resp 12 | Ht 62.0 in | Wt 162.0 lb

## 2016-02-28 DIAGNOSIS — J309 Allergic rhinitis, unspecified: Secondary | ICD-10-CM | POA: Diagnosis not present

## 2016-02-28 DIAGNOSIS — G43909 Migraine, unspecified, not intractable, without status migrainosus: Secondary | ICD-10-CM | POA: Insufficient documentation

## 2016-02-28 DIAGNOSIS — I10 Essential (primary) hypertension: Secondary | ICD-10-CM | POA: Diagnosis not present

## 2016-02-28 DIAGNOSIS — G43009 Migraine without aura, not intractable, without status migrainosus: Secondary | ICD-10-CM | POA: Diagnosis not present

## 2016-02-28 MED ORDER — FLUTICASONE PROPIONATE 50 MCG/ACT NA SUSP: 2.0000 | Freq: Every day | NASAL | 6 refills | Status: DC

## 2016-02-28 MED ORDER — METOPROLOL SUCCINATE ER 100 MG PO TB24: 100.0000 mg | ORAL_TABLET | Freq: Every day | ORAL | 2 refills | Status: DC

## 2016-02-28 NOTE — Patient Instructions (Signed)
A few things to remember from today's visit:   Essential hypertension  Migraine without aura and without status migrainosus, not intractable  Allergic rhinitis, unspecified allergic rhinitis type - Plan: fluticasone (FLONASE) 50 MCG/ACT nasal spray Blood pressure goal for most people is less than 140/90.Some populations (older than 60) the goal is less than 150/90.  Elevated blood pressure increases the risk of strokes, heart and kidney disease, and eye problems. Regular physical activity and a healthy diet (DASH diet) usually help. Low salt diet. Take medications as instructed. Caution with some over the counter medications as cold medications, dietary products (for weight loss), and Ibuprofen or Aleve (frequent use);all these medications could cause elevation of blood pressure.  For allergies over-the-counter cetirizine 10 mg or Allegra 180 mg daily. Nasal saline rinseas needed.   Please be sure medication list is accurate. If a new problem present, please set up appointment sooner than planned today.

## 2016-02-28 NOTE — Progress Notes (Signed)
Pre visit review using our clinic review tool, if applicable. No additional management support is needed unless otherwise documented below in the visit note. 

## 2016-02-28 NOTE — Progress Notes (Signed)
Meredith Velez is a 30 y.o.female, who is here today to follow on HTN.  Currently Metoprolol Succinate 100 mg daily.   She istaking medications as instructed, no side effects reported.  She has not noted unusual headache, visual changes, exertional chest pain, dyspnea,  focal weakness, or edema.    Lab Results  Component Value Date   CREATININE 0.81 01/03/2016   BUN 13 01/03/2016   NA 139 01/03/2016   K 4.3 01/03/2016   CL 104 01/03/2016   CO2 20 01/03/2016   BP not checked at home.  -Burning, nasal congestion. She had eye exam a few months ago and she was told she had allergic conjunctivitis. She is not taking anything for this problem. + Rhinorrhea.  Headache: Have improved greatly, last episode with left retro-ocular headache about 2 weeks ago lasted 2 days, headaches have decreased in frequency and duration. Positive for photophobia and nausea. Imitrex as needed helps.  -Since her last OV,she is trying to eat healthier, still not exercising regularly.   Review of Systems  Constitutional: Negative for activity change, appetite change, fatigue, fever and unexpected weight change.  HENT: Positive for congestion. Negative for facial swelling, mouth sores, nosebleeds, trouble swallowing and voice change.   Eyes: Positive for itching. Negative for redness and visual disturbance.  Respiratory: Negative for cough, shortness of breath and wheezing.   Cardiovascular: Negative for chest pain, palpitations and leg swelling.  Gastrointestinal: Negative for abdominal pain, nausea and vomiting.       Negative for changes in bowel habits.  Genitourinary: Negative for decreased urine volume, difficulty urinating, dysuria and hematuria.  Neurological: Positive for headaches (improved). Negative for seizures, syncope, weakness and numbness.  Psychiatric/Behavioral: Negative for confusion and sleep disturbance. The patient is not nervous/anxious.      Current Outpatient  Prescriptions on File Prior to Visit  Medication Sig Dispense Refill  . acetaminophen (TYLENOL) 500 MG tablet Take 1,000 mg by mouth every 6 (six) hours as needed for headache.    Marland Kitchen. omeprazole (PRILOSEC) 40 MG capsule Take 1 capsule (40 mg total) by mouth daily before breakfast. 90 capsule 0  . SUMAtriptan (IMITREX) 50 MG tablet Take 1 tablet (50 mg total) by mouth daily as needed for migraine or headache. May repeat in 2 hours once if headache persists or recurs. 10 tablet 0   No current facility-administered medications on file prior to visit.      Past Medical History:  Diagnosis Date  . Depression   . Frequent headaches   . Hypertension   . Migraines   . UTI (lower urinary tract infection)     No Known Allergies  Social History   Social History  . Marital status: Single    Spouse name: N/A  . Number of children: N/A  . Years of education: N/A   Social History Main Topics  . Smoking status: Never Smoker  . Smokeless tobacco: Never Used  . Alcohol use No  . Drug use: No  . Sexual activity: Yes    Birth control/ protection: None   Other Topics Concern  . None   Social History Narrative  . None    Vitals:   02/28/16 1330  BP: 130/90  Pulse: 83  Resp: 12   Body mass index is 29.63 kg/m.    Physical Exam  Nursing note and vitals reviewed. Constitutional: She is oriented to person, place, and time. She appears well-developed. She does not appear ill. No distress.  HENT:  Head: Atraumatic.  Nose: Right sinus exhibits no maxillary sinus tenderness and no frontal sinus tenderness. Left sinus exhibits no maxillary sinus tenderness and no frontal sinus tenderness.  Mouth/Throat: Oropharynx is clear and moist and mucous membranes are normal.  Hypertrophic turbinates.  Eyes: Conjunctivae and EOM are normal. Pupils are equal, round, and reactive to light.  Cardiovascular: Normal rate and regular rhythm.   No murmur heard. Pulses:      Dorsalis pedis pulses are  2+ on the right side, and 2+ on the left side.  Respiratory: Effort normal and breath sounds normal. No respiratory distress.  GI: Soft. She exhibits no mass. There is no hepatomegaly. There is no tenderness.  Musculoskeletal: She exhibits no edema.  Neurological: She is alert and oriented to person, place, and time. She has normal strength. Coordination and gait normal.  Skin: Skin is warm. No erythema.  Psychiatric: She has a normal mood and affect. Her speech is normal.  Well groomed, good eye contact.    ASSESSMENT AND PLAN:   Zane was seen today for follow-up.  Diagnoses and all orders for this visit:  Essential hypertension  Overall adequately controlled. Monitor BP at home, goal < 140/90. No changes in current management. DASH diet recommended. Eye exam recommended annually. F/U in 6 months, before if needed.  -     metoprolol succinate (TOPROL-XL) 100 MG 24 hr tablet; Take 1 tablet (100 mg total) by mouth daily. Take with or immediately following a meal.  Migraine without aura and without status migrainosus, not intractable  Improved. No changes in Imitrex. Instructed about warning signs. F/U in 6 months with headache diary.   Allergic rhinitis, unspecified allergic rhinitis type  OTC antihistaminic daily. Nasal saline at night. Flonase nasal spray, some side effects discussed. F/U as needed.  -     fluticasone (FLONASE) 50 MCG/ACT nasal spray; Place 2 sprays into both nostrils daily.      -Ms. MERECEDES CHANCY was advised to return sooner than planned today if new concerns arise.     Betty G. [place], MD  [facility]. [place] office.

## 2016-03-01 ENCOUNTER — Encounter: Payer: Self-pay | Admitting: Family Medicine

## 2016-03-03 NOTE — Telephone Encounter (Signed)
error 

## 2016-05-28 ENCOUNTER — Other Ambulatory Visit: Payer: Self-pay | Admitting: Family

## 2016-06-03 NOTE — Telephone Encounter (Signed)
Pt has established with Dr. Jordan   

## 2016-06-25 ENCOUNTER — Ambulatory Visit (INDEPENDENT_AMBULATORY_CARE_PROVIDER_SITE_OTHER): Payer: 59 | Admitting: Family Medicine

## 2016-06-25 ENCOUNTER — Encounter: Payer: Self-pay | Admitting: Family Medicine

## 2016-06-25 VITALS — BP 132/80 | HR 79 | Resp 12 | Ht 62.0 in | Wt 169.4 lb

## 2016-06-25 DIAGNOSIS — N898 Other specified noninflammatory disorders of vagina: Secondary | ICD-10-CM | POA: Diagnosis not present

## 2016-06-25 MED ORDER — METRONIDAZOLE 0.75 % VA GEL: 1.0000 | Freq: Every day | VAGINAL | 0 refills | Status: AC

## 2016-06-25 NOTE — Progress Notes (Signed)
HPI:  ACUTE VISIT:  Chief Complaint  Patient presents with  . possible yeast infection    Meredith Velez is a 30 y.o. female, who is here today complaining of vaginal odor and "discomfort" for the past few weeks. She has been treated for similar symptoms 5-6 years ago and recalls having similar symptoms, Dx with yeast and/or BV infections.  On the past several weeks she has had some itching and "slight" odorous whitish/offwhite vaginal discharge. Symptoms after wearing tampon once and improved with OTC Monistat but started back a few days ago.  Symptoms are exacerbated by menses and sex intercourse. She denies dyspareunia, abnormal vaginal bleeding, skin lesions/rash,abdominal pain,nausea,vomiting, or arthralgias.  LMP 06/02/16 n OCP. Hx of STD: Denies  Reporting Hx of yeast reported on pap smears results.  Symptoms otherwise stable.   Review of Systems  Constitutional: Negative for chills and fever.  HENT: Negative for mouth sores and sore throat.   Eyes: Negative for redness.  Gastrointestinal: Negative for abdominal pain, nausea and vomiting.  Genitourinary: Positive for vaginal discharge. Negative for dyspareunia, dysuria, genital sores, menstrual problem, pelvic pain and vaginal bleeding.  Musculoskeletal: Negative for arthralgias.  Skin: Negative for rash.      Current Outpatient Prescriptions on File Prior to Visit  Medication Sig Dispense Refill  . acetaminophen (TYLENOL) 500 MG tablet Take 1,000 mg by mouth every 6 (six) hours as needed for headache.    . fluticasone (FLONASE) 50 MCG/ACT nasal spray Place 2 sprays into both nostrils daily. 16 g 6  . metoprolol succinate (TOPROL-XL) 100 MG 24 hr tablet Take 1 tablet (100 mg total) by mouth daily. Take with or immediately following a meal. 90 tablet 2  . metoprolol succinate (TOPROL-XL) 100 MG 24 hr tablet TAKE 1 TABLET BY MOUTH EVERY DAY WITH OR IMMEDIATELY FOLLOWING A MEAL. 30 tablet 3  . omeprazole  (PRILOSEC) 40 MG capsule Take 1 capsule (40 mg total) by mouth daily before breakfast. 90 capsule 0  . SUMAtriptan (IMITREX) 50 MG tablet Take 1 tablet (50 mg total) by mouth daily as needed for migraine or headache. May repeat in 2 hours once if headache persists or recurs. 10 tablet 0   No current facility-administered medications on file prior to visit.      Past Medical History:  Diagnosis Date  . Depression   . Frequent headaches   . Hypertension   . Migraines   . UTI (lower urinary tract infection)    No Known Allergies  Social History   Social History  . Marital status: Single    Spouse name: N/A  . Number of children: N/A  . Years of education: N/A   Social History Main Topics  . Smoking status: Never Smoker  . Smokeless tobacco: Never Used  . Alcohol use No  . Drug use: No  . Sexual activity: Yes    Birth control/ protection: None   Other Topics Concern  . None   Social History Narrative  . None    Vitals:   06/25/16 1620  BP: 132/80  Pulse: 79  Resp: 12   Body mass index is 30.98 kg/m.    Physical Exam  Nursing note and vitals reviewed. Constitutional: She is oriented to person, place, and time. She appears well-developed. She does not appear ill. No distress.  HENT:  Head: Atraumatic.  Eyes: Conjunctivae are normal.  GI: Soft. She exhibits no mass. There is no hepatomegaly. There is no tenderness. There is no  CVA tenderness.  Musculoskeletal: She exhibits no edema.  Lymphadenopathy:       Right: No inguinal adenopathy present.       Left: No inguinal adenopathy present.  Neurological: She is alert and oriented to person, place, and time. Gait normal.  Skin: Skin is warm. No rash noted. No erythema.  Psychiatric: She has a normal mood and affect.  Well groomed and good eye contact.      ASSESSMENT AND PLAN:     Meredith Velez was seen today for possible yeast infection.  Diagnoses and all orders for this visit:  Vaginal discharge -      metroNIDAZOLE (METROGEL) 0.75 % vaginal gel; Place 1 Applicatorful vaginally at bedtime.    We discussed possible etiologies, including fungal infection, STD,BV,and physiologic discharge. We agree on empiric treatment for BV given Hx, she ca use OTC Monistat again after completing Abx treatment. She prefers vaginal Metronidazole instead oral. Follow up in 3-4 weeks if symptoms persist. Some side effects of Metronidazole discussed.     Return if symptoms worsen or fail to improve.     -Ms.Meredith Velez was advised to return or notify a doctor immediately if symptoms worsen or persist or new concerns arise.       Betty G. [place], MD  [facility]. [place] office.

## 2016-06-25 NOTE — Patient Instructions (Signed)
A few things to remember from today's visit:   Vaginal discharge - Plan: metroNIDAZOLE (METROGEL) 0.75 % vaginal gel  If not better pelvic examination is going to be necessary.   Please be sure medication list is accurate. If a new problem present, please set up appointment sooner than planned today.

## 2016-06-25 NOTE — Progress Notes (Signed)
Pre visit review using our clinic review tool, if applicable. No additional management support is needed unless otherwise documented below in the visit note. 

## 2016-08-24 DIAGNOSIS — Z01419 Encounter for gynecological examination (general) (routine) without abnormal findings: Secondary | ICD-10-CM | POA: Diagnosis not present

## 2016-08-24 DIAGNOSIS — Z124 Encounter for screening for malignant neoplasm of cervix: Secondary | ICD-10-CM | POA: Diagnosis not present

## 2016-09-01 ENCOUNTER — Ambulatory Visit: Payer: 59 | Admitting: Family Medicine

## 2017-01-21 ENCOUNTER — Ambulatory Visit (INDEPENDENT_AMBULATORY_CARE_PROVIDER_SITE_OTHER): Payer: 59 | Admitting: Family Medicine

## 2017-01-21 ENCOUNTER — Encounter: Payer: Self-pay | Admitting: Family Medicine

## 2017-01-21 VITALS — BP 140/90 | HR 78 | Temp 98.2°F | Ht 62.0 in | Wt 169.2 lb

## 2017-01-21 DIAGNOSIS — R51 Headache: Secondary | ICD-10-CM | POA: Diagnosis not present

## 2017-01-21 DIAGNOSIS — J3089 Other allergic rhinitis: Secondary | ICD-10-CM | POA: Diagnosis not present

## 2017-01-21 DIAGNOSIS — R519 Headache, unspecified: Secondary | ICD-10-CM

## 2017-01-21 DIAGNOSIS — J0100 Acute maxillary sinusitis, unspecified: Secondary | ICD-10-CM

## 2017-01-21 DIAGNOSIS — I10 Essential (primary) hypertension: Secondary | ICD-10-CM

## 2017-01-21 NOTE — Patient Instructions (Signed)
BEFORE YOU LEAVE: -follow up: with Kathie Rhodes in 3-5 days  Take the antibiotic as instructed.  Start zyrtec at night and flonase 2 sprays each nostril daily. Do not take any other over the counter cold, sinus or allergy medications.  Use tylenol, Excedrin or tiger balm (topical) menthol per instructions if needed for pain. Do not use aleve, ibuprofen, naproxen or other anti-inflammatory medications.  I hope you are feeling better soon! Seek care immediately if worsening, new concerns or you are not improving with treatment.

## 2017-01-21 NOTE — Progress Notes (Signed)
HPI:  Acute visit for headache: -x2 days, but has hx migraines and headache on R last week -L maxillary and around eye, around head -has had bad sinus issues for about 1-2 weeks, congestion, PND, fluid in ears, ear pain, sinus headache, max sinus pain with leaning forward -no fevers, tick bite, stiff neck, vomiting, vision changes, weakness, numbness, speech issues, SOB -taking Claritin D, severe cold and sinus OTC meds and aleve the last week -NOT worst headache of life  ROS: See pertinent positives and negatives per HPI.  Past Medical History:  Diagnosis Date  . Depression   . Frequent headaches   . Hypertension   . Migraines   . UTI (lower urinary tract infection)     Past Surgical History:  Procedure Laterality Date  . NO PAST SURGERIES      Family History  Problem Relation Age of Onset  . Hypertension Mother   . Hyperlipidemia Mother   . Heart disease Mother   . Kidney disease Mother   . Diabetes Mother   . Hypertension Father   . Stroke Father   . Diabetes Father   . Hypertension Brother   . Breast cancer Maternal Aunt   . Lung cancer Maternal Uncle   . Prostate cancer Maternal Grandmother   . Prostate cancer Maternal Grandfather   . Alzheimer's disease Paternal Grandmother     Social History   Social History  . Marital status: Single    Spouse name: N/A  . Number of children: N/A  . Years of education: N/A   Social History Main Topics  . Smoking status: Never Smoker  . Smokeless tobacco: Never Used  . Alcohol use No  . Drug use: No  . Sexual activity: Yes    Birth control/ protection: None   Other Topics Concern  . None   Social History Narrative  . None     Current Outpatient Prescriptions:  .  acetaminophen (TYLENOL) 500 MG tablet, Take 1,000 mg by mouth every 6 (six) hours as needed for headache., Disp: , Rfl:  .  metoprolol succinate (TOPROL-XL) 100 MG 24 hr tablet, Take 1 tablet (100 mg total) by mouth daily. Take with or immediately  following a meal., Disp: 90 tablet, Rfl: 2 .  omeprazole (PRILOSEC) 40 MG capsule, Take 1 capsule (40 mg total) by mouth daily before breakfast., Disp: 90 capsule, Rfl: 0 .  SUMAtriptan (IMITREX) 50 MG tablet, Take 1 tablet (50 mg total) by mouth daily as needed for migraine or headache. May repeat in 2 hours once if headache persists or recurs., Disp: 10 tablet, Rfl: 0  EXAM:  Vitals:   01/21/17 1525 01/21/17 1533  BP: (!) 140/100 140/90  Pulse: 78   Temp: 98.2 F (36.8 C)     Body mass index is 30.95 kg/m.  GENERAL: vitals reviewed and listed above, alert, oriented, appears well hydrated and in no acute distress  HEENT: atraumatic, conjunttiva clear, no obvious abnormalities on inspection of external nose and ears, normal appearance of ear canals and TMs except for clear effusion L, thick nasal congestion, mild post oropharyngeal erythema with PND, no tonsillar edema or exudate, L max sinus TTP  NECK: no obvious masses on inspection, normal ROM, no meningeal signs  LUNGS: clear to auscultation bilaterally, no wheezes, rales or rhonchi, good air movement  CV: HRRR, no peripheral edema  MS: moves all extremities without noticeable abnormality  PSYCH/NEURO: pleasant and cooperative, no obvious depression or anxiety, CN II-XII grossly intact, finger to nose  normal, speech and thought processing grossly intact  ASSESSMENT AND PLAN:  Discussed the following assessment and plan:  Acute non-recurrent maxillary sinusitis  Non-seasonal allergic rhinitis, unspecified trigger  Acute nonintractable headache, unspecified headache type  Essential hypertension  -we discussed possible serious and likely etiologies, workup and treatment, treatment risks and return precautions - suspect sinusitis, headache related to that, elevated BP 2ndary to meds -after this discussion, Dany opted for tx per orders and pt instructions -follow up advised closely - early next week -of course, we  advised Eymi  to return or notify a doctor immediately if symptoms worsen or persist or new concerns arise - ER over the weekend if worsening, new concerns or not improving.   Patient Instructions  BEFORE YOU LEAVE: -follow up: with Kathie Rhodes in 3-5 days  Take the antibiotic as instructed.  Start zyrtec at night and flonase 2 sprays each nostril daily. Do not take any other over the counter cold, sinus or allergy medications.  Use tylenol, Excedrin or tiger balm (topical) menthol per instructions if needed for pain. Do not use aleve, ibuprofen, naproxen or other anti-inflammatory medications.  I hope you are feeling better soon! Seek care immediately if worsening, new concerns or you are not improving with treatment.      Kriste Basque R., DO

## 2017-01-22 ENCOUNTER — Telehealth: Payer: Self-pay | Admitting: Family Medicine

## 2017-01-22 MED ORDER — AMOXICILLIN-POT CLAVULANATE 875-125 MG PO TABS: 1.0000 | ORAL_TABLET | Freq: Two times a day (BID) | ORAL | 0 refills | Status: DC

## 2017-01-22 NOTE — Telephone Encounter (Signed)
Looks like was not signed/sent and I apologize. Just sent. Please let pt know and let her know to call us if any trouble with getting this today!

## 2017-01-22 NOTE — Telephone Encounter (Signed)
I called the pt and informed her of the message below

## 2017-01-22 NOTE — Telephone Encounter (Signed)
Pt is calling to see if the antibiotic was called into the pharmacy she state that she went and they did not have it  Pharm:  [facility] on [address]

## 2017-04-28 ENCOUNTER — Other Ambulatory Visit: Payer: Self-pay | Admitting: Family Medicine

## 2017-04-28 DIAGNOSIS — I10 Essential (primary) hypertension: Secondary | ICD-10-CM

## 2017-09-22 ENCOUNTER — Encounter: Payer: Self-pay | Admitting: Family Medicine

## 2017-09-22 ENCOUNTER — Ambulatory Visit: Payer: 59 | Admitting: Family Medicine

## 2017-09-22 ENCOUNTER — Encounter: Payer: Self-pay | Admitting: *Deleted

## 2017-09-22 VITALS — BP 133/82 | HR 76 | Temp 98.2°F | Resp 12 | Ht 62.0 in | Wt 171.1 lb

## 2017-09-22 DIAGNOSIS — Z20828 Contact with and (suspected) exposure to other viral communicable diseases: Secondary | ICD-10-CM

## 2017-09-22 DIAGNOSIS — J060 Acute laryngopharyngitis: Secondary | ICD-10-CM | POA: Diagnosis not present

## 2017-09-22 DIAGNOSIS — J069 Acute upper respiratory infection, unspecified: Secondary | ICD-10-CM | POA: Diagnosis not present

## 2017-09-22 DIAGNOSIS — R110 Nausea: Secondary | ICD-10-CM

## 2017-09-22 LAB — POC INFLUENZA A&B (BINAX/QUICKVUE)
INFLUENZA A, POC: NEGATIVE
Influenza B, POC: NEGATIVE

## 2017-09-22 LAB — POCT RAPID STREP A (OFFICE): RAPID STREP A SCREEN: NEGATIVE

## 2017-09-22 MED ORDER — MAGIC MOUTHWASH W/LIDOCAINE: 5.0000 mL | Freq: Three times a day (TID) | ORAL | 0 refills | Status: AC | PRN

## 2017-09-22 MED ORDER — ONDANSETRON HCL 4 MG PO TABS: 4.0000 mg | ORAL_TABLET | Freq: Three times a day (TID) | ORAL | 0 refills | Status: AC | PRN

## 2017-09-22 MED ORDER — OSELTAMIVIR PHOSPHATE 75 MG PO CAPS: 75.0000 mg | ORAL_CAPSULE | Freq: Two times a day (BID) | ORAL | 0 refills | Status: AC

## 2017-09-22 NOTE — Patient Instructions (Signed)
A few things to remember from today's visit:   Sore throat and laryngitis - Plan: POC Rapid Strep A, magic mouthwash w/lidocaine SOLN  Exposure to influenza - Plan: POC Influenza A&B (Binax test), oseltamivir (TAMIFLU) 75 MG capsule  Nausea without vomiting - Plan: ondansetron (ZOFRAN) 4 MG tablet   Symptomatic treatment: Over the counter Acetaminophen 500 mg and/or Ibuprofen (400-600 mg) if there is not contraindications; you can alternate in between both every 4-6 hours. Gargles with saline water and throat lozenges might also help. Cold fluids. Voice rest.     Seek prompt medical evaluation if you are having difficulty breathing, mouth swelling, throat closing up, not able to swallow liquids (drooling), skin rash/bruising, or worsening symptoms.  Please follow up in 2 weeks if not any better.    Please be sure medication list is accurate. If a new problem present, please set up appointment sooner than planned today.

## 2017-09-22 NOTE — Progress Notes (Signed)
ACUTE VISIT  HPI:  Chief Complaint  Patient presents with  . Adenopathy    left side of neck that is causing pain, started yesterday  . Headache    started this morning, took 1 Advil  . Otalgia    left ear    Ms.Ulyana Pitones is a 32 y.o.female here today complaining of respiratory symptoms that started yesterday. According to patient, she has been taking care of her mother, who was recently diagnosed with influenza.  He is she failed a tender "swollen gland" on left side of her neck, today pain is radiated to left retro-auricular area, earache. She denies hearing loss or ear drainage. Today she started with dysphonia.  Sore throat, exacerbated by talking and swallowing. She denies stridor or dysphasia.  Left temporoparietal headache, pressure-like.  She denies associated visual changes or focal deficit. Mild nausea but no vomiting.   URI   This is a new problem. The current episode started yesterday. The problem has been gradually worsening. There has been no fever. Associated symptoms include congestion, ear pain, headaches, nausea, a plugged ear sensation, rhinorrhea, a sore throat and swollen glands. Pertinent negatives include no abdominal pain, coughing, diarrhea, joint swelling, rash, vomiting or wheezing. She has tried NSAIDs for the symptoms. The treatment provided mild relief.    No Hx of recent travel. No known insect bite.  Hx of allergies: No  OTC medications for this problem: Advil.   Review of Systems  Constitutional: Positive for activity change, appetite change, chills and fatigue. Negative for fever.  HENT: Positive for congestion, ear pain, postnasal drip, rhinorrhea, sore throat and voice change. Negative for ear discharge, hearing loss, mouth sores, sinus pressure and trouble swallowing.   Eyes: Negative for discharge, redness and itching.  Respiratory: Negative for cough, chest tightness, shortness of breath and wheezing.     Cardiovascular: Negative for leg swelling.  Gastrointestinal: Positive for nausea. Negative for abdominal pain, diarrhea and vomiting.  Musculoskeletal: Negative for gait problem and myalgias.  Skin: Negative for rash.  Allergic/Immunologic: Negative for environmental allergies.  Neurological: Positive for headaches. Negative for syncope, facial asymmetry, weakness and numbness.  Hematological: Positive for adenopathy. Does not bruise/bleed easily.  Psychiatric/Behavioral: Negative for confusion. The patient is nervous/anxious.       Current Outpatient Medications on File Prior to Visit  Medication Sig Dispense Refill  . acetaminophen (TYLENOL) 500 MG tablet Take 1,000 mg by mouth every 6 (six) hours as needed for headache.    . metoprolol succinate (TOPROL-XL) 100 MG 24 hr tablet TAKE 1 TABLET BY MOUTH DAILY, WITH OR IMMEDIATELY FOLLOWING A MEAL 90 tablet 1  . omeprazole (PRILOSEC) 40 MG capsule Take 1 capsule (40 mg total) by mouth daily before breakfast. 90 capsule 0  . PARAGARD INTRAUTERINE COPPER IUD IUD 1 each by Intrauterine route once.    . SUMAtriptan (IMITREX) 50 MG tablet Take 1 tablet (50 mg total) by mouth daily as needed for migraine or headache. May repeat in 2 hours once if headache persists or recurs. 10 tablet 0  . valACYclovir (VALTREX) 500 MG tablet valacyclovir 500 mg tablet  Take 1 tablet every day by oral route.     No current facility-administered medications on file prior to visit.      Past Medical History:  Diagnosis Date  . Depression   . Frequent headaches   . Hypertension   . Migraines   . UTI (lower urinary tract infection)    No  Known Allergies  Social History   Socioeconomic History  . Marital status: Single    Spouse name: None  . Number of children: None  . Years of education: None  . Highest education level: None  Social Needs  . Financial resource strain: None  . Food insecurity - worry: None  . Food insecurity - inability: None   . Transportation needs - medical: None  . Transportation needs - non-medical: None  Occupational History  . None  Tobacco Use  . Smoking status: Never Smoker  . Smokeless tobacco: Never Used  Substance and Sexual Activity  . Alcohol use: No  . Drug use: No  . Sexual activity: Yes    Birth control/protection: None  Other Topics Concern  . None  Social History Narrative  . None    Vitals:   09/22/17 1134  BP: 133/82  Pulse: 76  Resp: 12  Temp: 98.2 F (36.8 C)  SpO2: 100%   Body mass index is 31.3 kg/m.   Physical Exam  Nursing note and vitals reviewed. Constitutional: She is oriented to person, place, and time. She appears well-developed. She does not appear ill. No distress.  HENT:  Head: Normocephalic and atraumatic.  Right Ear: Tympanic membrane, external ear and ear canal normal.  Left Ear: Tympanic membrane, external ear and ear canal normal.  Nose: Right sinus exhibits no maxillary sinus tenderness and no frontal sinus tenderness. Left sinus exhibits no maxillary sinus tenderness and no frontal sinus tenderness.  Mouth/Throat: Uvula is midline and mucous membranes are normal. Posterior oropharyngeal erythema (Mild) present. No oropharyngeal exudate or posterior oropharyngeal edema.  Moderate dysphonia. Ear examination otherwise negative, no pain elicited upon pressing trackers or pulling ear.   Eyes: Conjunctivae are normal. Pupils are equal, round, and reactive to light.  Neck: No muscular tenderness present. No tracheal deviation, no edema and no erythema present.  Cardiovascular: Normal rate and regular rhythm.  No murmur heard. Respiratory: Effort normal and breath sounds normal. No stridor. No respiratory distress.  Lymphadenopathy:       Head (right side): No submandibular adenopathy present.       Head (left side): No submandibular adenopathy present.    She has cervical adenopathy.       Left cervical: Posterior cervical adenopathy present.  No  abnormalities on cervical inspection. I do not appreciate significant adenopathy, but tenderness is present upon palpation of posterior cervical and posterior auricular area.  Neurological: She is alert and oriented to person, place, and time. She has normal strength.  Skin: Skin is warm. No rash noted. No erythema.  Psychiatric: Her mood appears anxious.  Well groomed, good eye contact.      ASSESSMENT AND PLAN:   Ms. Molinda was seen today for adenopathy, headache and otalgia.  Diagnoses and all orders for this visit:  Nausea without vomiting  Adequate ordered hydration, small frequent sips of clear fluids.  Symptomatic treatment with Zofran 4 mg 3 times daily as needed.  -     ondansetron (ZOFRAN) 4 MG tablet; Take 1 tablet (4 mg total) by mouth every 8 (eight) hours as needed for up to 4 days for nausea or vomiting.  Sore throat and laryngitis  Most likely viral etiology. Rapid strep here in the office negative. We will follow strep culture. Voice rest. Follow shingles on Magic mouthwash with lidocaine recommended for symptomatic treatment of sore throat.  -     POC Rapid Strep A -     magic mouthwash w/lidocaine  SOLN; Take 5 mLs by mouth 3 (three) times daily as needed for up to 10 days for mouth pain. 50 ml of diphenhydramine, alum and mag hydroxide, and lidocaine to make 150 ml -     Culture, Group A Strep  Exposure to influenza  Rapid flu test here in the office was negative. Because she was exposed to influenza, treatment with Tamiflu were recommended.   -     POC Influenza A&B (Binax test) -     oseltamivir (TAMIFLU) 75 MG capsule; Take 1 capsule (75 mg total) by mouth 2 (two) times daily for 5 days.  URI, acute  Symptoms suggests a viral etiology, symptomatic treatment recommended. Earache could be related to sore throat, examination did not suggest ear infectious process.  Instructed to monitor for signs of complications. Instructed to monitor for new  symptoms. F/U as needed.     -Ms. Grayson White was advised to seek attention immediately if symptoms worsen or to follow if they persist or new concerns arise.       Ritha Sampedro G. [place], MD  [facility]. [place] office.

## 2017-09-24 LAB — CULTURE, GROUP A STREP
MICRO NUMBER:: 90351396
SPECIMEN QUALITY:: ADEQUATE

## 2017-12-03 ENCOUNTER — Ambulatory Visit: Payer: 59 | Admitting: Family Medicine

## 2017-12-03 ENCOUNTER — Ambulatory Visit: Payer: Self-pay

## 2017-12-03 ENCOUNTER — Encounter: Payer: Self-pay | Admitting: *Deleted

## 2017-12-03 ENCOUNTER — Encounter: Payer: Self-pay | Admitting: Family Medicine

## 2017-12-03 VITALS — BP 140/100 | HR 76 | Temp 97.9°F | Resp 12 | Ht 62.0 in | Wt 166.2 lb

## 2017-12-03 DIAGNOSIS — I10 Essential (primary) hypertension: Secondary | ICD-10-CM

## 2017-12-03 DIAGNOSIS — R110 Nausea: Secondary | ICD-10-CM

## 2017-12-03 DIAGNOSIS — G43009 Migraine without aura, not intractable, without status migrainosus: Secondary | ICD-10-CM | POA: Diagnosis not present

## 2017-12-03 DIAGNOSIS — M542 Cervicalgia: Secondary | ICD-10-CM

## 2017-12-03 MED ORDER — BACLOFEN 10 MG PO TABS: 10.0000 mg | ORAL_TABLET | Freq: Three times a day (TID) | ORAL | 0 refills | Status: DC

## 2017-12-03 MED ORDER — PROMETHAZINE HCL 25 MG PO TABS: 25.0000 mg | ORAL_TABLET | Freq: Three times a day (TID) | ORAL | 0 refills | Status: DC | PRN

## 2017-12-03 MED ORDER — PROMETHAZINE HCL 25 MG/ML IJ SOLN
25.0000 mg | Freq: Once | INTRAMUSCULAR | Status: AC
  Administered 2017-12-03: 25 mg via INTRAMUSCULAR

## 2017-12-03 NOTE — Assessment & Plan Note (Addendum)
With associated tension-like headache. I have Toradol here in the office but given her elevated BP it was not recommended. Phenergan 25 mg IM given after verbal consent. I do not think head imaging is needed today. She will continue Imitrex 50 mg to take upon acute onset of headache. Instructed about warning signs. Follow-up in 4 weeks.

## 2017-12-03 NOTE — Telephone Encounter (Signed)
Pt. Reports she stopped her blood pressure medication over a month and also went to [place] last week. When she came back started having headaches and "a little dizziness and nausea." Has started back on her medication this week, but still has elevated readings and symptoms. Appointment made for today with her provider.  Reason for Disposition . Systolic BP  >= 180 OR Diastolic >= 110  Answer Assessment - Initial Assessment Questions 1. BLOOD PRESSURE: "What is the blood pressure?" "Did you take at least two measurements 5 minutes apart?"     145/115 2. ONSET: "When did you take your blood pressure?"     0645 3. HOW: "How did you obtain the blood pressure?" (e.g., visiting nurse, automatic home BP monitor)     Regular cuff 4. HISTORY: "Do you have a history of high blood pressure?"     Yes 5. MEDICATIONS: "Are you taking any medications for blood pressure?" "Have you missed any doses recently?"     Yes - went off her meds x 1 month 6. OTHER SYMPTOMS: "Do you have any symptoms?" (e.g., headache, chest pain, blurred vision, difficulty breathing, weakness)     Headache this week, dizzy, a little nausea 7. PREGNANCY: "Is there any chance you are pregnant?" "When was your last menstrual period?"     Yes  Protocols used: HIGH BLOOD PRESSURE-A-AH
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
## 2017-12-03 NOTE — Assessment & Plan Note (Signed)
Not well controlled. Possible complications of elevated BP discussed. We discussed some side effects of Metoprolol and the importance of compliance with treatment as well as adverse effects when medication is discontinued abruptly. Continue checking BP. Follow-up 4 weeks, before if needed.

## 2018-01-03 ENCOUNTER — Ambulatory Visit: Payer: 59 | Admitting: Family Medicine

## 2018-01-03 NOTE — Progress Notes (Deleted)
HPI:   Meredith Velez is a 32 y.o. female, who is here today to follow on recent OV.   She was seen on 12/03/17.  HTN:  Lab Results  Component Value Date   CREATININE 0.81 01/03/2016   BUN 13 01/03/2016   NA 139 01/03/2016   K 4.3 01/03/2016   CL 104 01/03/2016   CO2 20 01/03/2016    Migraine headache:  ***      Review of Systems    Current Outpatient Medications on File Prior to Visit  Medication Sig Dispense Refill  . acetaminophen (TYLENOL) 500 MG tablet Take 1,000 mg by mouth every 6 (six) hours as needed for headache.    . baclofen (LIORESAL) 10 MG tablet Take 1 tablet (10 mg total) by mouth 3 (three) times daily. 30 each 0  . metoprolol succinate (TOPROL-XL) 100 MG 24 hr tablet TAKE 1 TABLET BY MOUTH DAILY, WITH OR IMMEDIATELY FOLLOWING A MEAL 90 tablet 1  . omeprazole (PRILOSEC) 40 MG capsule Take 1 capsule (40 mg total) by mouth daily before breakfast. 90 capsule 0  . promethazine (PHENERGAN) 25 MG tablet Take 1 tablet (25 mg total) by mouth every 8 (eight) hours as needed for up to 5 days for nausea or vomiting. 15 tablet 0  . SUMAtriptan (IMITREX) 50 MG tablet Take 1 tablet (50 mg total) by mouth daily as needed for migraine or headache. May repeat in 2 hours once if headache persists or recurs. 10 tablet 0  . valACYclovir (VALTREX) 500 MG tablet valacyclovir 500 mg tablet  Take 1 tablet every day by oral route.     No current facility-administered medications on file prior to visit.      Past Medical History:  Diagnosis Date  . Depression   . Frequent headaches   . Hypertension   . Migraines   . UTI (lower urinary tract infection)    No Known Allergies  Social History   Socioeconomic History  . Marital status: Single    Spouse name: Not on file  . Number of children: Not on file  . Years of education: Not on file  . Highest education level: Not on file  Occupational History  . Not on file  Social Needs  . Financial  resource strain: Not on file  . Food insecurity:    Worry: Not on file    Inability: Not on file  . Transportation needs:    Medical: Not on file    Non-medical: Not on file  Tobacco Use  . Smoking status: Never Smoker  . Smokeless tobacco: Never Used  Substance and Sexual Activity  . Alcohol use: No  . Drug use: No  . Sexual activity: Yes    Birth control/protection: None  Lifestyle  . Physical activity:    Days per week: Not on file    Minutes per session: Not on file  . Stress: Not on file  Relationships  . Social connections:    Talks on phone: Not on file    Gets together: Not on file    Attends religious service: Not on file    Active member of club or organization: Not on file    Attends meetings of clubs or organizations: Not on file    Relationship status: Not on file  Other Topics Concern  . Not on file  Social History Narrative  . Not on file    There were no vitals filed for this visit. There is  no height or weight on file to calculate BMI.      Physical Exam  ASSESSMENT AND PLAN:  There are no diagnoses linked to this encounter.   No orders of the defined types were placed in this encounter.   No problem-specific Assessment & Plan notes found for this encounter.      Presley Gora G. [place], MD  [facility]. [place] office.

## 2018-01-07 ENCOUNTER — Ambulatory Visit: Payer: 59 | Admitting: Family Medicine

## 2018-01-07 DIAGNOSIS — Z0289 Encounter for other administrative examinations: Secondary | ICD-10-CM

## 2018-03-08 DIAGNOSIS — L218 Other seborrheic dermatitis: Secondary | ICD-10-CM | POA: Diagnosis not present

## 2018-03-26 DIAGNOSIS — I10 Essential (primary) hypertension: Secondary | ICD-10-CM | POA: Diagnosis not present

## 2018-03-26 DIAGNOSIS — J018 Other acute sinusitis: Secondary | ICD-10-CM | POA: Diagnosis not present

## 2018-04-07 DIAGNOSIS — R030 Elevated blood-pressure reading, without diagnosis of hypertension: Secondary | ICD-10-CM | POA: Diagnosis not present

## 2018-04-07 DIAGNOSIS — R112 Nausea with vomiting, unspecified: Secondary | ICD-10-CM | POA: Diagnosis not present

## 2018-05-02 DIAGNOSIS — N925 Other specified irregular menstruation: Secondary | ICD-10-CM | POA: Diagnosis not present

## 2018-05-06 ENCOUNTER — Other Ambulatory Visit: Payer: Self-pay

## 2018-05-06 ENCOUNTER — Encounter (HOSPITAL_COMMUNITY): Payer: Self-pay | Admitting: *Deleted

## 2018-05-06 ENCOUNTER — Inpatient Hospital Stay (HOSPITAL_COMMUNITY)
Admission: AD | Admit: 2018-05-06 | Discharge: 2018-05-06 | Disposition: A | Discharge status: Home / Self Care | Payer: 59 | Source: Ambulatory Visit | Attending: Obstetrics and Gynecology | Admitting: Obstetrics and Gynecology

## 2018-05-06 DIAGNOSIS — O99341 Other mental disorders complicating pregnancy, first trimester: Secondary | ICD-10-CM | POA: Insufficient documentation

## 2018-05-06 DIAGNOSIS — F329 Major depressive disorder, single episode, unspecified: Secondary | ICD-10-CM | POA: Insufficient documentation

## 2018-05-06 DIAGNOSIS — O26891 Other specified pregnancy related conditions, first trimester: Secondary | ICD-10-CM

## 2018-05-06 DIAGNOSIS — N925 Other specified irregular menstruation: Secondary | ICD-10-CM | POA: Diagnosis not present

## 2018-05-06 DIAGNOSIS — Z79899 Other long term (current) drug therapy: Secondary | ICD-10-CM | POA: Diagnosis not present

## 2018-05-06 DIAGNOSIS — Z3A12 12 weeks gestation of pregnancy: Secondary | ICD-10-CM | POA: Diagnosis not present

## 2018-05-06 DIAGNOSIS — O99611 Diseases of the digestive system complicating pregnancy, first trimester: Secondary | ICD-10-CM | POA: Diagnosis not present

## 2018-05-06 DIAGNOSIS — Z8711 Personal history of peptic ulcer disease: Secondary | ICD-10-CM | POA: Insufficient documentation

## 2018-05-06 DIAGNOSIS — K648 Other hemorrhoids: Secondary | ICD-10-CM | POA: Diagnosis not present

## 2018-05-06 DIAGNOSIS — O161 Unspecified maternal hypertension, first trimester: Secondary | ICD-10-CM | POA: Diagnosis not present

## 2018-05-06 DIAGNOSIS — K5641 Fecal impaction: Secondary | ICD-10-CM | POA: Diagnosis not present

## 2018-05-06 NOTE — MAU Provider Note (Signed)
History     CSN: [id number]  Arrival date and time: 05/06/18 1119   First Provider Initiated Contact with Patient 05/06/18 1254      Chief Complaint  Patient presents with  . Fecal Impaction  . Rectal Pain   HPI Meredith Velez 32 y.o. [redacted]w[redacted]d Sent from the office for a fecal impaction.  Has not had a BM in several days.  Was taking Zofran for vomiting BID until yesterday.  Was not taking any PO stool softener.  Before pregnancy, she had a history of periodic constipation and using Suppositories to have a BM.  Was seen in the office today and had 2 rectal exams and a pelvic exam stating she had an impaction.  Was sent here for treatment.  OB History    Gravida  2   Para  1   Term  1   Preterm  0   AB  0   Living  1     SAB  0   TAB  0   Ectopic  0   Multiple  0   Live Births  1           Past Medical History:  Diagnosis Date  . Depression   . Frequent headaches   . Hypertension   . Migraines   . UTI (lower urinary tract infection)     Past Surgical History:  Procedure Laterality Date  . NO PAST SURGERIES      Family History  Problem Relation Age of Onset  . Hypertension Mother   . Hyperlipidemia Mother   . Heart disease Mother   . Kidney disease Mother   . Diabetes Mother   . Hypertension Father   . Stroke Father   . Diabetes Father   . Hypertension Brother   . Breast cancer Maternal Aunt   . Lung cancer Maternal Uncle   . Prostate cancer Maternal Grandmother   . Prostate cancer Maternal Grandfather   . Alzheimer's disease Paternal Grandmother     Social History   Tobacco Use  . Smoking status: Never Smoker  . Smokeless tobacco: Never Used  Substance Use Topics  . Alcohol use: No  . Drug use: No    Allergies: No Known Allergies  Medications Prior to Admission  Medication Sig Dispense Refill Last Dose  . acetaminophen (TYLENOL) 500 MG tablet Take 1,000 mg by mouth every 6 (six) hours as needed for headache.   Taking  .  baclofen (LIORESAL) 10 MG tablet Take 1 tablet (10 mg total) by mouth 3 (three) times daily. 30 each 0   . metoprolol succinate (TOPROL-XL) 100 MG 24 hr tablet TAKE 1 TABLET BY MOUTH DAILY, WITH OR IMMEDIATELY FOLLOWING A MEAL 90 tablet 1 Taking  . omeprazole (PRILOSEC) 40 MG capsule Take 1 capsule (40 mg total) by mouth daily before breakfast. 90 capsule 0 Taking  . promethazine (PHENERGAN) 25 MG tablet Take 1 tablet (25 mg total) by mouth every 8 (eight) hours as needed for up to 5 days for nausea or vomiting. 15 tablet 0   . SUMAtriptan (IMITREX) 50 MG tablet Take 1 tablet (50 mg total) by mouth daily as needed for migraine or headache. May repeat in 2 hours once if headache persists or recurs. 10 tablet 0 Taking  . valACYclovir (VALTREX) 500 MG tablet valacyclovir 500 mg tablet  Take 1 tablet every day by oral route.   Taking    Review of Systems  Constitutional: Negative for fever.  Gastrointestinal: Positive  for abdominal pain, constipation and nausea. Negative for diarrhea and vomiting.  Genitourinary: Negative for dysuria, vaginal bleeding and vaginal discharge.   Physical Exam   Blood pressure (!) 144/106, pulse (!) 104, temperature 97.9 F (36.6 C), temperature source Oral, resp. rate 20, weight 76.3 kg, SpO2 100 %.    Physical Exam  Nursing note and vitals reviewed. Constitutional: She is oriented to person, place, and time. She appears well-developed and well-nourished.  HENT:  Head: Normocephalic.  Eyes: EOM are normal.  Neck: Neck supple.  Respiratory: Effort normal.  GI: Soft. There is no tenderness. There is no rebound and no guarding.  Musculoskeletal: Normal range of motion.  Neurological: She is alert and oriented to person, place, and time.  Skin: Skin is warm and dry.  Psychiatric: She has a normal mood and affect.    MAU Course  Procedures  MDM Had a soap suds enema with good results and client is feeling much better.  She is ready to go home and lots of  instruction was given on types of foods to eat to avoid constipation.  Also given a "clean out" regimine for Miralax and advised to use Miralax as needed to have a regular BM at least once a day for now unless she is having loose stools.  BP rechecked at discharge 139/100 - continues to be elevated as on admission, but client has not had her BP meds today - did not take them due to feeling full.  Will take her dose when she leaves today.   Assessment and Plan  Fecal impaction and constipation [redacted]w[redacted]d pregnant  Plan Use a PO stool softener Use Miralax by mouth daily Has instructions given to her at the office also. Regular diet but avoid high fat. Make sure to get another Prenatal visit scheduled in the office. See also AVS for additional info given to client.  Terri L Burleson 05/06/2018, 3:24 PM

## 2018-05-06 NOTE — Progress Notes (Signed)
SS Enema administered by A. Francoise Schaumann, NT.  1200cc fluid infused & tolerated by pt.

## 2018-05-06 NOTE — Discharge Instructions (Signed)

## 2018-05-06 NOTE — Progress Notes (Signed)
Pt reports had good results from enema.  Reports feels "empty".

## 2018-05-06 NOTE — MAU Note (Signed)
Dr's office sent her over, has an impaction.  Having rectal pain and pressure.  Hx of hemorrhoids. Unable to remove in office, due to pain.  Last BM was small and on Sunday.  Tried a fleet on Sunday, also suppository; minimal results.Marland Kitchen

## 2018-05-06 NOTE — MAU Note (Signed)
Urine in lab 

## 2018-05-09 DIAGNOSIS — Z3490 Encounter for supervision of normal pregnancy, unspecified, unspecified trimester: Secondary | ICD-10-CM | POA: Insufficient documentation

## 2018-05-17 DIAGNOSIS — Z369 Encounter for antenatal screening, unspecified: Secondary | ICD-10-CM | POA: Diagnosis not present

## 2018-05-17 DIAGNOSIS — Z3402 Encounter for supervision of normal first pregnancy, second trimester: Secondary | ICD-10-CM | POA: Diagnosis not present

## 2018-05-17 DIAGNOSIS — Z3A14 14 weeks gestation of pregnancy: Secondary | ICD-10-CM | POA: Diagnosis not present

## 2018-05-17 LAB — OB RESULTS CONSOLE HIV ANTIBODY (ROUTINE TESTING): HIV: NONREACTIVE

## 2018-05-17 LAB — OB RESULTS CONSOLE ABO/RH: RH Type: POSITIVE

## 2018-05-17 LAB — OB RESULTS CONSOLE GC/CHLAMYDIA
Chlamydia: NEGATIVE
Gonorrhea: NEGATIVE

## 2018-05-17 LAB — OB RESULTS CONSOLE RPR: RPR: NONREACTIVE

## 2018-05-17 LAB — OB RESULTS CONSOLE ANTIBODY SCREEN: Antibody Screen: NEGATIVE

## 2018-05-17 LAB — OB RESULTS CONSOLE RUBELLA ANTIBODY, IGM: Rubella: IMMUNE

## 2018-05-17 LAB — OB RESULTS CONSOLE HEPATITIS B SURFACE ANTIGEN: Hepatitis B Surface Ag: NEGATIVE

## 2018-05-18 DIAGNOSIS — N898 Other specified noninflammatory disorders of vagina: Secondary | ICD-10-CM | POA: Diagnosis not present

## 2018-05-28 ENCOUNTER — Inpatient Hospital Stay (HOSPITAL_COMMUNITY)
Admission: AD | Admit: 2018-05-28 | Discharge: 2018-05-28 | DRG: 833 | Disposition: A | Discharge status: Home / Self Care | Payer: 59 | Source: Ambulatory Visit | Attending: Obstetrics & Gynecology | Admitting: Obstetrics & Gynecology

## 2018-05-28 ENCOUNTER — Encounter (HOSPITAL_COMMUNITY): Payer: Self-pay | Admitting: *Deleted

## 2018-05-28 ENCOUNTER — Encounter (HOSPITAL_COMMUNITY): Payer: Self-pay | Admitting: Anesthesiology

## 2018-05-28 DIAGNOSIS — O10012 Pre-existing essential hypertension complicating pregnancy, second trimester: Principal | ICD-10-CM | POA: Diagnosis present

## 2018-05-28 DIAGNOSIS — O219 Vomiting of pregnancy, unspecified: Secondary | ICD-10-CM | POA: Diagnosis not present

## 2018-05-28 DIAGNOSIS — Z3A15 15 weeks gestation of pregnancy: Secondary | ICD-10-CM | POA: Diagnosis not present

## 2018-05-28 DIAGNOSIS — O26892 Other specified pregnancy related conditions, second trimester: Secondary | ICD-10-CM | POA: Diagnosis not present

## 2018-05-28 DIAGNOSIS — R51 Headache: Secondary | ICD-10-CM | POA: Diagnosis present

## 2018-05-28 DIAGNOSIS — O210 Mild hyperemesis gravidarum: Secondary | ICD-10-CM | POA: Diagnosis present

## 2018-05-28 DIAGNOSIS — I10 Essential (primary) hypertension: Secondary | ICD-10-CM

## 2018-05-28 DIAGNOSIS — O10912 Unspecified pre-existing hypertension complicating pregnancy, second trimester: Secondary | ICD-10-CM | POA: Diagnosis not present

## 2018-05-28 DIAGNOSIS — O30043 Twin pregnancy, dichorionic/diamniotic, third trimester: Secondary | ICD-10-CM | POA: Diagnosis present

## 2018-05-28 LAB — URINALYSIS, ROUTINE W REFLEX MICROSCOPIC
Bilirubin Urine: NEGATIVE
Glucose, UA: NEGATIVE mg/dL
HGB URINE DIPSTICK: NEGATIVE
Ketones, ur: NEGATIVE mg/dL
LEUKOCYTES UA: NEGATIVE
Nitrite: NEGATIVE
PROTEIN: NEGATIVE mg/dL
Specific Gravity, Urine: 1.019 (ref 1.005–1.030)
pH: 6 (ref 5.0–8.0)

## 2018-05-28 LAB — CBC
HCT: 34.5 % — ABNORMAL LOW (ref 36.0–46.0)
Hemoglobin: 11.5 g/dL — ABNORMAL LOW (ref 12.0–15.0)
MCH: 31.4 pg (ref 26.0–34.0)
MCHC: 33.3 g/dL (ref 30.0–36.0)
MCV: 94.3 fL (ref 80.0–100.0)
NRBC: 0 % (ref 0.0–0.2)
PLATELETS: 317 10*3/uL (ref 150–400)
RBC: 3.66 MIL/uL — AB (ref 3.87–5.11)
RDW: 13.6 % (ref 11.5–15.5)
WBC: 11.1 10*3/uL — ABNORMAL HIGH (ref 4.0–10.5)

## 2018-05-28 LAB — COMPREHENSIVE METABOLIC PANEL
ALT: 17 U/L (ref 0–44)
ANION GAP: 9 (ref 5–15)
AST: 17 U/L (ref 15–41)
Albumin: 3.5 g/dL (ref 3.5–5.0)
Alkaline Phosphatase: 47 U/L (ref 38–126)
BUN: 7 mg/dL (ref 6–20)
CHLORIDE: 103 mmol/L (ref 98–111)
CO2: 22 mmol/L (ref 22–32)
Calcium: 8.7 mg/dL — ABNORMAL LOW (ref 8.9–10.3)
Creatinine, Ser: 0.5 mg/dL (ref 0.44–1.00)
GLUCOSE: 85 mg/dL (ref 70–99)
POTASSIUM: 3.6 mmol/L (ref 3.5–5.1)
SODIUM: 134 mmol/L — AB (ref 135–145)
Total Bilirubin: 0.8 mg/dL (ref 0.3–1.2)
Total Protein: 7.5 g/dL (ref 6.5–8.1)

## 2018-05-28 LAB — PROTEIN / CREATININE RATIO, URINE
Creatinine, Urine: 181 mg/dL
Protein Creatinine Ratio: 0.08 mg/mg{Cre} (ref 0.00–0.15)
Total Protein, Urine: 14 mg/dL

## 2018-05-28 MED ORDER — ACETAMINOPHEN 325 MG PO TABS: 650.0000 mg | ORAL_TABLET | ORAL | Status: DC | PRN

## 2018-05-28 MED ORDER — PRENATAL MULTIVITAMIN CH: 1.0000 | ORAL_TABLET | Freq: Every day | ORAL | Status: DC

## 2018-05-28 MED ORDER — MAGNESIUM SULFATE BOLUS VIA INFUSION: 6.0000 g | Freq: Once | INTRAVENOUS | Status: DC

## 2018-05-28 MED ORDER — POTASSIUM CHLORIDE IN NACL 20-0.9 MEQ/L-% IV SOLN: INTRAVENOUS | Status: DC

## 2018-05-28 MED ORDER — DEXAMETHASONE SODIUM PHOSPHATE 10 MG/ML IJ SOLN
10.0000 mg | Freq: Once | INTRAMUSCULAR | Status: AC
  Administered 2018-05-28: 10 mg via INTRAVENOUS
  Filled 2018-05-28: qty 1

## 2018-05-28 MED ORDER — ZOLPIDEM TARTRATE 5 MG PO TABS: 5.0000 mg | ORAL_TABLET | Freq: Every evening | ORAL | Status: DC | PRN

## 2018-05-28 MED ORDER — NIFEDIPINE 10 MG PO CAPS
10.0000 mg | ORAL_CAPSULE | Freq: Once | ORAL | Status: AC
  Administered 2018-05-28: 10 mg via ORAL
  Filled 2018-05-28: qty 1

## 2018-05-28 MED ORDER — LACTATED RINGERS IV BOLUS
1000.0000 mL | Freq: Once | INTRAVENOUS | Status: AC
  Administered 2018-05-28: 1000 mL via INTRAVENOUS

## 2018-05-28 MED ORDER — MAGNESIUM SULFATE 40 G IN LACTATED RINGERS - SIMPLE: 2.0000 g/h | INTRAVENOUS | Status: DC

## 2018-05-28 MED ORDER — CALCIUM CARBONATE ANTACID 500 MG PO CHEW: 2.0000 | CHEWABLE_TABLET | ORAL | Status: DC | PRN

## 2018-05-28 MED ORDER — PROMETHAZINE HCL 25 MG/ML IJ SOLN
25.0000 mg | Freq: Once | INTRAMUSCULAR | Status: AC
  Administered 2018-05-28: 25 mg via INTRAVENOUS
  Filled 2018-05-28: qty 1

## 2018-05-28 MED ORDER — ONDANSETRON 8 MG PO TBDP: 8.0000 mg | ORAL_TABLET | Freq: Three times a day (TID) | ORAL | 0 refills | Status: DC | PRN

## 2018-05-28 MED ORDER — DOCUSATE SODIUM 100 MG PO CAPS: 100.0000 mg | ORAL_CAPSULE | Freq: Every day | ORAL | Status: DC

## 2018-05-28 MED ORDER — PROMETHAZINE HCL 25 MG PO TABS: 12.5000 mg | ORAL_TABLET | Freq: Four times a day (QID) | ORAL | 0 refills | Status: DC | PRN

## 2018-05-28 MED ORDER — DIPHENHYDRAMINE HCL 50 MG/ML IJ SOLN
25.0000 mg | Freq: Once | INTRAMUSCULAR | Status: AC
  Administered 2018-05-28: 25 mg via INTRAVENOUS
  Filled 2018-05-28: qty 1

## 2018-05-28 NOTE — Discharge Instructions (Signed)
Second Trimester of Pregnancy The second trimester is from week 13 through week 28, month 4 through 6. This is often the time in pregnancy that you feel your best. Often times, morning sickness has lessened or quit. You may have more energy, and you may get hungry more often. Your unborn baby (fetus) is growing rapidly. At the end of the sixth month, he or she is about 9 inches long and weighs about 1 pounds. You will likely feel the baby move (quickening) between 18 and 20 weeks of pregnancy. Follow these instructions at home:  Avoid all smoking, herbs, and alcohol. Avoid drugs not approved by your doctor.  Do not use any tobacco products, including cigarettes, chewing tobacco, and electronic cigarettes. If you need help quitting, ask your doctor. You may get counseling or other support to help you quit.  Only take medicine as told by your doctor. Some medicines are safe and some are not during pregnancy.  Exercise only as told by your doctor. Stop exercising if you start having cramps.  Eat regular, healthy meals.  Wear a good support bra if your breasts are tender.  Do not use hot tubs, steam rooms, or saunas.  Wear your seat belt when driving.  Avoid raw meat, uncooked cheese, and liter boxes and soil used by cats.  Take your prenatal vitamins.  Take 1500-2000 milligrams of calcium daily starting at the 20th week of pregnancy until you deliver your baby.  Try taking medicine that helps you poop (stool softener) as needed, and if your doctor approves. Eat more fiber by eating fresh fruit, vegetables, and whole grains. Drink enough fluids to keep your pee (urine) clear or pale yellow.  Take warm water baths (sitz baths) to soothe pain or discomfort caused by hemorrhoids. Use hemorrhoid cream if your doctor approves.  If you have puffy, bulging veins (varicose veins), wear support hose. Raise (elevate) your feet for 15 minutes, 3-4 times a day. Limit salt in your diet.  Avoid heavy  lifting, wear low heals, and sit up straight.  Rest with your legs raised if you have leg cramps or low back pain.  Visit your dentist if you have not gone during your pregnancy. Use a soft toothbrush to brush your teeth. Be gentle when you floss.  You can have sex (intercourse) unless your doctor tells you not to.  Go to your doctor visits. Get help if:  You feel dizzy.  You have mild cramps or pressure in your lower belly (abdomen).  You have a nagging pain in your belly area.  You continue to feel sick to your stomach (nauseous), throw up (vomit), or have watery poop (diarrhea).  You have bad smelling fluid coming from your vagina.  You have pain with peeing (urination). Get help right away if:  You have a fever.  You are leaking fluid from your vagina.  You have spotting or bleeding from your vagina.  You have severe belly cramping or pain.  You lose or gain weight rapidly.  You have trouble catching your breath and have chest pain.  You notice sudden or extreme puffiness (swelling) of your face, hands, ankles, feet, or legs.  You have not felt the baby move in over an hour.  You have severe headaches that do not go away with medicine.  You have vision changes. This information is not intended to replace advice given to you by your health care provider. Make sure you discuss any questions you have with your health care   provider. Document Released: 09/16/2009 Document Revised: 11/28/2015 Document Reviewed: 08/23/2012 Elsevier Interactive Patient Education  2017 [employer]  

## 2018-05-28 NOTE — MAU Provider Note (Signed)
History     CSN: [id number]  Arrival date and time: 05/28/18 0702   First Provider Initiated Contact with Patient 05/28/18 [phone]      Chief Complaint  Patient presents with  . Headache  . Emesis   Meredith Velez is a 32 y.o. G2P1001 with CHTN on procardia at [redacted]w[redacted]d who presents today with nausea, vomiting and headache. She has had nausea and vomiting for the entire pregnancy, but it has been worse the last few days. She has not taken her blood pressure medication today. Patient reports that she is taking 10mg  procardia daily at this time. She states she took it yesterday, but is not sure she kept it down.   Headache   This is a new problem. The current episode started yesterday. The problem occurs constantly. The problem has been gradually worsening. The pain is located in the frontal and occipital region. The pain does not radiate. The pain quality is similar to prior headaches. The pain is at a severity of 7/10. Associated symptoms include vomiting. Nothing aggravates the symptoms. She has tried nothing for the symptoms.  Emesis   This is a new problem. The current episode started more than 1 month ago. The problem occurs 2 to 4 times per day. The problem has been gradually worsening. The emesis has an appearance of stomach contents. There has been no fever. Associated symptoms include headaches. Pertinent negatives include no diarrhea.    OB History    Gravida  2   Para  1   Term  1   Preterm  0   AB  0   Living  1     SAB  0   TAB  0   Ectopic  0   Multiple  0   Live Births  1           Past Medical History:  Diagnosis Date  . Depression   . Frequent headaches   . Hypertension   . Migraines   . UTI (lower urinary tract infection)     Past Surgical History:  Procedure Laterality Date  . NO PAST SURGERIES      Family History  Problem Relation Age of Onset  . Hypertension Mother   . Hyperlipidemia Mother   . Heart disease Mother   . Kidney  disease Mother   . Diabetes Mother   . Hypertension Father   . Stroke Father   . Diabetes Father   . Hypertension Brother   . Breast cancer Maternal Aunt   . Lung cancer Maternal Uncle   . Prostate cancer Maternal Grandmother   . Prostate cancer Maternal Grandfather   . Alzheimer's disease Paternal Grandmother     Social History   Tobacco Use  . Smoking status: Never Smoker  . Smokeless tobacco: Never Used  Substance Use Topics  . Alcohol use: No  . Drug use: No    Allergies: No Known Allergies  Medications Prior to Admission  Medication Sig Dispense Refill Last Dose  . acetaminophen (TYLENOL) 500 MG tablet Take 1,000 mg by mouth every 6 (six) hours as needed for headache.   Taking  . omeprazole (PRILOSEC) 40 MG capsule Take 1 capsule (40 mg total) by mouth daily before breakfast. 90 capsule 0 Taking  . valACYclovir (VALTREX) 500 MG tablet valacyclovir 500 mg tablet  Take 1 tablet every day by oral route.   Taking    Review of Systems  Gastrointestinal: Positive for vomiting. Negative for diarrhea.  Neurological: Positive  for headaches.   Physical Exam   Blood pressure (!) 142/101, pulse 88, temperature 97.7 F (36.5 C), temperature source Oral, resp. rate 17, weight 75.4 kg, SpO2 100 %.  Physical Exam  Nursing note and vitals reviewed. Constitutional: She is oriented to person, place, and time. She appears well-developed and well-nourished. No distress.  HENT:  Head: Normocephalic.  Cardiovascular: Normal rate.  Respiratory: Effort normal.  GI: Soft. There is no tenderness. There is no rebound.  Genitourinary:  Genitourinary Comments: Fundus approx midway b/w SP and umbilicus +FHT 157 with doppler   Neurological: She is alert and oriented to person, place, and time.  Skin: Skin is warm and dry.  Psychiatric: She has a normal mood and affect.   Results for orders placed or performed during the hospital encounter of 05/28/18 (from the past 24 hour(s))  Protein  / creatinine ratio, urine     Status: None   Collection Time: 05/28/18  7:47 AM  Result Value Ref Range   Creatinine, Urine 181.00 mg/dL   Total Protein, Urine 14 mg/dL   Protein Creatinine Ratio 0.08 0.00 - 0.15 mg/mg[Cre]  Urinalysis, Routine w reflex microscopic     Status: Abnormal   Collection Time: 05/28/18  7:48 AM  Result Value Ref Range   Color, Urine YELLOW YELLOW   APPearance CLOUDY (A) CLEAR   Specific Gravity, Urine 1.019 1.005 - 1.030   pH 6.0 5.0 - 8.0   Glucose, UA NEGATIVE NEGATIVE mg/dL   Hgb urine dipstick NEGATIVE NEGATIVE   Bilirubin Urine NEGATIVE NEGATIVE   Ketones, ur NEGATIVE NEGATIVE mg/dL   Protein, ur NEGATIVE NEGATIVE mg/dL   Nitrite NEGATIVE NEGATIVE   Leukocytes, UA NEGATIVE NEGATIVE  CBC     Status: Abnormal   Collection Time: 05/28/18  9:29 AM  Result Value Ref Range   WBC 11.1 (H) 4.0 - 10.5 K/uL   RBC 3.66 (L) 3.87 - 5.11 MIL/uL   Hemoglobin 11.5 (L) 12.0 - 15.0 g/dL   HCT 40.9 (L) 81.1 - 91.4 %   MCV 94.3 80.0 - 100.0 fL   MCH 31.4 26.0 - 34.0 pg   MCHC 33.3 30.0 - 36.0 g/dL   RDW 78.2 95.6 - 21.3 %   Platelets 317 150 - 400 K/uL   nRBC 0.0 0.0 - 0.2 %  Comprehensive metabolic panel     Status: Abnormal   Collection Time: 05/28/18  9:29 AM  Result Value Ref Range   Sodium 134 (L) 135 - 145 mmol/L   Potassium 3.6 3.5 - 5.1 mmol/L   Chloride 103 98 - 111 mmol/L   CO2 22 22 - 32 mmol/L   Glucose, Bld 85 70 - 99 mg/dL   BUN 7 6 - 20 mg/dL   Creatinine, Ser 0.86 0.44 - 1.00 mg/dL   Calcium 8.7 (L) 8.9 - 10.3 mg/dL   Total Protein 7.5 6.5 - 8.1 g/dL   Albumin 3.5 3.5 - 5.0 g/dL   AST 17 15 - 41 U/L   ALT 17 0 - 44 U/L   Alkaline Phosphatase 47 38 - 126 U/L   Total Bilirubin 0.8 0.3 - 1.2 mg/dL   GFR calc non Af Amer >60 >60 mL/min   GFR calc Af Amer >60 >60 mL/min   Anion gap 9 5 - 15    MAU Course  Procedures  MDM Patient has had 1L or LR and migraine cocktail. She reports that her headache has resolved. She is no longer  vomiting and is tolerating PO.  Assessment and Plan   1. Pregnancy headache in second trimester   2. Nausea/vomiting in pregnancy   3. [redacted] weeks gestation of pregnancy   4. Essential hypertension    DC home Comfort measures reviewed  2nd Trimester precautions  Bleeding precautions Ectopic precautions PTL precautions  Fetal kick counts RX: zofran PRN #20, Phenergan PRN #30  Return to MAU as needed FU with OB as planned  Follow-up Information    [facility] Follow up.   Specialty:  Obstetrics and Gynecology Contact information: [address] [place] [address] [phone]           Thressa Sheller 05/28/2018, 8:44 AM

## 2018-05-28 NOTE — MAU Note (Signed)
Pt. States she is feeling a little better

## 2018-05-28 NOTE — MAU Note (Signed)
Meredith Velez is a 32 y.o. at [redacted]w[redacted]d here in MAU reporting: +headache +emesis; not able to keep anything down--took BP med yesterday but not today--unsure if it stayed down. Onset of complaint: started yesterday around 3-4pm. Headache woke her up this am.  Pain score: 6-7/10. Has not taken any medication for the pain. +hypertension--patient states manual BP at home was 159/115. Called her ob and they told her to come in for eval. Vitals:   05/28/18 0744  BP: (!) 142/101  Pulse: 88  Resp: 17  Temp: 97.7 F (36.5 C)  SpO2: 100%     FHT:157 via doppler Lab orders placed from triage: ua

## 2018-06-10 DIAGNOSIS — I10 Essential (primary) hypertension: Secondary | ICD-10-CM | POA: Diagnosis not present

## 2018-06-10 DIAGNOSIS — J111 Influenza due to unidentified influenza virus with other respiratory manifestations: Secondary | ICD-10-CM | POA: Diagnosis not present

## 2018-06-15 ENCOUNTER — Other Ambulatory Visit (HOSPITAL_COMMUNITY): Payer: Self-pay | Admitting: Obstetrics & Gynecology

## 2018-06-15 ENCOUNTER — Encounter (HOSPITAL_COMMUNITY): Payer: Self-pay

## 2018-06-15 DIAGNOSIS — Z3689 Encounter for other specified antenatal screening: Secondary | ICD-10-CM

## 2018-06-20 ENCOUNTER — Inpatient Hospital Stay (HOSPITAL_COMMUNITY)
Admission: AD | Admit: 2018-06-20 | Discharge: 2018-06-20 | Disposition: A | Discharge status: Home / Self Care | Payer: 59 | Source: Ambulatory Visit | Attending: Obstetrics & Gynecology | Admitting: Obstetrics & Gynecology

## 2018-06-20 ENCOUNTER — Encounter (HOSPITAL_COMMUNITY): Payer: Self-pay

## 2018-06-20 ENCOUNTER — Encounter (HOSPITAL_COMMUNITY): Payer: Self-pay | Admitting: Emergency Medicine

## 2018-06-20 DIAGNOSIS — R51 Headache: Secondary | ICD-10-CM | POA: Insufficient documentation

## 2018-06-20 DIAGNOSIS — Z3A19 19 weeks gestation of pregnancy: Secondary | ICD-10-CM | POA: Diagnosis not present

## 2018-06-20 DIAGNOSIS — O26892 Other specified pregnancy related conditions, second trimester: Secondary | ICD-10-CM | POA: Insufficient documentation

## 2018-06-20 DIAGNOSIS — O10012 Pre-existing essential hypertension complicating pregnancy, second trimester: Secondary | ICD-10-CM | POA: Diagnosis not present

## 2018-06-20 DIAGNOSIS — H9202 Otalgia, left ear: Secondary | ICD-10-CM | POA: Insufficient documentation

## 2018-06-20 DIAGNOSIS — H6692 Otitis media, unspecified, left ear: Secondary | ICD-10-CM

## 2018-06-20 LAB — URINALYSIS, ROUTINE W REFLEX MICROSCOPIC
Bilirubin Urine: NEGATIVE
Glucose, UA: NEGATIVE mg/dL
Hgb urine dipstick: NEGATIVE
Ketones, ur: NEGATIVE mg/dL
Leukocytes, UA: NEGATIVE
Nitrite: NEGATIVE
Protein, ur: NEGATIVE mg/dL
Specific Gravity, Urine: 1.01 (ref 1.005–1.030)
pH: 7 (ref 5.0–8.0)

## 2018-06-20 LAB — COMPREHENSIVE METABOLIC PANEL
ALK PHOS: 59 U/L (ref 38–126)
ALT: 41 U/L (ref 0–44)
AST: 30 U/L (ref 15–41)
Albumin: 3.6 g/dL (ref 3.5–5.0)
Anion gap: 8 (ref 5–15)
BILIRUBIN TOTAL: 0.6 mg/dL (ref 0.3–1.2)
BUN: 5 mg/dL — ABNORMAL LOW (ref 6–20)
CALCIUM: 8.9 mg/dL (ref 8.9–10.3)
CO2: 22 mmol/L (ref 22–32)
CREATININE: 0.54 mg/dL (ref 0.44–1.00)
Chloride: 102 mmol/L (ref 98–111)
GFR calc non Af Amer: >60 mL/min (ref >60)
Glucose, Bld: 91 mg/dL (ref 70–99)
Potassium: 3.6 mmol/L (ref 3.5–5.1)
Sodium: 132 mmol/L — ABNORMAL LOW (ref 135–145)
TOTAL PROTEIN: 7.4 g/dL (ref 6.5–8.1)

## 2018-06-20 LAB — CBC
HCT: 34.5 % — ABNORMAL LOW (ref 36.0–46.0)
Hemoglobin: 11.5 g/dL — ABNORMAL LOW (ref 12.0–15.0)
MCH: 31.2 pg (ref 26.0–34.0)
MCHC: 33.3 g/dL (ref 30.0–36.0)
MCV: 93.5 fL (ref 80.0–100.0)
Platelets: 366 10*3/uL (ref 150–400)
RBC: 3.69 MIL/uL — ABNORMAL LOW (ref 3.87–5.11)
RDW: 13 % (ref 11.5–15.5)
WBC: 14 10*3/uL — ABNORMAL HIGH (ref 4.0–10.5)

## 2018-06-20 LAB — PROTEIN / CREATININE RATIO, URINE
Creatinine, Urine: 41 mg/dL
Total Protein, Urine: <6 mg/dL

## 2018-06-20 MED ORDER — ACETAMINOPHEN 500 MG PO TABS
1000.0000 mg | ORAL_TABLET | Freq: Once | ORAL | Status: AC
  Administered 2018-06-20: 1000 mg via ORAL
  Filled 2018-06-20: qty 2

## 2018-06-20 MED ORDER — OXYCODONE HCL 5 MG PO TABS
10.0000 mg | ORAL_TABLET | Freq: Once | ORAL | Status: AC
  Administered 2018-06-20: 10 mg via ORAL
  Filled 2018-06-20: qty 2

## 2018-06-20 NOTE — MAU Note (Signed)
Pt presents to MAU with co HA she rates 7/10 that is throbbing and unrelieved with ibuprofen. Pt also co of left ear pain that she rates 8/10 that started two days ago. Pt states she took her bp at home at 0000 and it was 160/110 . Pt denies VB, LOF. +FM.

## 2018-06-20 NOTE — MAU Provider Note (Signed)
History    Meredith Velez is a 32 year old G2P1 at [redacted]w[redacted]d who presents this morning for moderate left earache (8/10), headache 7/10, and elevated B/P at home. She is a chronic hypertensive who takes Procardia 30mg  XL since 2014. She was out of work all week last week for flu, treated presumptively with Tamiflu by [facility].   Patient Active Problem List   Diagnosis Date Noted  . Dichorionic diamniotic twin pregnancy in third trimester 05/28/2018  . Migraine headache without aura 02/28/2016  . Allergic rhinitis 02/28/2016  . GERD (gastroesophageal reflux disease) 07/12/2014  . Essential hypertension 02/21/2014    Chief Complaint  Patient presents with  . Headache  . Hypertension    Headache   This is a new problem. The current episode started today. The problem occurs constantly. The problem has been unchanged. The pain is located in the left unilateral, occipital and parietal region. Radiates to: top of head. The pain quality is not similar to prior headaches. The quality of the pain is described as aching. The pain is at a severity of 8/10. The pain is moderate. Associated symptoms include ear pain. She has tried NSAIDs for the symptoms. The treatment provided no relief. Her past medical history is significant for hypertension.  Hypertension  Associated symptoms include headaches.    OB History    Gravida  2   Para  1   Term  1   Preterm  0   AB  0   Living  1     SAB  0   TAB  0   Ectopic  0   Multiple  0   Live Births  1           Past Medical History:  Diagnosis Date  . Depression   . Frequent headaches   . Hypertension   . Migraines   . UTI (lower urinary tract infection)     Past Surgical History:  Procedure Laterality Date  . NO PAST SURGERIES      Family History  Problem Relation Age of Onset  . Hypertension Mother   . Hyperlipidemia Mother   . Heart disease Mother   . Kidney disease Mother   . Diabetes Mother   . Hypertension Father   .  Stroke Father   . Diabetes Father   . Hypertension Brother   . Breast cancer Maternal Aunt   . Lung cancer Maternal Uncle   . Prostate cancer Maternal Grandmother   . Prostate cancer Maternal Grandfather   . Alzheimer's disease Paternal Grandmother     Social History   Tobacco Use  . Smoking status: Never Smoker  . Smokeless tobacco: Never Used  Substance Use Topics  . Alcohol use: No  . Drug use: No    Allergies: No Known Allergies  Medications Prior to Admission  Medication Sig Dispense Refill Last Dose  . acetaminophen (TYLENOL) 500 MG tablet Take 1,000 mg by mouth every 6 (six) hours as needed for headache.   More than a month at Unknown time  . omeprazole (PRILOSEC) 40 MG capsule Take 1 capsule (40 mg total) by mouth daily before breakfast. 90 capsule 0 More than a month at Unknown time  . ondansetron (ZOFRAN ODT) 8 MG disintegrating tablet Take 1 tablet (8 mg total) by mouth every 8 (eight) hours as needed for nausea or vomiting. 20 tablet 0 More than a month at Unknown time  . promethazine (PHENERGAN) 25 MG tablet Take 0.5-1 tablets (12.5-25 mg total)  by mouth every 6 (six) hours as needed. 30 tablet 0 More than a month at Unknown time  . valACYclovir (VALTREX) 500 MG tablet valacyclovir 500 mg tablet  Take 1 tablet every day by oral route.   More than a month at Unknown time    Review of Systems  Constitutional: Negative.   HENT: Positive for ear discharge and ear pain.   Eyes: Negative.   Respiratory: Negative.   Cardiovascular: Negative.   Gastrointestinal: Negative.   Genitourinary: Negative.   Musculoskeletal: Negative.   Skin: Negative.   Neurological: Positive for headaches.  Endo/Heme/Allergies: Negative.   Psychiatric/Behavioral: Negative.    Physical Exam   Blood pressure (!) 146/99, pulse 95, temperature 98 F (36.7 C), temperature source Oral, resp. rate 19, SpO2 99 %.  Vitals:   06/20/18 0117 06/20/18 0131 06/20/18 0146 06/20/18 0201  BP: (!)  140/96 132/85 (!) 148/95 (!) 146/99  Pulse: 94 91 (!) 101 95  Resp: 19     Temp: 98 F (36.7 C)     TempSrc: Oral     SpO2:        Physical Exam  Constitutional: She is oriented to person, place, and time. She appears well-developed and well-nourished.  HENT:  Head: Normocephalic.  Right Ear: External ear normal.  Left Ear: External ear normal.  Mouth/Throat: Oropharynx is clear and moist.  Eyes: EOM are normal.  Neck: Normal range of motion. Neck supple.  Cardiovascular: Normal rate.  Respiratory: Effort normal.  GI: Soft.  Musculoskeletal: Normal range of motion.  Neurological: She is alert and oriented to person, place, and time.  Skin: Skin is warm and dry.  Psychiatric: She has a normal mood and affect. Her behavior is normal. Judgment and thought content normal.   Left ear: Visible with otoscope is shiny brown matter consistent with foreign body vs.blood clot.  ED Course  Assessment: Left ear pain Headache Hypertension  Plan: We discussed that since it is 0200 a.m. she can see a Family practice provider this morning (Monday) in a few hours who can diagnose her ear problem. I consulted with Dr. Charlotta Newton the plan is 1. Pain med for ear pain 2. See Family doctor this morning for ear 3. PIH labs for baseline (140's/90s here)  4. Discharge to home     Jonetta Speak CNM, MSN 06/20/2018 2:31 AM

## 2018-06-22 ENCOUNTER — Ambulatory Visit (HOSPITAL_COMMUNITY)
Admission: RE | Admit: 2018-06-22 | Discharge: 2018-06-22 | Disposition: A | Discharge status: Home / Self Care | Payer: 59 | Source: Ambulatory Visit | Attending: Obstetrics & Gynecology | Admitting: Obstetrics & Gynecology

## 2018-06-22 ENCOUNTER — Other Ambulatory Visit: Payer: Self-pay

## 2018-06-22 DIAGNOSIS — O10013 Pre-existing essential hypertension complicating pregnancy, third trimester: Secondary | ICD-10-CM | POA: Diagnosis not present

## 2018-06-22 DIAGNOSIS — Z363 Encounter for antenatal screening for malformations: Secondary | ICD-10-CM | POA: Diagnosis not present

## 2018-06-22 DIAGNOSIS — Z3A19 19 weeks gestation of pregnancy: Secondary | ICD-10-CM

## 2018-06-22 DIAGNOSIS — O10012 Pre-existing essential hypertension complicating pregnancy, second trimester: Secondary | ICD-10-CM | POA: Diagnosis not present

## 2018-06-22 DIAGNOSIS — Z3689 Encounter for other specified antenatal screening: Secondary | ICD-10-CM

## 2018-06-22 HISTORY — DX: Gastric ulcer, unspecified as acute or chronic, without hemorrhage or perforation: K25.9

## 2018-06-22 HISTORY — DX: Gastro-esophageal reflux disease without esophagitis: K21.9

## 2018-06-23 ENCOUNTER — Other Ambulatory Visit (HOSPITAL_COMMUNITY): Payer: Self-pay | Admitting: *Deleted

## 2018-06-23 DIAGNOSIS — O26879 Cervical shortening, unspecified trimester: Secondary | ICD-10-CM

## 2018-06-23 DIAGNOSIS — O10919 Unspecified pre-existing hypertension complicating pregnancy, unspecified trimester: Secondary | ICD-10-CM

## 2018-07-06 NOTE — L&D Delivery Note (Signed)
Delivery Note At 07:12pm, I was called by Erskine Squibb RN that patient was feeling increased pressure. I hastily made my way to the room as RN checked patient and stated she was complete. At 7:15 PM a viable female delivered spontaneously in bed via Vaginal, Spontaneous (Presentation: vertex).  APGAR: 9, 9; weight 4lbs12oz. I arrived to room directly after birth and delivered the placenta.  Placenta status: delivered spontaneously and completely.  Cord: 3 vessel with the following complications:   Anesthesia:  n/a Episiotomy: None Lacerations: None Suture Repair: n/a Est. Blood Loss (mL): 196  Mom to postpartum.  Baby to Couplet care / Skin to Skin vs NICU due to prematurity and decreased fetal weight.  Janeece Riggers 10/20/2018, 7:38 PM

## 2018-07-07 ENCOUNTER — Ambulatory Visit (HOSPITAL_COMMUNITY)
Admission: RE | Admit: 2018-07-07 | Discharge: 2018-07-07 | Disposition: A | Discharge status: Home / Self Care | Payer: 59 | Source: Ambulatory Visit | Attending: Obstetrics & Gynecology | Admitting: Obstetrics & Gynecology

## 2018-07-07 DIAGNOSIS — O10012 Pre-existing essential hypertension complicating pregnancy, second trimester: Secondary | ICD-10-CM

## 2018-07-07 DIAGNOSIS — Z3686 Encounter for antenatal screening for cervical length: Secondary | ICD-10-CM | POA: Diagnosis not present

## 2018-07-07 DIAGNOSIS — O26879 Cervical shortening, unspecified trimester: Secondary | ICD-10-CM | POA: Insufficient documentation

## 2018-07-07 DIAGNOSIS — Z3A21 21 weeks gestation of pregnancy: Secondary | ICD-10-CM | POA: Diagnosis not present

## 2018-07-20 ENCOUNTER — Ambulatory Visit (HOSPITAL_COMMUNITY): Payer: 59

## 2018-07-27 ENCOUNTER — Encounter (HOSPITAL_COMMUNITY): Payer: Self-pay | Admitting: *Deleted

## 2018-07-27 ENCOUNTER — Inpatient Hospital Stay (HOSPITAL_COMMUNITY)
Admission: AD | Admit: 2018-07-27 | Discharge: 2018-07-27 | Disposition: A | Discharge status: Home / Self Care | Payer: 59 | Source: Ambulatory Visit | Attending: Obstetrics and Gynecology | Admitting: Obstetrics and Gynecology

## 2018-07-27 DIAGNOSIS — G43009 Migraine without aura, not intractable, without status migrainosus: Secondary | ICD-10-CM | POA: Diagnosis not present

## 2018-07-27 DIAGNOSIS — Z3A24 24 weeks gestation of pregnancy: Secondary | ICD-10-CM | POA: Insufficient documentation

## 2018-07-27 DIAGNOSIS — O10919 Unspecified pre-existing hypertension complicating pregnancy, unspecified trimester: Secondary | ICD-10-CM

## 2018-07-27 DIAGNOSIS — G43001 Migraine without aura, not intractable, with status migrainosus: Secondary | ICD-10-CM | POA: Diagnosis not present

## 2018-07-27 DIAGNOSIS — O10012 Pre-existing essential hypertension complicating pregnancy, second trimester: Secondary | ICD-10-CM | POA: Diagnosis not present

## 2018-07-27 DIAGNOSIS — R51 Headache: Secondary | ICD-10-CM | POA: Diagnosis present

## 2018-07-27 LAB — COMPREHENSIVE METABOLIC PANEL
ALT: 33 U/L (ref 0–44)
ANION GAP: 11 (ref 5–15)
AST: 29 U/L (ref 15–41)
Albumin: 3.7 g/dL (ref 3.5–5.0)
Alkaline Phosphatase: 78 U/L (ref 38–126)
BUN: 7 mg/dL (ref 6–20)
CALCIUM: 8.8 mg/dL — AB (ref 8.9–10.3)
CO2: 21 mmol/L — AB (ref 22–32)
Chloride: 101 mmol/L (ref 98–111)
Creatinine, Ser: 0.55 mg/dL (ref 0.44–1.00)
GFR calc Af Amer: >60 mL/min (ref >60)
Glucose, Bld: 75 mg/dL (ref 70–99)
POTASSIUM: 3.3 mmol/L — AB (ref 3.5–5.1)
SODIUM: 133 mmol/L — AB (ref 135–145)
Total Bilirubin: 0.8 mg/dL (ref 0.3–1.2)
Total Protein: 8.2 g/dL — ABNORMAL HIGH (ref 6.5–8.1)

## 2018-07-27 LAB — CBC WITH DIFFERENTIAL/PLATELET
BASOS ABS: 0 10*3/uL (ref 0.0–0.1)
Basophils Relative: 0 %
EOS ABS: 0.3 10*3/uL (ref 0.0–0.5)
EOS PCT: 2 %
HCT: 35.7 % — ABNORMAL LOW (ref 36.0–46.0)
Hemoglobin: 11.7 g/dL — ABNORMAL LOW (ref 12.0–15.0)
LYMPHS ABS: 2.4 10*3/uL (ref 0.7–4.0)
Lymphocytes Relative: 17 %
MCH: 30.5 pg (ref 26.0–34.0)
MCHC: 32.8 g/dL (ref 30.0–36.0)
MCV: 93 fL (ref 80.0–100.0)
MONO ABS: 0.5 10*3/uL (ref 0.1–1.0)
Monocytes Relative: 3 %
Neutro Abs: 10.7 10*3/uL — ABNORMAL HIGH (ref 1.7–7.7)
Neutrophils Relative %: 78 %
PLATELETS: 421 10*3/uL — AB (ref 150–400)
RBC: 3.84 MIL/uL — ABNORMAL LOW (ref 3.87–5.11)
RDW: 13.1 % (ref 11.5–15.5)
WBC: 13.8 10*3/uL — ABNORMAL HIGH (ref 4.0–10.5)
nRBC: 0 % (ref 0.0–0.2)

## 2018-07-27 LAB — PROTEIN / CREATININE RATIO, URINE
Creatinine, Urine: 263 mg/dL
Protein Creatinine Ratio: 0.12 mg/mg{Cre} (ref 0.00–0.15)
TOTAL PROTEIN, URINE: 32 mg/dL

## 2018-07-27 LAB — URIC ACID: Uric Acid, Serum: 3.6 mg/dL (ref 2.5–7.1)

## 2018-07-27 LAB — LACTATE DEHYDROGENASE: LDH: 134 U/L (ref 98–192)

## 2018-07-27 MED ORDER — BUTALBITAL-APAP-CAFFEINE 50-325-40 MG PO TABS
1.0000 | ORAL_TABLET | Freq: Four times a day (QID) | ORAL | Status: DC | PRN
  Administered 2018-07-27: 1 via ORAL
  Filled 2018-07-27: qty 1

## 2018-07-27 MED ORDER — BUTALBITAL-APAP-CAFFEINE 50-325-40 MG PO TABS: 1.0000 | ORAL_TABLET | Freq: Four times a day (QID) | ORAL | 0 refills | Status: DC | PRN

## 2018-07-27 MED ORDER — ACETAMINOPHEN 325 MG PO TABS: 650.0000 mg | ORAL_TABLET | Freq: Four times a day (QID) | ORAL | Status: DC | PRN

## 2018-07-27 NOTE — [facility] Provider Note (Signed)
History     CSN: [id number]  Arrival date and time: 07/27/18 1103   First Provider Initiated Contact with Patient 07/27/18 1213      Chief Complaint  Patient presents with  . Headache  . Hypertension   Patient reports a headache that started yesterday evening at 5-6pm. It is a bilateral frontal headache across her forehead, she rates it 6/10. She has also experienced dizziness, blurred vision, and nausea and vomiting with the headache. The nausea and vomiting has since resolved and patient is able to PO hydrate. Bright lights make the headache worse. She tried Tylenol but this did not improve her headache. Patient reports a history of migraine headache but has not had a migraine in several years. She reports her headache feels like a migraine. She denies symptoms of upper respiratory infection or flu. She was seen for a sinus infection at [facility] [place] office 2 weeks ago and prescribe Z-pak. She denies epigastric pain.   OB History    Gravida  2   Para  1   Term  1   Preterm  0   AB  0   Living  1     SAB  0   TAB  0   Ectopic  0   Multiple  0   Live Births  1           Past Medical History:  Diagnosis Date  . Depression   . Frequent headaches   . GERD (gastroesophageal reflux disease)   . Hypertension   . Migraines   . Ulcer, stomach peptic   . UTI (lower urinary tract infection)     Past Surgical History:  Procedure Laterality Date  . NO PAST SURGERIES      Family History  Problem Relation Age of Onset  . Hypertension Mother   . Hyperlipidemia Mother   . Heart disease Mother   . Kidney disease Mother   . Diabetes Mother   . Hypertension Father   . Stroke Father   . Diabetes Father   . Hypertension Brother   . Breast cancer Maternal Aunt   . Lung cancer Maternal Uncle   . Prostate cancer Maternal Grandmother   . Prostate cancer Maternal Grandfather   . Alzheimer's disease Paternal Grandmother     Social History   Tobacco Use  . Smoking  status: Never Smoker  . Smokeless tobacco: Never Used  Substance Use Topics  . Alcohol use: No  . Drug use: No    Allergies: No Known Allergies  Medications Prior to Admission  Medication Sig Dispense Refill Last Dose  . acetaminophen (TYLENOL) 500 MG tablet Take 1,000 mg by mouth every 6 (six) hours as needed for headache.   More than a month at Unknown time  . omeprazole (PRILOSEC) 40 MG capsule Take 1 capsule (40 mg total) by mouth daily before breakfast. 90 capsule 0 More than a month at Unknown time  . ondansetron (ZOFRAN ODT) 8 MG disintegrating tablet Take 1 tablet (8 mg total) by mouth every 8 (eight) hours as needed for nausea or vomiting. 20 tablet 0 More than a month at Unknown time  . promethazine (PHENERGAN) 25 MG tablet Take 0.5-1 tablets (12.5-25 mg total) by mouth every 6 (six) hours as needed. 30 tablet 0 More than a month at Unknown time  . valACYclovir (VALTREX) 500 MG tablet valacyclovir 500 mg tablet  Take 1 tablet every day by oral route.   More than a month at Unknown time  Review of Systems  HENT: Positive for sinus pressure and sinus pain. Negative for congestion.   Eyes: Positive for photophobia and visual disturbance.  Gastrointestinal: Negative for abdominal pain.  Neurological: Positive for dizziness and headaches.       Dizziness is worse when moving   All other systems reviewed and are negative.  Physical Exam   Vitals:   07/27/18 1216 07/27/18 1231 07/27/18 1246 07/27/18 1300  BP: 122/86 (!) 139/96 (!) 144/97 (!) 145/92  Pulse: (!) 105 96 91 93  Resp:      Temp:      TempSrc:      SpO2:      Weight:      Height:        Physical Exam  Nursing note and vitals reviewed. Constitutional: She is oriented to person, place, and time. She appears well-developed and well-nourished.  HENT:  Head: Normocephalic and atraumatic.  Nose: Right sinus exhibits maxillary sinus tenderness and frontal sinus tenderness. Left sinus exhibits maxillary sinus  tenderness and frontal sinus tenderness.  Cardiovascular: Normal rate, regular rhythm and normal heart sounds.  Respiratory: Effort normal and breath sounds normal. No respiratory distress.  GI: Soft. There is no abdominal tenderness.  Musculoskeletal: Normal range of motion.  Neurological: She is alert and oriented to person, place, and time.  Skin: Skin is warm and dry.  Psychiatric: She has a normal mood and affect. Her behavior is normal. Judgment and thought content normal.   Results for orders placed or performed during the hospital encounter of 07/27/18 (from the past 24 hour(s))  CBC with Differential/Platelet     Status: Abnormal   Collection Time: 07/27/18 11:30 AM  Result Value Ref Range   WBC 13.8 (H) 4.0 - 10.5 K/uL   RBC 3.84 (L) 3.87 - 5.11 MIL/uL   Hemoglobin 11.7 (L) 12.0 - 15.0 g/dL   HCT 16.135.7 (L) 09.636.0 - 04.546.0 %   MCV 93.0 80.0 - 100.0 fL   MCH 30.5 26.0 - 34.0 pg   MCHC 32.8 30.0 - 36.0 g/dL   RDW 40.913.1 81.111.5 - 91.415.5 %   Platelets 421 (H) 150 - 400 K/uL   nRBC 0.0 0.0 - 0.2 %   Neutrophils Relative % 78 %   Neutro Abs 10.7 (H) 1.7 - 7.7 K/uL   Lymphocytes Relative 17 %   Lymphs Abs 2.4 0.7 - 4.0 K/uL   Monocytes Relative 3 %   Monocytes Absolute 0.5 0.1 - 1.0 K/uL   Eosinophils Relative 2 %   Eosinophils Absolute 0.3 0.0 - 0.5 K/uL   Basophils Relative 0 %   Basophils Absolute 0.0 0.0 - 0.1 K/uL  Comprehensive metabolic panel     Status: Abnormal   Collection Time: 07/27/18 11:30 AM  Result Value Ref Range   Sodium 133 (L) 135 - 145 mmol/L   Potassium 3.3 (L) 3.5 - 5.1 mmol/L   Chloride 101 98 - 111 mmol/L   CO2 21 (L) 22 - 32 mmol/L   Glucose, Bld 75 70 - 99 mg/dL   BUN 7 6 - 20 mg/dL   Creatinine, Ser 7.820.55 0.44 - 1.00 mg/dL   Calcium 8.8 (L) 8.9 - 10.3 mg/dL   Total Protein 8.2 (H) 6.5 - 8.1 g/dL   Albumin 3.7 3.5 - 5.0 g/dL   AST 29 15 - 41 U/L   ALT 33 0 - 44 U/L   Alkaline Phosphatase 78 38 - 126 U/L   Total Bilirubin 0.8 0.3 - 1.2 mg/dL  GFR  calc non Af Amer >60 >60 mL/min   GFR calc Af Amer >60 >60 mL/min   Anion gap 11 5 - 15  Uric acid     Status: None   Collection Time: 07/27/18 11:30 AM  Result Value Ref Range   Uric Acid, Serum 3.6 2.5 - 7.1 mg/dL  Lactate dehydrogenase     Status: None   Collection Time: 07/27/18 11:30 AM  Result Value Ref Range   LDH 134 98 - 192 U/L  Protein / creatinine ratio, urine     Status: None   Collection Time: 07/27/18 12:05 PM  Result Value Ref Range   Creatinine, Urine 263.00 mg/dL   Total Protein, Urine 32 mg/dL   Protein Creatinine Ratio 0.12 0.00 - 0.15 mg/mg[Cre]    MAU Course  Procedures NST PIH labs  MDM Preeclampsia labs resulted and are negative for preeclampsia. Patient's symptoms are consistent with migraine headache, she had history of migraines, and she feels symptoms are consistent with migraine. Diagnosis of migraine without aura is made. Patient felt headache improved with Fioricet (4/10 pain instead of 6/10 pain), patient fell asleep after Fioricet given. Consulted Dr. Normand Sloop regarding management of patient: Will discharge patient home to follow up with [facility] next week. Will discuss possible neurology referral at that visit if patient desires. Encouraged PO fluids and rest today, along with Fioricet 1-2 tabs q6hrs PRN Migraine. Preeclampsia precautions discussed and handout given. Handout also given on migraines.    Assessment and Plan  33 y.o. G2P1 at [redacted]w[redacted]d Migraine headache without aura Chronic hypertension on Procardia 30mg  XL QD and 81mg  Aspirin QD Reactive NST Discharge home with work excuse for today Fioricet prescription sent 1-2 tabs q6hrs PRN Migraine Encouraged PO hydration Patient to follow up next week with [facility] office Preeclampsia precautions discussed and preeclampsia/hypertension/migraine handouts given   Meredith Velez 07/27/2018, 1:11 PM

## 2018-07-27 NOTE — Discharge Instructions (Signed)
Migraine Headache A migraine headache is an intense, throbbing pain on one side or both sides of the head. Migraines may also cause other symptoms, such as nausea, vomiting, and sensitivity to light and noise. What are the causes? Doing or taking certain things may also trigger migraines, such as:  Alcohol.  Smoking.  Medicines, such as: ? Medicine used to treat chest pain (nitroglycerine). ? Birth control pills. ? Estrogen pills. ? Certain blood pressure medicines.  Aged cheeses, chocolate, or caffeine.  Foods or drinks that contain nitrates, glutamate, aspartame, or tyramine.  Physical activity. Other things that may trigger a migraine include:  Menstruation.  Pregnancy.  Hunger.  Stress, lack of sleep, too much sleep, or fatigue.  Weather changes. What increases the risk? The following factors may make you more likely to experience migraine headaches:  Age. Risk increases with age.  Family history of migraine headaches.  Being Caucasian.  Depression and anxiety.  Obesity.  Being a woman.  Having a hole in the heart (patent foramen ovale) or other heart problems. What are the signs or symptoms? The main symptom of this condition is pulsating or throbbing pain. Pain may:  Happen in any area of the head, such as on one side or both sides.  Interfere with daily activities.  Get worse with physical activity.  Get worse with exposure to bright lights or loud noises. Other symptoms may include:  Nausea.  Vomiting.  Dizziness.  General sensitivity to bright lights, loud noises, or smells. Before you get a migraine, you may get warning signs that a migraine is developing (aura). An aura may include:  Seeing flashing lights or having blind spots.  Seeing bright spots, halos, or zigzag lines.  Having tunnel vision or blurred vision.  Having numbness or a tingling feeling.  Having trouble talking.  Having muscle weakness. How is this diagnosed? A  migraine headache can be diagnosed based on:  Your symptoms.  A physical exam.  Tests, such as CT scan or MRI of the head. These imaging tests can help rule out other causes of headaches.  Taking fluid from the spine (lumbar puncture) and analyzing it (cerebrospinal fluid analysis, or CSF analysis). How is this treated? A migraine headache is usually treated with medicines that:  Relieve pain.  Relieve nausea.  Prevent migraines from coming back. Treatment may also include:  Acupuncture.  Lifestyle changes like avoiding foods that trigger migraines. Follow these instructions at home: Medicines  Take over-the-counter and prescription medicines only as told by your health care provider.  Do not drive or use heavy machinery while taking prescription pain medicine.  To prevent or treat constipation while you are taking prescription pain medicine, your health care provider may recommend that you: ? Drink enough fluid to keep your urine clear or pale yellow. ? Take over-the-counter or prescription medicines. ? Eat foods that are high in fiber, such as fresh fruits and vegetables, whole grains, and beans. ? Limit foods that are high in fat and processed sugars, such as fried and sweet foods. Lifestyle  Avoid alcohol use.  Do not use any products that contain nicotine or tobacco, such as cigarettes and e-cigarettes. If you need help quitting, ask your health care provider.  Get at least 8 hours of sleep every night.  Limit your stress. General instructions      Keep a journal to find out what may trigger your migraine headaches. For example, write down: ? What you eat and drink. ? How much sleep you  get. ? Any change to your diet or medicines.  If you have a migraine: ? Avoid things that make your symptoms worse, such as bright lights. ? It may help to lie down in a dark, quiet room. ? Do not drive or use heavy machinery. ? Ask your health care provider what  activities are safe for you while you are experiencing symptoms.  Keep all follow-up visits as told by your health care provider. This is important. Contact a health care provider if:  You develop symptoms that are different or more severe than your usual migraine symptoms. Get help right away if:  Your migraine becomes severe.  You have a fever.  You have a stiff neck.  You have vision loss.  Your muscles feel weak or like you cannot control them.  You start to lose your balance often.  You develop trouble walking.  You faint. This information is not intended to replace advice given to you by your health care provider. Make sure you discuss any questions you have with your health care provider. Document Released: 06/22/2005 Document Revised: 01/10/2016 Document Reviewed: 12/09/2015 Elsevier Interactive Patient Education  2019 [employer]   Hypertension During Pregnancy  Hypertension, commonly called high blood pressure, is when the force of blood pumping through your arteries is too strong. Arteries are blood vessels that carry blood from the heart throughout the body. Hypertension during pregnancy can cause problems for you and your baby. Your baby may be born early (prematurely) or may not weigh as much as he or she should at birth. Very bad cases of hypertension during pregnancy can be life-threatening. Different types of hypertension can occur during pregnancy. These include:  Chronic hypertension. This happens when: ? You have hypertension before pregnancy and it continues during pregnancy. ? You develop hypertension before you are [redacted] weeks pregnant, and it continues during pregnancy.  Gestational hypertension. This is hypertension that develops after the 20th week of pregnancy.  Preeclampsia, also called toxemia of pregnancy. This is a very serious type of hypertension that develops during pregnancy. It can be very dangerous for you and your baby. ? In rare cases,  you may develop preeclampsia after giving birth (postpartum preeclampsia). This usually occurs within 48 hours after childbirth but may occur up to 6 weeks after giving birth. Gestational hypertension and preeclampsia usually go away within 6 weeks after your baby is born. Women who have hypertension during pregnancy have a greater chance of developing hypertension later in life or during future pregnancies. What are the causes? The exact cause of hypertension during pregnancy is not known. What increases the risk? There are certain factors that make it more likely for you to develop hypertension during pregnancy. These include:  Having hypertension during a previous pregnancy or prior to pregnancy.  Being overweight.  Being age 35 or older.  Being pregnant for the first time.  Being pregnant with more than one baby.  Becoming pregnant using fertilization methods such as IVF (in vitro fertilization).  Having diabetes, kidney problems, or systemic lupus erythematosus.  Having a family history of hypertension. What are the signs or symptoms? Chronic hypertension and gestational hypertension rarely cause symptoms. Preeclampsia causes symptoms, which may include:  Increased protein in your urine. Your health care provider will check for this at every visit before you give birth (prenatal visit).  Severe headaches.  Sudden weight gain.  Swelling of the hands, face, legs, and feet.  Nausea and vomiting.  Vision problems, such as blurred or  double vision.  Numbness in the face, arms, legs, and feet.  Dizziness.  Slurred speech.  Sensitivity to bright lights.  Abdominal pain.  Convulsions or seizures. How is this diagnosed? You may be diagnosed with hypertension during a routine prenatal exam. At each prenatal visit, you may:  Have a urine test to check for high amounts of protein in your urine.  Have your blood pressure checked. A blood pressure reading is given as two  numbers, such as "120 over 80" (or 120/80). The first ("top") number is a measure of the pressure in your arteries when your heart beats (systolic pressure). The second ("bottom") number is a measure of the pressure in your arteries as your heart relaxes between beats (diastolic pressure). Blood pressure is measured in a unit called mm Hg. For most women, a normal blood pressure reading is: ? Systolic: below 120. ? Diastolic: below 80. The type of hypertension that you are diagnosed with depends on your test results and when your symptoms developed.  Chronic hypertension is usually diagnosed before 20 weeks of pregnancy.  Gestational hypertension is usually diagnosed after 20 weeks of pregnancy.  Hypertension with high amounts of protein in the urine is diagnosed as preeclampsia.  Blood pressure measurements that stay above 160 systolic, or above 110 diastolic, are signs of severe preeclampsia. How is this treated? Treatment for hypertension during pregnancy varies depending on the type of hypertension you have and how serious it is.  If you take medicines called ACE inhibitors to treat chronic hypertension, you may need to switch medicines. ACE inhibitors should not be taken during pregnancy.  If you have gestational hypertension, you may need to take blood pressure medicine.  If you are at risk for preeclampsia, your health care provider may recommend that you take a low-dose aspirin during your pregnancy.  If you have severe preeclampsia, you may need to be hospitalized so you and your baby can be monitored closely. You may also need to take medicine (magnesium sulfate) to prevent seizures and to lower blood pressure. This medicine may be given as an injection or through an IV.  In some cases, if your condition gets worse, you may need to deliver your baby early. Follow these instructions at home: Eating and drinking   Drink enough fluid to keep your urine pale yellow.  Avoid  caffeine. Lifestyle  Do not use any products that contain nicotine or tobacco, such as cigarettes and e-cigarettes. If you need help quitting, ask your health care provider.  Do not use alcohol or drugs.  Avoid stress as much as possible. Rest and get plenty of sleep. General instructions  Take over-the-counter and prescription medicines only as told by your health care provider.  While lying down, lie on your left side. This keeps pressure off your major blood vessels.  While sitting or lying down, raise (elevate) your feet. Try putting some pillows under your lower legs.  Exercise regularly. Ask your health care provider what kinds of exercise are best for you.  Keep all prenatal and follow-up visits as told by your health care provider. This is important. Contact a health care provider if:  You have symptoms that your health care provider told you may require more treatment or monitoring, such as: ? Nausea or vomiting. ? Headache. Get help right away if you have:  Severe abdominal pain that does not get better with treatment.  A severe headache that does not get better.  Vomiting that does not get better.  Sudden, rapid weight gain.  Sudden swelling in your hands, ankles, or face.  Vaginal bleeding.  Blood in your urine.  Fewer movements from your baby than usual.  Blurred or double vision.  Muscle twitching or sudden muscle tightening (spasms).  Shortness of breath.  Blue fingernails or lips. Summary  Hypertension, commonly called high blood pressure, is when the force of blood pumping through your arteries is too strong.  Hypertension during pregnancy can cause problems for you and your baby.  Treatment for hypertension during pregnancy varies depending on the type of hypertension you have and how serious it is.  Get help right away if you have symptoms that your health care provider told you to watch for. This information is not intended to replace  advice given to you by your health care provider. Make sure you discuss any questions you have with your health care provider. Document Released: 03/10/2011 Document Revised: 06/08/2017 Document Reviewed: 12/06/2015 Elsevier Interactive Patient Education  2019 [employer]   Preeclampsia and Eclampsia  Preeclampsia is a serious condition that may develop during pregnancy. It is also called toxemia of pregnancy. This condition causes high blood pressure along with other symptoms, such as swelling and headaches. These symptoms may develop as the condition gets worse. Preeclampsia may occur at 20 weeks of pregnancy or later. Diagnosing and treating preeclampsia early is very important. If not treated early, it can cause serious problems for you and your baby. One problem it can lead to is eclampsia. Eclampsia is a condition that causes muscle jerking or shaking (convulsions or seizures) and other serious problems for the mother. During pregnancy, delivering your baby may be the best treatment for preeclampsia or eclampsia. For most women, preeclampsia and eclampsia symptoms go away after giving birth. In rare cases, a woman may develop preeclampsia after giving birth (postpartum preeclampsia). This usually occurs within 48 hours after childbirth but may occur up to 6 weeks after giving birth. What are the causes? The cause of preeclampsia is not known. What increases the risk? The following risk factors make you more likely to develop preeclampsia:  Being pregnant for the first time.  Having had preeclampsia during a past pregnancy.  Having a family history of preeclampsia.  Having high blood pressure.  Being pregnant with more than one baby.  Being 83 or older.  Being African-American.  Having kidney disease or diabetes.  Having medical conditions such as lupus or blood diseases.  Being very overweight (obese). What are the signs or symptoms? The earliest signs of preeclampsia  are:  High blood pressure.  Increased protein in your urine. Your health care provider will check for this at every visit before you give birth (prenatal visit). Other symptoms that may develop as the condition gets worse include:  Severe headaches.  Sudden weight gain.  Swelling of the hands, face, legs, and feet.  Nausea and vomiting.  Vision problems, such as blurred or double vision.  Numbness in the face, arms, legs, and feet.  Urinating less than usual.  Dizziness.  Slurred speech.  Abdominal pain, especially upper abdominal pain.  Convulsions or seizures. How is this diagnosed? There are no screening tests for preeclampsia. Your health care provider will ask you about symptoms and check for signs of preeclampsia during your prenatal visits. You may also have tests that include:  Urine tests.  Blood tests.  Checking your blood pressure.  Monitoring your babys heart rate.  Ultrasound. How is this treated? You and your health care provider  will determine the treatment approach that is best for you. Treatment may include:  Having more frequent prenatal exams to check for signs of preeclampsia, if you have an increased risk for preeclampsia.  Medicine to lower your blood pressure.  Staying in the hospital, if your condition is severe. There, treatment will focus on controlling your blood pressure and the amount of fluids in your body (fluid retention).  Taking medicine (magnesium sulfate) to prevent seizures. This may be given as an injection or through an IV.  Taking a low-dose aspirin during your pregnancy.  Delivering your baby early, if your condition gets worse. You may have your labor started with medicine (induced), or you may have a cesarean delivery. Follow these instructions at home: Eating and drinking   Drink enough fluid to keep your urine pale yellow.  Avoid caffeine. Lifestyle  Do not use any products that contain nicotine or tobacco,  such as cigarettes and e-cigarettes. If you need help quitting, ask your health care provider.  Do not use alcohol or drugs.  Avoid stress as much as possible. Rest and get plenty of sleep. General instructions  Take over-the-counter and prescription medicines only as told by your health care provider.  When lying down, lie on your left side. This keeps pressure off your major blood vessels.  When sitting or lying down, raise (elevate) your feet. Try putting some pillows underneath your lower legs.  Exercise regularly. Ask your health care provider what kinds of exercise are best for you.  Keep all follow-up and prenatal visits as told by your health care provider. This is important. How is this prevented? There is no known way of preventing preeclampsia or eclampsia from developing. However, to lower your risk of complications and detect problems early:  Get regular prenatal care. Your health care provider may be able to diagnose and treat the condition early.  Maintain a healthy weight. Ask your health care provider for help managing weight gain during pregnancy.  Work with your health care provider to manage any long-term (chronic) health conditions you have, such as diabetes or kidney problems.  You may have tests of your blood pressure and kidney function after giving birth.  Your health care provider may have you take low-dose aspirin during your next pregnancy. Contact a health care provider if:  You have symptoms that your health care provider told you may require more treatment or monitoring, such as: ? Headaches. ? Nausea or vomiting. ? Abdominal pain. ? Dizziness. ? Light-headedness. Get help right away if:  You have severe: ? Abdominal pain. ? Headaches that do not get better. ? Dizziness. ? Vision problems. ? Confusion. ? Nausea or vomiting.  You have any of the following: ? A seizure. ? Sudden, rapid weight gain. ? Sudden swelling in your hands, ankles,  or face. ? Trouble moving any part of your body. ? Numbness in any part of your body. ? Trouble speaking. ? Abnormal bleeding.  You faint. Summary  Preeclampsia is a serious condition that may develop during pregnancy. It is also called toxemia of pregnancy.  This condition causes high blood pressure along with other symptoms, such as swelling and headaches.  Diagnosing and treating preeclampsia early is very important. If not treated early, it can cause serious problems for you and your baby.  Get help right away if you have symptoms that your health care provider told you to watch for. This information is not intended to replace advice given to you by your health  care provider. Make sure you discuss any questions you have with your health care provider. Document Released: 06/19/2000 Document Revised: 06/08/2017 Document Reviewed: 01/27/2016 Elsevier Interactive Patient Education  2019 [employer].

## 2018-07-27 NOTE — MAU Note (Signed)
Pt sent from MD office , was there today due to headache x 3 days, blurred vision, nausea/vomited x one today

## 2018-08-03 DIAGNOSIS — O139 Gestational [pregnancy-induced] hypertension without significant proteinuria, unspecified trimester: Secondary | ICD-10-CM | POA: Diagnosis not present

## 2018-08-06 DIAGNOSIS — O99019 Anemia complicating pregnancy, unspecified trimester: Secondary | ICD-10-CM | POA: Insufficient documentation

## 2018-08-09 DIAGNOSIS — Z369 Encounter for antenatal screening, unspecified: Secondary | ICD-10-CM | POA: Diagnosis not present

## 2018-08-09 DIAGNOSIS — Z3A26 26 weeks gestation of pregnancy: Secondary | ICD-10-CM | POA: Diagnosis not present

## 2018-08-09 DIAGNOSIS — I10 Essential (primary) hypertension: Secondary | ICD-10-CM | POA: Diagnosis not present

## 2018-08-24 ENCOUNTER — Other Ambulatory Visit: Payer: Self-pay

## 2018-08-24 ENCOUNTER — Encounter (HOSPITAL_COMMUNITY): Payer: Self-pay

## 2018-08-24 ENCOUNTER — Inpatient Hospital Stay (HOSPITAL_COMMUNITY)
Admission: AD | Admit: 2018-08-24 | Discharge: 2018-08-24 | Disposition: A | Discharge status: Home / Self Care | Payer: 59 | Attending: Obstetrics & Gynecology | Admitting: Obstetrics & Gynecology

## 2018-08-24 DIAGNOSIS — Z3A28 28 weeks gestation of pregnancy: Secondary | ICD-10-CM | POA: Insufficient documentation

## 2018-08-24 DIAGNOSIS — O10013 Pre-existing essential hypertension complicating pregnancy, third trimester: Secondary | ICD-10-CM | POA: Insufficient documentation

## 2018-08-24 DIAGNOSIS — R51 Headache: Secondary | ICD-10-CM | POA: Insufficient documentation

## 2018-08-24 DIAGNOSIS — I10 Essential (primary) hypertension: Secondary | ICD-10-CM

## 2018-08-24 DIAGNOSIS — O133 Gestational [pregnancy-induced] hypertension without significant proteinuria, third trimester: Secondary | ICD-10-CM | POA: Diagnosis not present

## 2018-08-24 LAB — URINALYSIS, ROUTINE W REFLEX MICROSCOPIC
Bilirubin Urine: NEGATIVE
GLUCOSE, UA: NEGATIVE mg/dL
HGB URINE DIPSTICK: NEGATIVE
KETONES UR: NEGATIVE mg/dL
Nitrite: NEGATIVE
PROTEIN: NEGATIVE mg/dL
Specific Gravity, Urine: 1.01 (ref 1.005–1.030)
pH: 7 (ref 5.0–8.0)

## 2018-08-24 LAB — COMPREHENSIVE METABOLIC PANEL
ALBUMIN: 3.3 g/dL — AB (ref 3.5–5.0)
ALT: 17 U/L (ref 0–44)
AST: 19 U/L (ref 15–41)
Alkaline Phosphatase: 80 U/L (ref 38–126)
Anion gap: 10 (ref 5–15)
BUN: 7 mg/dL (ref 6–20)
CO2: 21 mmol/L — ABNORMAL LOW (ref 22–32)
Calcium: 8.7 mg/dL — ABNORMAL LOW (ref 8.9–10.3)
Chloride: 102 mmol/L (ref 98–111)
Creatinine, Ser: 0.49 mg/dL (ref 0.44–1.00)
GFR calc Af Amer: >60 mL/min (ref >60)
GFR calc non Af Amer: >60 mL/min (ref >60)
Glucose, Bld: 81 mg/dL (ref 70–99)
Potassium: 3.6 mmol/L (ref 3.5–5.1)
SODIUM: 133 mmol/L — AB (ref 135–145)
Total Bilirubin: 0.7 mg/dL (ref 0.3–1.2)
Total Protein: 7.8 g/dL (ref 6.5–8.1)

## 2018-08-24 LAB — CBC WITH DIFFERENTIAL/PLATELET
Basophils Absolute: 0 10*3/uL (ref 0.0–0.1)
Basophils Relative: 0 %
EOS ABS: 0.3 10*3/uL (ref 0.0–0.5)
Eosinophils Relative: 2 %
HCT: 32.3 % — ABNORMAL LOW (ref 36.0–46.0)
Hemoglobin: 10.4 g/dL — ABNORMAL LOW (ref 12.0–15.0)
Lymphocytes Relative: 19 %
Lymphs Abs: 2.5 10*3/uL (ref 0.7–4.0)
MCH: 29.4 pg (ref 26.0–34.0)
MCHC: 32.2 g/dL (ref 30.0–36.0)
MCV: 91.2 fL (ref 80.0–100.0)
Monocytes Absolute: 0.5 10*3/uL (ref 0.1–1.0)
Monocytes Relative: 4 %
Neutro Abs: 9.7 10*3/uL — ABNORMAL HIGH (ref 1.7–7.7)
Neutrophils Relative %: 75 %
Platelets: 373 10*3/uL (ref 150–400)
RBC: 3.54 MIL/uL — AB (ref 3.87–5.11)
RDW: 13.2 % (ref 11.5–15.5)
WBC: 13 10*3/uL — AB (ref 4.0–10.5)
nRBC: 0 % (ref 0.0–0.2)

## 2018-08-24 LAB — URINALYSIS, MICROSCOPIC (REFLEX)

## 2018-08-24 LAB — URIC ACID: Uric Acid, Serum: 3.1 mg/dL (ref 2.5–7.1)

## 2018-08-24 LAB — PROTEIN / CREATININE RATIO, URINE
Creatinine, Urine: 42 mg/dL
Protein Creatinine Ratio: 0.26 mg/mg{Cre} — ABNORMAL HIGH (ref 0.00–0.15)
Total Protein, Urine: 11 mg/dL

## 2018-08-24 LAB — LACTATE DEHYDROGENASE: LDH: 149 U/L (ref 98–192)

## 2018-08-24 MED ORDER — BUTALBITAL-APAP-CAFFEINE 50-325-40 MG PO TABS
2.0000 | ORAL_TABLET | Freq: Four times a day (QID) | ORAL | Status: DC | PRN
  Administered 2018-08-24: 2 via ORAL
  Filled 2018-08-24: qty 2

## 2018-08-24 MED ORDER — LABETALOL HCL 5 MG/ML IV SOLN: 20.0000 mg | INTRAVENOUS | Status: DC | PRN

## 2018-08-24 MED ORDER — HYDRALAZINE HCL 20 MG/ML IJ SOLN: 10.0000 mg | INTRAMUSCULAR | Status: DC | PRN

## 2018-08-24 MED ORDER — LABETALOL HCL 5 MG/ML IV SOLN: 40.0000 mg | INTRAVENOUS | Status: DC | PRN

## 2018-08-24 MED ORDER — LABETALOL HCL 5 MG/ML IV SOLN: 80.0000 mg | INTRAVENOUS | Status: DC | PRN

## 2018-08-24 NOTE — MAU Note (Signed)
Provider made aware of second severe range pressure, was told to hold off on labetalol until next pressure taken to see if severe.

## 2018-08-24 NOTE — MAU Provider Note (Signed)
History     CSN: [id number]  Arrival date and time: 08/24/18 1042   None     Chief Complaint  Patient presents with  . Hypertension  . Headache   Patient reported a 7/10 headache that started at 0300 this morning, fioricet did not resolve headache. She checked her blood pressure this morning 3x and it was severe range each time. She reports forgetting to take her Procardia Friday, Saturday, Sunday, and Monday night but did take it this morning at 0400. She reports a history of migraine but states this does not feel like a migraine to her because she is not having nausea and vomiting which is typical for her with a migraine. She is experiencing photophobia. She denies vision changes or RUQ pain. She reports good fetal movement.    OB History    Gravida  2   Para  1   Term  1   Preterm  0   AB  0   Living  1     SAB  0   TAB  0   Ectopic  0   Multiple  0   Live Births  1           Past Medical History:  Diagnosis Date  . Depression   . Frequent headaches   . GERD (gastroesophageal reflux disease)   . Hypertension   . Migraines   . Ulcer, stomach peptic   . UTI (lower urinary tract infection)     Past Surgical History:  Procedure Laterality Date  . NO PAST SURGERIES      Family History  Problem Relation Age of Onset  . Hypertension Mother   . Hyperlipidemia Mother   . Heart disease Mother   . Kidney disease Mother   . Diabetes Mother   . Hypertension Father   . Stroke Father   . Diabetes Father   . Hypertension Brother   . Breast cancer Maternal Aunt   . Lung cancer Maternal Uncle   . Prostate cancer Maternal Grandmother   . Prostate cancer Maternal Grandfather   . Alzheimer's disease Paternal Grandmother     Social History   Tobacco Use  . Smoking status: Never Smoker  . Smokeless tobacco: Never Used  Substance Use Topics  . Alcohol use: No  . Drug use: No    Allergies: No Known Allergies  Medications Prior to Admission   Medication Sig Dispense Refill Last Dose  . acetaminophen (TYLENOL) 500 MG tablet Take 1,000 mg by mouth every 6 (six) hours as needed for headache.   More than a month at Unknown time  . butalbital-acetaminophen-caffeine (FIORICET, ESGIC) 50-325-40 MG tablet Take 1-2 tablets by mouth every 6 (six) hours as needed for migraine. Not to exceed 4,000mg  Acetaminophen in 24 hours 30 tablet 0   . NIFEdipine (PROCARDIA-XL/NIFEDICAL-XL) 30 MG 24 hr tablet Take 30 mg by mouth daily at 12 noon.     Marland Kitchen omeprazole (PRILOSEC) 40 MG capsule Take 1 capsule (40 mg total) by mouth daily before breakfast. 90 capsule 0 More than a month at Unknown time  . ondansetron (ZOFRAN ODT) 8 MG disintegrating tablet Take 1 tablet (8 mg total) by mouth every 8 (eight) hours as needed for nausea or vomiting. 20 tablet 0 More than a month at Unknown time  . promethazine (PHENERGAN) 25 MG tablet Take 0.5-1 tablets (12.5-25 mg total) by mouth every 6 (six) hours as needed. 30 tablet 0 More than a month at Unknown time  .  valACYclovir (VALTREX) 500 MG tablet valacyclovir 500 mg tablet  Take 1 tablet every day by oral route.   More than a month at Unknown time    Review of Systems  Eyes: Positive for photophobia. Negative for visual disturbance.  Gastrointestinal: Negative for abdominal pain.  Neurological: Positive for headaches.  All other systems reviewed and are negative.  Physical Exam   Vitals:   08/24/18 1216 08/24/18 1231 08/24/18 1246 08/24/18 1316  BP: (!) 152/97 (!) 153/96 (!) 150/90 (!) 146/97  Pulse: (!) 105 (!) 104 (!) 106 (!) 109  Resp:      Temp:      TempSrc:      SpO2:      Weight:       Results for orders placed or performed during the hospital encounter of 08/24/18 (from the past 24 hour(s))  Protein / creatinine ratio, urine     Status: Abnormal   Collection Time: 08/24/18 11:13 AM  Result Value Ref Range   Creatinine, Urine 42.00 mg/dL   Total Protein, Urine 11 mg/dL   Protein Creatinine Ratio  0.26 (H) 0.00 - 0.15 mg/mg[Cre]  Urinalysis, Routine w reflex microscopic     Status: Abnormal   Collection Time: 08/24/18 11:34 AM  Result Value Ref Range   Color, Urine YELLOW YELLOW   APPearance HAZY (A) CLEAR   Specific Gravity, Urine 1.010 1.005 - 1.030   pH 7.0 5.0 - 8.0   Glucose, UA NEGATIVE NEGATIVE mg/dL   Hgb urine dipstick NEGATIVE NEGATIVE   Bilirubin Urine NEGATIVE NEGATIVE   Ketones, ur NEGATIVE NEGATIVE mg/dL   Protein, ur NEGATIVE NEGATIVE mg/dL   Nitrite NEGATIVE NEGATIVE   Leukocytes,Ua TRACE (A) NEGATIVE  Urinalysis, Microscopic (reflex)     Status: Abnormal   Collection Time: 08/24/18 11:34 AM  Result Value Ref Range   RBC / HPF 0-5 0 - 5 RBC/hpf   WBC, UA 0-5 0 - 5 WBC/hpf   Bacteria, UA FEW (A) NONE SEEN   Squamous Epithelial / LPF 0-5 0 - 5  CBC with Differential/Platelet     Status: Abnormal   Collection Time: 08/24/18 11:45 AM  Result Value Ref Range   WBC 13.0 (H) 4.0 - 10.5 K/uL   RBC 3.54 (L) 3.87 - 5.11 MIL/uL   Hemoglobin 10.4 (L) 12.0 - 15.0 g/dL   HCT 01.6 (L) 55.3 - 74.8 %   MCV 91.2 80.0 - 100.0 fL   MCH 29.4 26.0 - 34.0 pg   MCHC 32.2 30.0 - 36.0 g/dL   RDW 27.0 78.6 - 75.4 %   Platelets 373 150 - 400 K/uL   nRBC 0.0 0.0 - 0.2 %   Neutrophils Relative % 75 %   Neutro Abs 9.7 (H) 1.7 - 7.7 K/uL   Lymphocytes Relative 19 %   Lymphs Abs 2.5 0.7 - 4.0 K/uL   Monocytes Relative 4 %   Monocytes Absolute 0.5 0.1 - 1.0 K/uL   Eosinophils Relative 2 %   Eosinophils Absolute 0.3 0.0 - 0.5 K/uL   Basophils Relative 0 %   Basophils Absolute 0.0 0.0 - 0.1 K/uL  Comprehensive metabolic panel     Status: Abnormal   Collection Time: 08/24/18 11:45 AM  Result Value Ref Range   Sodium 133 (L) 135 - 145 mmol/L   Potassium 3.6 3.5 - 5.1 mmol/L   Chloride 102 98 - 111 mmol/L   CO2 21 (L) 22 - 32 mmol/L   Glucose, Bld 81 70 - 99 mg/dL  BUN 7 6 - 20 mg/dL   Creatinine, Ser 1.51 0.44 - 1.00 mg/dL   Calcium 8.7 (L) 8.9 - 10.3 mg/dL   Total Protein  7.8 6.5 - 8.1 g/dL   Albumin 3.3 (L) 3.5 - 5.0 g/dL   AST 19 15 - 41 U/L   ALT 17 0 - 44 U/L   Alkaline Phosphatase 80 38 - 126 U/L   Total Bilirubin 0.7 0.3 - 1.2 mg/dL   GFR calc non Af Amer >60 >60 mL/min   GFR calc Af Amer >60 >60 mL/min   Anion gap 10 5 - 15  Uric acid     Status: None   Collection Time: 08/24/18 11:45 AM  Result Value Ref Range   Uric Acid, Serum 3.1 2.5 - 7.1 mg/dL  Lactate dehydrogenase     Status: None   Collection Time: 08/24/18 11:45 AM  Result Value Ref Range   LDH 149 98 - 192 U/L    Physical Exam  Nursing note and vitals reviewed. Constitutional: She is oriented to person, place, and time. She appears well-developed and well-nourished.  HENT:  Head: Normocephalic and atraumatic.  Eyes: Pupils are equal, round, and reactive to light.  Cardiovascular: Normal rate, regular rhythm and normal heart sounds.  Respiratory: Effort normal and breath sounds normal. No respiratory distress.  GI: There is no abdominal tenderness.  Musculoskeletal: Normal range of motion.  Neurological: She is alert and oriented to person, place, and time. She has normal reflexes.  Skin: Skin is warm.  Psychiatric: She has a normal mood and affect. Her behavior is normal. Judgment and thought content normal.    MAU Course  Procedures NST PIH labs  MDM Patient with headache and elevated blood pressures, some are severe range but labetalol protocol not needed. Preeclampsia labs are negative but PCR was borderline at 0.26, over twice as high as when it was drawn about a month ago. Fioricet reduced her pain to 4/10. Consulted Dr. Sallye Ober regarding plan of care: patient to collect 24 hour urine and provide sample to [facility] tomorrow. She is to follow up with [facility] for an appointment tomorrow for ROB and blood pressure check. We are not increasing her Procardia, just recommending that she needs to take it as ordered QD without missing any doses. Patient can take 1-2 Fioricet as needed for  headache unrelieved by tylenol q6hrs but does not require a refill at this time as she has a good supply at home. Preeclampsia precautions discussed and handout given. Patient to take blood pressure at home at least daily and to call if elevated >=150/100 and present to mau if >=160/110.   Assessment and Plan  33 y.o. G2P1 at [redacted]w[redacted]d Chronic hypertension on Procardia XL 30mg  QD (and patient to actually take it daily) Reactive NST, category 1 FHTs History of migraine headache  Consulted Dr. Sallye Ober: 1-2 Fioricet as needed for headache unrelieved by Tylenol q6hrs  24 hr urine collection Take blood pressures at home daily and call if >=150/100 and present to MAU if >=160/110 Preeclampsia precautions discussed and handout given Patient to follow up tomorrow at [facility] for [facility] appointment with BP check Discharged home in stable condition   Janeece Riggers 08/24/2018, 1:24 PM

## 2018-08-24 NOTE — MAU Note (Signed)
Called office- was told to come over for pre-e eval.  Missed a couple doses this morning.  Started back on Mon.  Woke up early this morning with a HA.  Checked her BP- it was elevated and took medication for HA.  Laid down awhile, BP still up.  Denies visual changes, epigastric pain, has been swelling a little in her hands off and on since Monday.

## 2018-08-24 NOTE — Discharge Instructions (Signed)
24-Hour Urine Collection Introduction A 24-hour urine specimen is a lab test that requires collection of all your urine for an entire day. This is sometimes called a timed urine test. It can provide more information than a single urine sample. There are many reasons to have this test. Your health care provider may order the test to check for or monitor the following conditions:  High blood pressure.  Kidney disease.  Kidney stones.  Urinary tract infections.  Pregnancy.  Diabetes. How do I prepare for this test?  You may be asked to follow a special diet during or before the collection period. Follow any instructions from your health care provider. If no special instructions are given, you may eat and drink normally.  Take over-the-counter and prescription medicines only as told by your health care provider.  Let your health care provider know about any medicines that you are taking, including over-the-counter medicines, vitamins, herbs, and supplements.  Choose a collection day when you can be at home or when you have a place to store the urine. All urine must be collected during the testing period. How do I do a 24-hour urine collection?   When you get up in the morning, urinate in the toilet and flush. Write down the time. This will be your start time on the day of collection and your end time on the next morning.  From the start time on, all of your urine should be kept in the collection jug that you received from the lab.  If the jug that is given to you already has liquid in it, that is okay. Do not throw out the liquid or rinse out the jug. Some tests need the liquid to be added to your urine.  Urinate into a specimen container, such as a urinal or pan that sits over the toilet. Pour the urine from the container into the collection jug. Be careful not to spill any of the urine. Use the equipment provided by the lab.  Do not let any toilet paper or stool (feces) get into the  jug. This will contaminate the sample.  Stop collecting your urine 24 hours after you started. Collect the last specimen as close as possible to the end of the 24-hour period.  Keep the jug cool in an ice chest or keep it in the refrigerator during collection.  When the 24-hour collection is complete, bring the jug to the lab. Keep the jug cool in an ice chest while you are bringing it to the lab. What do the results mean? Talk with your health care provider about what your results mean. Questions to ask your health care provider Ask your health care provider, or the department that is doing the test:  When will my results be ready?  How will I get my results?  What are my treatment options?  What other tests do I need?  What are my next steps? Summary  A 24-hour urine specimen is a lab test that requires collection of all your urine for an entire day.  When you get up in the morning, urinate in the toilet and flush. Write down the time. For the next 24 hours, collect all of your urine in the collection jug that you received from the lab.  Keep the jug cool while collecting the urine and while bringing it back to the lab.  Take the jug of urine back to the lab as soon as possible after the collection period has ended. This information  is not intended to replace advice given to you by your health care provider. Make sure you discuss any questions you have with your health care provider. Document Released: 09/18/2008 Document Revised: 07/05/2017 Document Reviewed: 07/05/2017 Elsevier Interactive Patient Education  2019 [employer]   Preeclampsia and Eclampsia  Preeclampsia is a serious condition that may develop during pregnancy. It is also called toxemia of pregnancy. This condition causes high blood pressure along with other symptoms, such as swelling and headaches. These symptoms may develop as the condition gets worse. Preeclampsia may occur at 20 weeks of pregnancy or  later. Diagnosing and treating preeclampsia early is very important. If not treated early, it can cause serious problems for you and your baby. One problem it can lead to is eclampsia. Eclampsia is a condition that causes muscle jerking or shaking (convulsions or seizures) and other serious problems for the mother. During pregnancy, delivering your baby may be the best treatment for preeclampsia or eclampsia. For most women, preeclampsia and eclampsia symptoms go away after giving birth. In rare cases, a woman may develop preeclampsia after giving birth (postpartum preeclampsia). This usually occurs within 48 hours after childbirth but may occur up to 6 weeks after giving birth. What are the causes? The cause of preeclampsia is not known. What increases the risk? The following risk factors make you more likely to develop preeclampsia:  Being pregnant for the first time.  Having had preeclampsia during a past pregnancy.  Having a family history of preeclampsia.  Having high blood pressure.  Being pregnant with more than one baby.  Being 18 or older.  Being African-American.  Having kidney disease or diabetes.  Having medical conditions such as lupus or blood diseases.  Being very overweight (obese). What are the signs or symptoms? The earliest signs of preeclampsia are:  High blood pressure.  Increased protein in your urine. Your health care provider will check for this at every visit before you give birth (prenatal visit). Other symptoms that may develop as the condition gets worse include:  Severe headaches.  Sudden weight gain.  Swelling of the hands, face, legs, and feet.  Nausea and vomiting.  Vision problems, such as blurred or double vision.  Numbness in the face, arms, legs, and feet.  Urinating less than usual.  Dizziness.  Slurred speech.  Abdominal pain, especially upper abdominal pain.  Convulsions or seizures. How is this diagnosed? There are no  screening tests for preeclampsia. Your health care provider will ask you about symptoms and check for signs of preeclampsia during your prenatal visits. You may also have tests that include:  Urine tests.  Blood tests.  Checking your blood pressure.  Monitoring your babys heart rate.  Ultrasound. How is this treated? You and your health care provider will determine the treatment approach that is best for you. Treatment may include:  Having more frequent prenatal exams to check for signs of preeclampsia, if you have an increased risk for preeclampsia.  Medicine to lower your blood pressure.  Staying in the hospital, if your condition is severe. There, treatment will focus on controlling your blood pressure and the amount of fluids in your body (fluid retention).  Taking medicine (magnesium sulfate) to prevent seizures. This may be given as an injection or through an IV.  Taking a low-dose aspirin during your pregnancy.  Delivering your baby early, if your condition gets worse. You may have your labor started with medicine (induced), or you may have a cesarean delivery. Follow these instructions  at home: Eating and drinking   Drink enough fluid to keep your urine pale yellow.  Avoid caffeine. Lifestyle  Do not use any products that contain nicotine or tobacco, such as cigarettes and e-cigarettes. If you need help quitting, ask your health care provider.  Do not use alcohol or drugs.  Avoid stress as much as possible. Rest and get plenty of sleep. General instructions  Take over-the-counter and prescription medicines only as told by your health care provider.  When lying down, lie on your left side. This keeps pressure off your major blood vessels.  When sitting or lying down, raise (elevate) your feet. Try putting some pillows underneath your lower legs.  Exercise regularly. Ask your health care provider what kinds of exercise are best for you.  Keep all follow-up and  prenatal visits as told by your health care provider. This is important. How is this prevented? There is no known way of preventing preeclampsia or eclampsia from developing. However, to lower your risk of complications and detect problems early:  Get regular prenatal care. Your health care provider may be able to diagnose and treat the condition early.  Maintain a healthy weight. Ask your health care provider for help managing weight gain during pregnancy.  Work with your health care provider to manage any long-term (chronic) health conditions you have, such as diabetes or kidney problems.  You may have tests of your blood pressure and kidney function after giving birth.  Your health care provider may have you take low-dose aspirin during your next pregnancy. Contact a health care provider if:  You have symptoms that your health care provider told you may require more treatment or monitoring, such as: ? Headaches. ? Nausea or vomiting. ? Abdominal pain. ? Dizziness. ? Light-headedness. Get help right away if:  You have severe: ? Abdominal pain. ? Headaches that do not get better. ? Dizziness. ? Vision problems. ? Confusion. ? Nausea or vomiting.  You have any of the following: ? A seizure. ? Sudden, rapid weight gain. ? Sudden swelling in your hands, ankles, or face. ? Trouble moving any part of your body. ? Numbness in any part of your body. ? Trouble speaking. ? Abnormal bleeding.  You faint. Summary  Preeclampsia is a serious condition that may develop during pregnancy. It is also called toxemia of pregnancy.  This condition causes high blood pressure along with other symptoms, such as swelling and headaches.  Diagnosing and treating preeclampsia early is very important. If not treated early, it can cause serious problems for you and your baby.  Get help right away if you have symptoms that your health care provider told you to watch for. This information is not  intended to replace advice given to you by your health care provider. Make sure you discuss any questions you have with your health care provider. Document Released: 06/19/2000 Document Revised: 06/08/2017 Document Reviewed: 01/27/2016 Elsevier Interactive Patient Education  2019 [employer].

## 2018-08-25 DIAGNOSIS — O139 Gestational [pregnancy-induced] hypertension without significant proteinuria, unspecified trimester: Secondary | ICD-10-CM | POA: Diagnosis not present

## 2018-08-25 DIAGNOSIS — R030 Elevated blood-pressure reading, without diagnosis of hypertension: Secondary | ICD-10-CM | POA: Diagnosis not present

## 2018-08-25 DIAGNOSIS — Z8349 Family history of other endocrine, nutritional and metabolic diseases: Secondary | ICD-10-CM | POA: Diagnosis not present

## 2018-09-01 DIAGNOSIS — O139 Gestational [pregnancy-induced] hypertension without significant proteinuria, unspecified trimester: Secondary | ICD-10-CM | POA: Diagnosis not present

## 2018-09-07 DIAGNOSIS — O365931 Maternal care for other known or suspected poor fetal growth, third trimester, fetus 1: Secondary | ICD-10-CM | POA: Diagnosis not present

## 2018-09-07 DIAGNOSIS — Z8759 Personal history of other complications of pregnancy, childbirth and the puerperium: Secondary | ICD-10-CM | POA: Diagnosis not present

## 2018-09-07 DIAGNOSIS — Z3A30 30 weeks gestation of pregnancy: Secondary | ICD-10-CM | POA: Diagnosis not present

## 2018-09-20 DIAGNOSIS — O1493 Unspecified pre-eclampsia, third trimester: Secondary | ICD-10-CM | POA: Diagnosis not present

## 2018-09-20 DIAGNOSIS — Z3A32 32 weeks gestation of pregnancy: Secondary | ICD-10-CM | POA: Diagnosis not present

## 2018-09-20 DIAGNOSIS — O10019 Pre-existing essential hypertension complicating pregnancy, unspecified trimester: Secondary | ICD-10-CM | POA: Diagnosis not present

## 2018-09-23 DIAGNOSIS — Z3482 Encounter for supervision of other normal pregnancy, second trimester: Secondary | ICD-10-CM | POA: Diagnosis not present

## 2018-09-23 DIAGNOSIS — Z3483 Encounter for supervision of other normal pregnancy, third trimester: Secondary | ICD-10-CM | POA: Diagnosis not present

## 2018-09-28 ENCOUNTER — Other Ambulatory Visit: Payer: Self-pay

## 2018-09-28 ENCOUNTER — Encounter (HOSPITAL_COMMUNITY): Payer: Self-pay

## 2018-09-28 ENCOUNTER — Inpatient Hospital Stay (HOSPITAL_COMMUNITY)
Admission: AD | Admit: 2018-09-28 | Discharge: 2018-09-29 | DRG: 833 | Disposition: A | Discharge status: Home / Self Care | Payer: 59 | Attending: Obstetrics and Gynecology | Admitting: Obstetrics and Gynecology

## 2018-09-28 DIAGNOSIS — O10013 Pre-existing essential hypertension complicating pregnancy, third trimester: Secondary | ICD-10-CM | POA: Diagnosis not present

## 2018-09-28 DIAGNOSIS — Z3A33 33 weeks gestation of pregnancy: Secondary | ICD-10-CM

## 2018-09-28 DIAGNOSIS — Z362 Encounter for other antenatal screening follow-up: Secondary | ICD-10-CM | POA: Diagnosis not present

## 2018-09-28 DIAGNOSIS — O10919 Unspecified pre-existing hypertension complicating pregnancy, unspecified trimester: Secondary | ICD-10-CM | POA: Diagnosis present

## 2018-09-28 DIAGNOSIS — O99345 Other mental disorders complicating the puerperium: Secondary | ICD-10-CM

## 2018-09-28 DIAGNOSIS — F53 Postpartum depression: Secondary | ICD-10-CM | POA: Insufficient documentation

## 2018-09-28 DIAGNOSIS — O133 Gestational [pregnancy-induced] hypertension without significant proteinuria, third trimester: Secondary | ICD-10-CM | POA: Diagnosis not present

## 2018-09-28 DIAGNOSIS — O26873 Cervical shortening, third trimester: Secondary | ICD-10-CM | POA: Insufficient documentation

## 2018-09-28 LAB — CBC WITH DIFFERENTIAL/PLATELET
Abs Immature Granulocytes: 0.11 10*3/uL — ABNORMAL HIGH (ref 0.00–0.07)
Basophils Absolute: 0 10*3/uL (ref 0.0–0.1)
Basophils Relative: 0 %
Eosinophils Absolute: 0.2 10*3/uL (ref 0.0–0.5)
Eosinophils Relative: 1 %
HCT: 32.3 % — ABNORMAL LOW (ref 36.0–46.0)
HEMOGLOBIN: 10.3 g/dL — AB (ref 12.0–15.0)
Immature Granulocytes: 1 %
LYMPHS PCT: 20 %
Lymphs Abs: 2.3 10*3/uL (ref 0.7–4.0)
MCH: 28.3 pg (ref 26.0–34.0)
MCHC: 31.9 g/dL (ref 30.0–36.0)
MCV: 88.7 fL (ref 80.0–100.0)
Monocytes Absolute: 0.6 10*3/uL (ref 0.1–1.0)
Monocytes Relative: 5 %
NEUTROS PCT: 73 %
Neutro Abs: 8.7 10*3/uL — ABNORMAL HIGH (ref 1.7–7.7)
Platelets: 368 10*3/uL (ref 150–400)
RBC: 3.64 MIL/uL — ABNORMAL LOW (ref 3.87–5.11)
RDW: 14.5 % (ref 11.5–15.5)
WBC: 11.9 10*3/uL — ABNORMAL HIGH (ref 4.0–10.5)
nRBC: 0 % (ref 0.0–0.2)

## 2018-09-28 LAB — TYPE AND SCREEN
ABO/RH(D): O POS
Antibody Screen: NEGATIVE

## 2018-09-28 LAB — COMPREHENSIVE METABOLIC PANEL
ALT: 24 U/L (ref 0–44)
AST: 41 U/L (ref 15–41)
Albumin: 2.8 g/dL — ABNORMAL LOW (ref 3.5–5.0)
Alkaline Phosphatase: 132 U/L — ABNORMAL HIGH (ref 38–126)
Anion gap: 9 (ref 5–15)
BUN: 8 mg/dL (ref 6–20)
CO2: 17 mmol/L — ABNORMAL LOW (ref 22–32)
Calcium: 8.9 mg/dL (ref 8.9–10.3)
Chloride: 108 mmol/L (ref 98–111)
Creatinine, Ser: 0.6 mg/dL (ref 0.44–1.00)
GFR calc Af Amer: >60 mL/min (ref >60)
GFR calc non Af Amer: >60 mL/min (ref >60)
Glucose, Bld: 81 mg/dL (ref 70–99)
Potassium: 5.4 mmol/L — ABNORMAL HIGH (ref 3.5–5.1)
SODIUM: 134 mmol/L — AB (ref 135–145)
Total Bilirubin: 1.2 mg/dL (ref 0.3–1.2)
Total Protein: 6.3 g/dL — ABNORMAL LOW (ref 6.5–8.1)

## 2018-09-28 LAB — PROTEIN / CREATININE RATIO, URINE
Creatinine, Urine: 124.7 mg/dL
Protein Creatinine Ratio: 0.26 mg/mg{Cre} — ABNORMAL HIGH (ref 0.00–0.15)
Total Protein, Urine: 32 mg/dL

## 2018-09-28 LAB — OB RESULTS CONSOLE GBS: GBS: NEGATIVE

## 2018-09-28 LAB — GROUP B STREP BY PCR: Group B strep by PCR: NEGATIVE

## 2018-09-28 LAB — URIC ACID: Uric Acid, Serum: 5.2 mg/dL (ref 2.5–7.1)

## 2018-09-28 LAB — LACTATE DEHYDROGENASE: LDH: 349 U/L — ABNORMAL HIGH (ref 98–192)

## 2018-09-28 LAB — ABO/RH: ABO/RH(D): O POS

## 2018-09-28 MED ORDER — BETAMETHASONE SOD PHOS & ACET 6 (3-3) MG/ML IJ SUSP
12.0000 mg | INTRAMUSCULAR | Status: AC
  Administered 2018-09-28 – 2018-09-29 (×2): 12 mg via INTRAMUSCULAR
  Filled 2018-09-28 (×2): qty 2

## 2018-09-28 MED ORDER — SODIUM CHLORIDE 0.9 % IV SOLN: 250.0000 mL | INTRAVENOUS | Status: DC | PRN

## 2018-09-28 MED ORDER — HYDRALAZINE HCL 20 MG/ML IJ SOLN: 10.0000 mg | INTRAMUSCULAR | Status: DC | PRN

## 2018-09-28 MED ORDER — CALCIUM CARBONATE ANTACID 500 MG PO CHEW: 2.0000 | CHEWABLE_TABLET | ORAL | Status: DC | PRN

## 2018-09-28 MED ORDER — LABETALOL HCL 5 MG/ML IV SOLN: 80.0000 mg | INTRAVENOUS | Status: DC | PRN

## 2018-09-28 MED ORDER — SODIUM CHLORIDE 0.9% FLUSH
3.0000 mL | Freq: Two times a day (BID) | INTRAVENOUS | Status: DC
  Administered 2018-09-28 – 2018-09-29 (×2): 3 mL via INTRAVENOUS

## 2018-09-28 MED ORDER — VALACYCLOVIR HCL 500 MG PO TABS: 500.0000 mg | ORAL_TABLET | Freq: Every day | ORAL | Status: DC

## 2018-09-28 MED ORDER — PRENATAL MULTIVITAMIN CH
1.0000 | ORAL_TABLET | Freq: Every day | ORAL | Status: DC
  Administered 2018-09-28 – 2018-09-29 (×2): 1 via ORAL
  Filled 2018-09-28 (×2): qty 1

## 2018-09-28 MED ORDER — SODIUM CHLORIDE 0.9% FLUSH: 3.0000 mL | INTRAVENOUS | Status: DC | PRN

## 2018-09-28 MED ORDER — ZOLPIDEM TARTRATE 5 MG PO TABS: 5.0000 mg | ORAL_TABLET | Freq: Every evening | ORAL | Status: DC | PRN

## 2018-09-28 MED ORDER — ASPIRIN 81 MG PO CHEW
81.0000 mg | CHEWABLE_TABLET | Freq: Every day | ORAL | Status: DC
  Administered 2018-09-29: 81 mg via ORAL
  Filled 2018-09-28: qty 1

## 2018-09-28 MED ORDER — DOCUSATE SODIUM 100 MG PO CAPS
100.0000 mg | ORAL_CAPSULE | Freq: Every day | ORAL | Status: DC
  Administered 2018-09-28 – 2018-09-29 (×2): 100 mg via ORAL
  Filled 2018-09-28 (×2): qty 1

## 2018-09-28 MED ORDER — NIFEDIPINE ER OSMOTIC RELEASE 30 MG PO TB24
90.0000 mg | ORAL_TABLET | Freq: Every day | ORAL | Status: DC
  Administered 2018-09-29: 90 mg via ORAL
  Filled 2018-09-28: qty 3

## 2018-09-28 MED ORDER — LABETALOL HCL 5 MG/ML IV SOLN
40.0000 mg | INTRAVENOUS | Status: DC | PRN
  Administered 2018-09-28: 40 mg via INTRAVENOUS
  Filled 2018-09-28: qty 8

## 2018-09-28 MED ORDER — LABETALOL HCL 5 MG/ML IV SOLN
20.0000 mg | INTRAVENOUS | Status: DC | PRN
  Administered 2018-09-28: 20 mg via INTRAVENOUS
  Filled 2018-09-28: qty 4

## 2018-09-28 MED ORDER — ACETAMINOPHEN 325 MG PO TABS
650.0000 mg | ORAL_TABLET | ORAL | Status: DC | PRN
  Administered 2018-09-28: 650 mg via ORAL
  Filled 2018-09-28: qty 2

## 2018-09-28 NOTE — MAU Note (Signed)
Pt has hx of HTN. Took 175/131 0930, took meds, was 185/133. Waited an hour and 178/128. No HA, or visual changes or RUQ.

## 2018-09-28 NOTE — H&P (Signed)
Meredith Velez is a 33 y.o. female presenting for chronic hypertension in pregnancy with severe range blood pressures. OB History    Gravida  2   Para  1   Term  1   Preterm  0   AB  0   Living  1     SAB  0   TAB  0   Ectopic  0   Multiple  0   Live Births  1          Past Medical History:  Diagnosis Date  . Depression   . Frequent headaches   . GERD (gastroesophageal reflux disease)   . Hypertension   . Migraines   . Ulcer, stomach peptic   . UTI (lower urinary tract infection)    Past Surgical History:  Procedure Laterality Date  . NO PAST SURGERIES     Family History: family history includes Alzheimer's disease in her paternal grandmother; Breast cancer in her maternal aunt; Diabetes in her father and mother; Heart disease in her mother; Hyperlipidemia in her mother; Hypertension in her brother, father, and mother; Kidney disease in her mother; Lung cancer in her maternal uncle; Prostate cancer in her maternal grandfather and maternal grandmother; Stroke in her father. Social History:  reports that she has never smoked. She has never used smokeless tobacco. She reports that she does not drink alcohol or use drugs.     Maternal Diabetes: No Genetic Screening: Normal Maternal Ultrasounds/Referrals: Normal Fetal Ultrasounds or other Referrals:  Other: Antenatal testing from 32 weeks and growth U/S from 28 weeks for chronic hypertension Maternal Substance Abuse:  No Significant Maternal Medications:  None Significant Maternal Lab Results:  None Other Comments:  None  Review of Systems  Constitutional: Positive for malaise/fatigue.  Eyes: Negative for blurred vision.  Gastrointestinal: Negative for abdominal pain.  Neurological: Negative for headaches.  All other systems reviewed and are negative.  Vitals:   09/28/18 1330 09/28/18 1345 09/28/18 1400 09/28/18 1439  BP: 130/84 127/84 130/87 125/82  Pulse: 97 97 100 98  Resp:    18  Temp:    98.1  F (36.7 C)  TempSrc:    Oral  SpO2:    100%  Weight:      Height:       Results for orders placed or performed during the hospital encounter of 09/28/18 (from the past 24 hour(s))  Protein / creatinine ratio, urine     Status: Abnormal   Collection Time: 09/28/18 11:58 AM  Result Value Ref Range   Creatinine, Urine 124.70 mg/dL   Total Protein, Urine 32 mg/dL   Protein Creatinine Ratio 0.26 (H) 0.00 - 0.15 mg/mg[Cre]  Type and screen [facility]     Status: None   Collection Time: 09/28/18 12:20 PM  Result Value Ref Range   ABO/RH(D) O POS    Antibody Screen NEG    Sample Expiration      10/01/2018 Performed at [facility], [address], [place], [place] [address]   ABO/Rh     Status: None   Collection Time: 09/28/18 12:20 PM  Result Value Ref Range   ABO/RH(D)      O POS Performed at [facility], [address], [place], [place] [address]   Comprehensive metabolic panel     Status: Abnormal   Collection Time: 09/28/18 12:27 PM  Result Value Ref Range   Sodium 134 (L) 135 - 145 mmol/L   Potassium 5.4 (  H) 3.5 - 5.1 mmol/L   Chloride 108 98 - 111 mmol/L   CO2 17 (L) 22 - 32 mmol/L   Glucose, Bld 81 70 - 99 mg/dL   BUN 8 6 - 20 mg/dL   Creatinine, Ser 2.54 0.44 - 1.00 mg/dL   Calcium 8.9 8.9 - 27.0 mg/dL   Total Protein 6.3 (L) 6.5 - 8.1 g/dL   Albumin 2.8 (L) 3.5 - 5.0 g/dL   AST 41 15 - 41 U/L   ALT 24 0 - 44 U/L   Alkaline Phosphatase 132 (H) 38 - 126 U/L   Total Bilirubin 1.2 0.3 - 1.2 mg/dL   GFR calc non Af Amer >60 >60 mL/min   GFR calc Af Amer >60 >60 mL/min   Anion gap 9 5 - 15  CBC with Differential     Status: Abnormal   Collection Time: 09/28/18 12:27 PM  Result Value Ref Range   WBC 11.9 (H) 4.0 - 10.5 K/uL   RBC 3.64 (L) 3.87 - 5.11 MIL/uL   Hemoglobin 10.3 (L) 12.0 - 15.0 g/dL   HCT 62.3 (L) 76.2 - 83.1 %   MCV 88.7 80.0 - 100.0 fL   MCH 28.3 26.0 - 34.0 pg   MCHC 31.9 30.0 - 36.0 g/dL   RDW 51.7 61.6 - 07.3 %    Platelets 368 150 - 400 K/uL   nRBC 0.0 0.0 - 0.2 %   Neutrophils Relative % 73 %   Neutro Abs 8.7 (H) 1.7 - 7.7 K/uL   Lymphocytes Relative 20 %   Lymphs Abs 2.3 0.7 - 4.0 K/uL   Monocytes Relative 5 %   Monocytes Absolute 0.6 0.1 - 1.0 K/uL   Eosinophils Relative 1 %   Eosinophils Absolute 0.2 0.0 - 0.5 K/uL   Basophils Relative 0 %   Basophils Absolute 0.0 0.0 - 0.1 K/uL   Immature Granulocytes 1 %   Abs Immature Granulocytes 0.11 (H) 0.00 - 0.07 K/uL  Lactate dehydrogenase     Status: Abnormal   Collection Time: 09/28/18 12:27 PM  Result Value Ref Range   LDH 349 (H) 98 - 192 U/L  Uric acid     Status: None   Collection Time: 09/28/18 12:27 PM  Result Value Ref Range   Uric Acid, Serum 5.2 2.5 - 7.1 mg/dL     Fetal Exam Fetal Monitor Review: Mode: ultrasound.   Baseline rate: 150s.  Variability: moderate (6-25 bpm).   Pattern: accelerations present and no decelerations.    Fetal State Assessment: Category I - tracings are normal.     Physical Exam  Nursing note and vitals reviewed. Constitutional: She is oriented to person, place, and time. She appears well-developed and well-nourished.  HENT:  Head: Normocephalic and atraumatic.  Eyes: Pupils are equal, round, and reactive to light.  Cardiovascular: Normal rate, regular rhythm and normal heart sounds.  Respiratory: Effort normal and breath sounds normal. No respiratory distress.  GI: There is no abdominal tenderness.  Musculoskeletal: Normal range of motion.  Neurological: She is alert and oriented to person, place, and time.  Skin: Skin is warm and dry.  Psychiatric: She has a normal mood and affect. Her behavior is normal. Judgment and thought content normal.    Prenatal labs: ABO, Rh: --/--/O POS, O POS Performed at [facility], [address] [place], [place] [address]  [phone]/25 1220) Antibody: NEG (03/25 1220) Rubella:  Immune RPR:   NR HBsAg:   NR HIV:   NR  GBS:   Pending    Assessment/Plan: 33 y.o. G2P1 at [redacted]w[redacted]d Chronic hypertension on Procardia XL 90mg  QD and 81mg  Aspirin QD Severe range blood pressures Category 1 FHTs and reactive NST Admit to antepartum observation Betamethasone x2 doses 24hr urine protein NSTs TID Labletalol protocol GBS by PCR results pending  Close monitoring of blood pressure, will assess for need to increase Procardia dosage  Janeece Riggers 09/28/2018, 2:47 PM

## 2018-09-29 ENCOUNTER — Inpatient Hospital Stay (HOSPITAL_COMMUNITY): Payer: 59

## 2018-09-29 DIAGNOSIS — Z3A33 33 weeks gestation of pregnancy: Secondary | ICD-10-CM

## 2018-09-29 DIAGNOSIS — Z362 Encounter for other antenatal screening follow-up: Secondary | ICD-10-CM | POA: Diagnosis not present

## 2018-09-29 DIAGNOSIS — O133 Gestational [pregnancy-induced] hypertension without significant proteinuria, third trimester: Secondary | ICD-10-CM | POA: Diagnosis present

## 2018-09-29 DIAGNOSIS — O10013 Pre-existing essential hypertension complicating pregnancy, third trimester: Secondary | ICD-10-CM | POA: Diagnosis present

## 2018-09-29 LAB — PROTEIN, URINE, 24 HOUR
Collection Interval-UPROT: 24 hours
Protein, 24H Urine: 220 mg/d — ABNORMAL HIGH (ref 50–100)
Protein, Urine: 22 mg/dL
Urine Total Volume-UPROT: 1000 mL

## 2018-09-29 NOTE — Progress Notes (Addendum)
Meredith Velez is a 33 y.o. female patient, G2P1 at [redacted]w[redacted]d. 1. Chronic hypertension affecting pregnancy   2. Chronic hypertension affecting pregnancy    Past Medical History:  Diagnosis Date  . Depression   . Frequent headaches   . GERD (gastroesophageal reflux disease)   . Hypertension   . Migraines   . Ulcer, stomach peptic   . UTI (lower urinary tract infection)    Current Facility-Administered Medications  Medication Dose Route Frequency Provider Last Rate Last Dose  . 0.9 %  sodium chloride infusion  250 mL Intravenous PRN Janeece Riggers, CNM      . acetaminophen (TYLENOL) tablet 650 mg  650 mg Oral Q4H PRN Janeece Riggers, CNM   650 mg at 09/28/18 2037  . aspirin chewable tablet 81 mg  81 mg Oral Daily Janeece Riggers, CNM   81 mg at 09/29/18 4827  . betamethasone acetate-betamethasone sodium phosphate (CELESTONE) injection 12 mg  12 mg Intramuscular Q24 Hr x 2 Janeece Riggers, CNM   12 mg at 09/28/18 1405  . calcium carbonate (TUMS - dosed in mg elemental calcium) chewable tablet 400 mg of elemental calcium  2 tablet Oral Q4H PRN Janeece Riggers, CNM      . docusate sodium (COLACE) capsule 100 mg  100 mg Oral Daily Janeece Riggers, CNM   100 mg at 09/29/18 0954  . labetalol (NORMODYNE,TRANDATE) injection 20 mg  20 mg Intravenous PRN Janeece Riggers, CNM   20 mg at 09/28/18 1223   And  . labetalol (NORMODYNE,TRANDATE) injection 40 mg  40 mg Intravenous PRN Janeece Riggers, CNM   40 mg at 09/28/18 1243   And  . labetalol (NORMODYNE,TRANDATE) injection 80 mg  80 mg Intravenous PRN Janeece Riggers, CNM       And  . hydrALAZINE (APRESOLINE) injection 10 mg  10 mg Intravenous PRN Janeece Riggers, CNM      . NIFEdipine (PROCARDIA-XL/NIFEDICAL-XL) 24 hr tablet 90 mg  90 mg Oral Daily Janeece Riggers, CNM   90 mg at 09/29/18 0954  . prenatal multivitamin tablet 1 tablet  1 tablet Oral Q1200 Janeece Riggers, CNM   1 tablet at 09/28/18 1408  . sodium chloride flush (NS) 0.9 % injection 3 mL  3  mL Intravenous Q12H Janeece Riggers, CNM   3 mL at 09/29/18 0954  . sodium chloride flush (NS) 0.9 % injection 3 mL  3 mL Intravenous PRN Janeece Riggers, CNM      . zolpidem (AMBIEN) tablet 5 mg  5 mg Oral QHS PRN Janeece Riggers, CNM       No Known Allergies Active Problems:   Chronic hypertension affecting pregnancy  Subjective  Patient reports feeling well and was able to rest last night. She denies symptoms of preeclampsia. Discussed plan of care with Dr. Normand Sloop and with patient: second dose of betamethasone, monitoring blood pressures to see if meds need to be increased, and to have a BPP and growth ultrasound today with MFM consult.  Objective: Vital signs: (most recent): Blood pressure (!) 150/94, pulse (!) 101, temperature 98.2 F (36.8 C), temperature source Oral, resp. rate 18, height 5\' 2"  (1.575 m), weight 82.6 kg, SpO2 100 %.   Lungs:  Normal effort.  She is not in respiratory distress.   Extremities: Normal range of motion.   Neurological: Patient is alert.   Pupils:  Pupils are equal, round, and reactive to light.   Skin:  Warm and dry.      Vitals:   09/28/18 2025 09/29/18 0008 09/29/18 0444 09/29/18 0803  BP: 132/66 135/77 (!) 150/91 (!) 150/94  Pulse: (!) 105 (!) 101 93 (!) 101  Resp: 16 20 18 18   Temp: 98.1 F (36.7 C) 98.1 F (36.7 C) 98 F (36.7 C) 98.2 F (36.8 C)  TempSrc: Oral Oral Oral Oral  SpO2: 100% 100% 100% 100%  Weight:      Height:       Physical Exam  Vitals reviewed. Constitutional: She is oriented to person, place, and time. She appears well-developed and well-nourished.  HENT:  Head: Normocephalic.  Eyes: Pupils are equal, round, and reactive to light.  Respiratory: Effort normal. No respiratory distress.  GI: There is no abdominal tenderness.  Musculoskeletal: Normal range of motion.  Neurological: She is alert and oriented to person, place, and time.  Skin: Skin is warm and dry.  Psychiatric: She has a normal mood and affect. Her  behavior is normal. Judgment and thought content normal.   Assessment & Plan 33 y.o. G2P1 at [redacted]w[redacted]d Chronic hypertension on Procardia XL 90mg  QD Reactive NST Betamethasone 2nd dose this afternoon 24hr urine pending collection MFM ultrasound and consult pending  Will continue to monitor blood pressures and adjust medication as indicated  Janeece Riggers 09/29/2018  MFM consult appreciated. Pt has no symptoms and plan to send pt home once 24 hour urine is done.   Will follow up in the office weekly with labs and plan for   Delivery at 37 weeks

## 2018-09-29 NOTE — Progress Notes (Signed)
Discharge instructions given to patient. Discussed symptoms of hypertension including HA, blurred vision, RUQ and increased swelling. Patient instructed how to perform fetal kick counts.  Also discussed medication changes. Patient verbalized understanding.

## 2018-09-29 NOTE — Discharge Summary (Signed)
    OB Discharge Summary     Patient Name: Meredith Velez DOB: January 27, 1986 MRN: [id number]  Date of admission: 09/28/2018  Date of discharge: 09/29/2018  Admitting diagnosis: 33wks hbp Intrauterine pregnancy: [redacted]w[redacted]d     Secondary diagnosis:  Active Problems:   Chronic hypertension affecting pregnancy  Physical exam  Vitals:   09/29/18 0803 09/29/18 1146 09/29/18 1147 09/29/18 1608  BP: (!) 150/94 (!) 153/97 (!) 150/94 (!) 153/90  Pulse: (!) 101 (!) 109 (!) 103 (!) 111  Resp: 18  18 18   Temp: 98.2 F (36.8 C)  97.7 F (36.5 C) 98.3 F (36.8 C)  TempSrc: Oral  Oral Oral  SpO2: 100%  100% 100%  Weight:      Height:       General: alert, cooperative and no distress DVT Evaluation: No evidence of DVT seen on physical exam. Negative Homan's sign. No cords or calf tenderness. No significant calf/ankle edema. Chronic hypertension: Patient denies symptoms of preeclampsia. Per Dr. Normand Sloop and Dr. Judeth Cornfield patient is stable for discharge today. Plan to follow up in the office in 1 week for ROB and BPP. Patient to take her blood pressure daily and call if severe range or she develops symptoms of severe preeclampsia. Preeclampsia precautions discussed and handout given.   Labs: Lab Results  Component Value Date   WBC 11.9 (H) 09/28/2018   HGB 10.3 (L) 09/28/2018   HCT 32.3 (L) 09/28/2018   MCV 88.7 09/28/2018   PLT 368 09/28/2018   CMP Latest Ref Rng & Units 09/28/2018  Glucose 70 - 99 mg/dL 81  BUN 6 - 20 mg/dL 8  Creatinine 9.23 - 3.00 mg/dL 7.62  Sodium 263 - 335 mmol/L 134(L)  Potassium 3.5 - 5.1 mmol/L 5.4(H)  Chloride 98 - 111 mmol/L 108  CO2 22 - 32 mmol/L 17(L)  Calcium 8.9 - 10.3 mg/dL 8.9  Total Protein 6.5 - 8.1 g/dL 6.3(L)  Total Bilirubin 0.3 - 1.2 mg/dL 1.2  Alkaline Phos 38 - 126 U/L 132(H)  AST 15 - 41 U/L 41  ALT 0 - 44 U/L 24    Discharge instruction: per After Visit Summary and "Baby and Me Booklet". Preeclampsia handout given.   After visit meds:   Allergies as of 09/29/2018   No Known Allergies     Medication List    STOP taking these medications   butalbital-acetaminophen-caffeine 50-325-40 MG tablet Commonly known as:  FIORICET, ESGIC   ondansetron 8 MG disintegrating tablet Commonly known as:  Zofran ODT   promethazine 25 MG tablet Commonly known as:  PHENERGAN     TAKE these medications   aspirin 81 MG chewable tablet Chew 81 mg by mouth daily.   fluticasone 50 MCG/ACT nasal spray Commonly known as:  FLONASE Place 2 sprays into both nostrils daily.   NIFEdipine 90 MG 24 hr tablet Commonly known as:  PROCARDIA XL/NIFEDICAL-XL TK 1 T PO QD   prenatal multivitamin Tabs tablet Take 1 tablet by mouth daily at 12 noon.       Diet: routine diet  Activity: Advance as tolerated.  Outpatient follow up:1 week for ROB and BPP Follow up Appt:No future appointments. Follow up Visit:No follow-ups on file.  09/29/2018 Janeece Riggers, CNM

## 2018-09-29 NOTE — Discharge Instructions (Signed)
.  Preeclampsia and Eclampsia    Preeclampsia is a serious condition that may develop during pregnancy. It is also called toxemia of pregnancy. This condition causes high blood pressure along with other symptoms, such as swelling and headaches. These symptoms may develop as the condition gets worse. Preeclampsia may occur at 20 weeks of pregnancy or later.  Diagnosing and treating preeclampsia early is very important. If not treated early, it can cause serious problems for you and your baby. One problem it can lead to is eclampsia. Eclampsia is a condition that causes muscle jerking or shaking (convulsions or seizures) and other serious problems for the mother. During pregnancy, delivering your baby may be the best treatment for preeclampsia or eclampsia. For most women, preeclampsia and eclampsia symptoms go away after giving birth.  In rare cases, a woman may develop preeclampsia after giving birth (postpartum preeclampsia). This usually occurs within 48 hours after childbirth but may occur up to 6 weeks after giving birth.  What are the causes?  The cause of preeclampsia is not known.  What increases the risk?  The following risk factors make you more likely to develop preeclampsia:   Being pregnant for the first time.   Having had preeclampsia during a past pregnancy.   Having a family history of preeclampsia.   Having high blood pressure.   Being pregnant with more than one baby.   Being 35 or older.   Being African-American.   Having kidney disease or diabetes.   Having medical conditions such as lupus or blood diseases.   Being very overweight (obese).  What are the signs or symptoms?  The earliest signs of preeclampsia are:   High blood pressure.   Increased protein in your urine. Your health care provider will check for this at every visit before you give birth (prenatal visit).  Other symptoms that may develop as the condition gets worse include:   Severe headaches.   Sudden weight  gain.   Swelling of the hands, face, legs, and feet.   Nausea and vomiting.   Vision problems, such as blurred or double vision.   Numbness in the face, arms, legs, and feet.   Urinating less than usual.   Dizziness.   Slurred speech.   Abdominal pain, especially upper abdominal pain.   Convulsions or seizures.  How is this diagnosed?  There are no screening tests for preeclampsia. Your health care provider will ask you about symptoms and check for signs of preeclampsia during your prenatal visits. You may also have tests that include:   Urine tests.   Blood tests.   Checking your blood pressure.   Monitoring your baby's heart rate.   Ultrasound.  How is this treated?  You and your health care provider will determine the treatment approach that is best for you. Treatment may include:   Having more frequent prenatal exams to check for signs of preeclampsia, if you have an increased risk for preeclampsia.   Medicine to lower your blood pressure.   Staying in the hospital, if your condition is severe. There, treatment will focus on controlling your blood pressure and the amount of fluids in your body (fluid retention).   Taking medicine (magnesium sulfate) to prevent seizures. This may be given as an injection or through an IV.   Taking a low-dose aspirin during your pregnancy.   Delivering your baby early, if your condition gets worse. You may have your labor started with medicine (induced), or you may have a cesarean   delivery.  Follow these instructions at home:  Eating and drinking     Drink enough fluid to keep your urine pale yellow.   Avoid caffeine.  Lifestyle   Do not use any products that contain nicotine or tobacco, such as cigarettes and e-cigarettes. If you need help quitting, ask your health care provider.   Do not use alcohol or drugs.   Avoid stress as much as possible. Rest and get plenty of sleep.  General instructions   Take over-the-counter and prescription medicines only as  told by your health care provider.   When lying down, lie on your left side. This keeps pressure off your major blood vessels.   When sitting or lying down, raise (elevate) your feet. Try putting some pillows underneath your lower legs.   Exercise regularly. Ask your health care provider what kinds of exercise are best for you.   Keep all follow-up and prenatal visits as told by your health care provider. This is important.  How is this prevented?  There is no known way of preventing preeclampsia or eclampsia from developing. However, to lower your risk of complications and detect problems early:   Get regular prenatal care. Your health care provider may be able to diagnose and treat the condition early.   Maintain a healthy weight. Ask your health care provider for help managing weight gain during pregnancy.   Work with your health care provider to manage any long-term (chronic) health conditions you have, such as diabetes or kidney problems.   You may have tests of your blood pressure and kidney function after giving birth.   Your health care provider may have you take low-dose aspirin during your next pregnancy.  Contact a health care provider if:   You have symptoms that your health care provider told you may require more treatment or monitoring, such as:  ? Headaches.  ? Nausea or vomiting.  ? Abdominal pain.  ? Dizziness.  ? Light-headedness.  Get help right away if:   You have severe:  ? Abdominal pain.  ? Headaches that do not get better.  ? Dizziness.  ? Vision problems.  ? Confusion.  ? Nausea or vomiting.   You have any of the following:  ? A seizure.  ? Sudden, rapid weight gain.  ? Sudden swelling in your hands, ankles, or face.  ? Trouble moving any part of your body.  ? Numbness in any part of your body.  ? Trouble speaking.  ? Abnormal bleeding.   You faint.  Summary   Preeclampsia is a serious condition that may develop during pregnancy. It is also called toxemia of pregnancy.   This  condition causes high blood pressure along with other symptoms, such as swelling and headaches.   Diagnosing and treating preeclampsia early is very important. If not treated early, it can cause serious problems for you and your baby.   Get help right away if you have symptoms that your health care provider told you to watch for.  This information is not intended to replace advice given to you by your health care provider. Make sure you discuss any questions you have with your health care provider.  Document Released: 06/19/2000 Document Revised: 06/08/2017 Document Reviewed: 01/27/2016  [employer]vier Interactive Patient Education  2019 [employer]

## 2018-09-29 NOTE — Plan of Care (Signed)
  Problem: Education: Goal: Knowledge of disease or condition will improve Outcome: Progressing   

## 2018-10-05 DIAGNOSIS — O10019 Pre-existing essential hypertension complicating pregnancy, unspecified trimester: Secondary | ICD-10-CM | POA: Diagnosis not present

## 2018-10-05 DIAGNOSIS — Z3A34 34 weeks gestation of pregnancy: Secondary | ICD-10-CM | POA: Diagnosis not present

## 2018-10-05 DIAGNOSIS — O169 Unspecified maternal hypertension, unspecified trimester: Secondary | ICD-10-CM | POA: Diagnosis not present

## 2018-10-11 ENCOUNTER — Encounter (HOSPITAL_COMMUNITY): Payer: Self-pay | Admitting: *Deleted

## 2018-10-11 ENCOUNTER — Telehealth (HOSPITAL_COMMUNITY): Payer: Self-pay | Admitting: *Deleted

## 2018-10-11 DIAGNOSIS — Z369 Encounter for antenatal screening, unspecified: Secondary | ICD-10-CM | POA: Diagnosis not present

## 2018-10-11 DIAGNOSIS — Z3A35 35 weeks gestation of pregnancy: Secondary | ICD-10-CM | POA: Diagnosis not present

## 2018-10-11 DIAGNOSIS — O10019 Pre-existing essential hypertension complicating pregnancy, unspecified trimester: Secondary | ICD-10-CM | POA: Diagnosis not present

## 2018-10-11 NOTE — Telephone Encounter (Signed)
Preadmission screen  

## 2018-10-18 ENCOUNTER — Other Ambulatory Visit: Payer: Self-pay | Admitting: Obstetrics & Gynecology

## 2018-10-18 DIAGNOSIS — O139 Gestational [pregnancy-induced] hypertension without significant proteinuria, unspecified trimester: Secondary | ICD-10-CM | POA: Diagnosis not present

## 2018-10-18 DIAGNOSIS — Z3A36 36 weeks gestation of pregnancy: Secondary | ICD-10-CM | POA: Diagnosis not present

## 2018-10-18 DIAGNOSIS — Z369 Encounter for antenatal screening, unspecified: Secondary | ICD-10-CM | POA: Diagnosis not present

## 2018-10-19 ENCOUNTER — Other Ambulatory Visit (HOSPITAL_COMMUNITY): Payer: Self-pay | Admitting: *Deleted

## 2018-10-19 NOTE — H&P (Signed)
Meredith Velez is a 33 y.o. female presenting for chronic hypertension with superimposed preeclampsia and IUGR at 7%ile. EFW 5lbs2oz.   OB History    Gravida  2   Para  1   Term  1   Preterm  0   AB  0   Living  1     SAB  0   TAB  0   Ectopic  0   Multiple  0   Live Births  1          Past Medical History:  Diagnosis Date  . Anemia   . Depression    pp depression  . Frequent headaches   . GERD (gastroesophageal reflux disease)   . Hypertension   . Migraines   . Ulcer, stomach peptic   . UTI (lower urinary tract infection)    Past Surgical History:  Procedure Laterality Date  . NO PAST SURGERIES     Family History: family history includes Alzheimer's disease in her paternal grandmother; Breast cancer in her maternal aunt; Diabetes in her father and mother; Heart disease in her mother; Hyperlipidemia in her mother; Hypertension in her brother, father, and mother; Kidney disease in her mother; Lung cancer in her maternal uncle; Prostate cancer in her maternal grandfather and maternal grandmother; Stroke in her father. Social History:  reports that she has never smoked. She has never used smokeless tobacco. She reports that she does not drink alcohol or use drugs.     Maternal Diabetes: No Genetic Screening: Normal Maternal Ultrasounds/Referrals: Normal Fetal Ultrasounds or other Referrals:  Other: Antenatal testing from 32 weeks and growth U/S from 28 weeks for chronic hypertension  Maternal Substance Abuse:  No Significant Maternal Medications:  None Significant Maternal Lab Results:  None Other Comments:  Patient has history of HSV1 and has had oral cold sores. She does not take Valtrex and has never had a vaginal outbreak.   Review of Systems  Eyes: Negative for blurred vision.  Gastrointestinal: Negative for abdominal pain.  Neurological: Negative for headaches.  All other systems reviewed and are negative.  There were no vitals filed for this  visit.  Maternal Exam:  Abdomen: Patient reports no abdominal tenderness. Fundal height is Size < dates.   Estimated fetal weight is 5lbs2oz.   Fetal presentation: vertex  Introitus: Normal vulva. Vulva is negative for lesion.  Normal vagina.  Pelvis: adequate for delivery.   Cervix: Cervix evaluated by digital exam.     Fetal Exam Fetal Monitor Review: Mode: ultrasound.   Baseline rate: 150s.  Variability: moderate (6-25 bpm).   Pattern: accelerations present and no decelerations.    Fetal State Assessment: Category I - tracings are normal.     Physical Exam  Nursing note and vitals reviewed. Constitutional: She is oriented to person, place, and time. She appears well-developed and well-nourished.  HENT:  Head: Normocephalic and atraumatic.  Eyes: Pupils are equal, round, and reactive to light.  Cardiovascular: Normal rate, regular rhythm and normal heart sounds.  Respiratory: Effort normal and breath sounds normal. No respiratory distress.  GI: There is no abdominal tenderness.  Genitourinary:    Vulva normal.     No vulval lesion noted.   Musculoskeletal: Normal range of motion.  Neurological: She is alert and oriented to person, place, and time.  Skin: Skin is warm and dry.  Psychiatric: She has a normal mood and affect. Her behavior is normal. Judgment and thought content normal.   Prenatal labs: ABO, Rh: --/--/O  POS, O POS Performed at [facility], [address] [place], [place] [address]  [phone] 1220) Antibody: NEG (03/25 1220) Rubella: Immune (11/12 0000)Immune RPR: Nonreactive (11/12 0000) NR HBsAg: Negative (11/12 0000) NR HIV: Non-reactive (11/12 0000) NR GBS:   Negative   Assessment/Plan: 33 y.o. G2P1 at [redacted]w[redacted]d Chronic hypertension with superimposed preeclampsia on Procardia XL 90mg  QD and 81mg  Aspirin QD  Category 1 FHTs and reactive NST  Consulted Dr. Sallye Ober, if patient symptomatic for preeclampsia or has severe range pressures will start  MgSO4 CMP drawn, results pending  Admit to L&D Induction of labor for Meredith Velez with superimposed preeclampsia with IUGR Labletalol protocol PRN  Meredith Velez 10/20/2018, 1:13 AM

## 2018-10-20 ENCOUNTER — Other Ambulatory Visit: Payer: Self-pay

## 2018-10-20 ENCOUNTER — Inpatient Hospital Stay (HOSPITAL_COMMUNITY): Payer: 59

## 2018-10-20 ENCOUNTER — Inpatient Hospital Stay (HOSPITAL_COMMUNITY)
Admission: AD | Admit: 2018-10-20 | Discharge: 2018-10-23 | DRG: 807 | Disposition: A | Discharge status: Home / Self Care | Payer: 59 | Attending: Obstetrics & Gynecology | Admitting: Obstetrics & Gynecology

## 2018-10-20 ENCOUNTER — Encounter (HOSPITAL_COMMUNITY): Payer: Self-pay | Admitting: *Deleted

## 2018-10-20 DIAGNOSIS — O114 Pre-existing hypertension with pre-eclampsia, complicating childbirth: Secondary | ICD-10-CM | POA: Diagnosis not present

## 2018-10-20 DIAGNOSIS — R51 Headache: Secondary | ICD-10-CM | POA: Diagnosis not present

## 2018-10-20 DIAGNOSIS — O1002 Pre-existing essential hypertension complicating childbirth: Secondary | ICD-10-CM | POA: Diagnosis not present

## 2018-10-20 DIAGNOSIS — O9081 Anemia of the puerperium: Secondary | ICD-10-CM | POA: Diagnosis not present

## 2018-10-20 DIAGNOSIS — R404 Transient alteration of awareness: Secondary | ICD-10-CM | POA: Diagnosis not present

## 2018-10-20 DIAGNOSIS — O9089 Other complications of the puerperium, not elsewhere classified: Secondary | ICD-10-CM | POA: Diagnosis not present

## 2018-10-20 DIAGNOSIS — O365930 Maternal care for other known or suspected poor fetal growth, third trimester, not applicable or unspecified: Secondary | ICD-10-CM | POA: Diagnosis present

## 2018-10-20 DIAGNOSIS — Z3A36 36 weeks gestation of pregnancy: Secondary | ICD-10-CM | POA: Diagnosis not present

## 2018-10-20 DIAGNOSIS — O113 Pre-existing hypertension with pre-eclampsia, third trimester: Secondary | ICD-10-CM | POA: Diagnosis not present

## 2018-10-20 LAB — CBC
HCT: 34.5 % — ABNORMAL LOW (ref 36.0–46.0)
HCT: 36.6 % (ref 36.0–46.0)
Hemoglobin: 11 g/dL — ABNORMAL LOW (ref 12.0–15.0)
Hemoglobin: 11.5 g/dL — ABNORMAL LOW (ref 12.0–15.0)
MCH: 28.6 pg (ref 26.0–34.0)
MCH: 27.7 pg (ref 26.0–34.0)
MCHC: 31.9 g/dL (ref 30.0–36.0)
MCHC: 31.4 g/dL (ref 30.0–36.0)
MCV: 89.8 fL (ref 80.0–100.0)
MCV: 88.2 fL (ref 80.0–100.0)
Platelets: 344 10*3/uL (ref 150–400)
Platelets: 342 10*3/uL (ref 150–400)
RBC: 3.84 MIL/uL — ABNORMAL LOW (ref 3.87–5.11)
RBC: 4.15 MIL/uL (ref 3.87–5.11)
RDW: 16.8 % — ABNORMAL HIGH (ref 11.5–15.5)
RDW: 16.5 % — ABNORMAL HIGH (ref 11.5–15.5)
WBC: 12.1 10*3/uL — ABNORMAL HIGH (ref 4.0–10.5)
WBC: 13.1 10*3/uL — ABNORMAL HIGH (ref 4.0–10.5)
nRBC: 0 % (ref 0.0–0.2)
nRBC: 0 % (ref 0.0–0.2)

## 2018-10-20 LAB — COMPREHENSIVE METABOLIC PANEL
ALT: 25 U/L (ref 0–44)
ALT: 24 U/L (ref 0–44)
AST: 27 U/L (ref 15–41)
AST: 27 U/L (ref 15–41)
Albumin: 3.1 g/dL — ABNORMAL LOW (ref 3.5–5.0)
Albumin: 2.9 g/dL — ABNORMAL LOW (ref 3.5–5.0)
Alkaline Phosphatase: 150 U/L — ABNORMAL HIGH (ref 38–126)
Alkaline Phosphatase: 165 U/L — ABNORMAL HIGH (ref 38–126)
Anion gap: 12 (ref 5–15)
Anion gap: 11 (ref 5–15)
BUN: 12 mg/dL (ref 6–20)
BUN: 5 mg/dL — ABNORMAL LOW (ref 6–20)
CO2: 19 mmol/L — ABNORMAL LOW (ref 22–32)
CO2: 20 mmol/L — ABNORMAL LOW (ref 22–32)
Calcium: 9.2 mg/dL (ref 8.9–10.3)
Calcium: 8.1 mg/dL — ABNORMAL LOW (ref 8.9–10.3)
Chloride: 108 mmol/L (ref 98–111)
Chloride: 103 mmol/L (ref 98–111)
Creatinine, Ser: 0.88 mg/dL (ref 0.44–1.00)
Creatinine, Ser: 0.74 mg/dL (ref 0.44–1.00)
GFR calc Af Amer: >60 mL/min (ref >60)
GFR calc Af Amer: >60 mL/min (ref >60)
GFR calc non Af Amer: >60 mL/min (ref >60)
GFR calc non Af Amer: >60 mL/min (ref >60)
Glucose, Bld: 78 mg/dL (ref 70–99)
Glucose, Bld: 92 mg/dL (ref 70–99)
Potassium: 3.9 mmol/L (ref 3.5–5.1)
Potassium: 4.1 mmol/L (ref 3.5–5.1)
Sodium: 139 mmol/L (ref 135–145)
Sodium: 134 mmol/L — ABNORMAL LOW (ref 135–145)
Total Bilirubin: 0.6 mg/dL (ref 0.3–1.2)
Total Bilirubin: 0.6 mg/dL (ref 0.3–1.2)
Total Protein: 7.8 g/dL (ref 6.5–8.1)
Total Protein: 6.8 g/dL (ref 6.5–8.1)

## 2018-10-20 LAB — MAGNESIUM: Magnesium: 5.9 mg/dL — ABNORMAL HIGH (ref 1.7–2.4)

## 2018-10-20 LAB — LACTATE DEHYDROGENASE: LDH: 197 U/L — ABNORMAL HIGH (ref 98–192)

## 2018-10-20 LAB — URIC ACID: Uric Acid, Serum: 5.8 mg/dL (ref 2.5–7.1)

## 2018-10-20 LAB — TYPE AND SCREEN
ABO/RH(D): O POS
Antibody Screen: NEGATIVE

## 2018-10-20 LAB — RPR: RPR Ser Ql: NONREACTIVE

## 2018-10-20 MED ORDER — SOD CITRATE-CITRIC ACID 500-334 MG/5ML PO SOLN: 30.0000 mL | ORAL | Status: DC | PRN

## 2018-10-20 MED ORDER — PRENATAL MULTIVITAMIN CH
1.0000 | ORAL_TABLET | Freq: Every day | ORAL | Status: DC
  Administered 2018-10-21 – 2018-10-23 (×3): 1 via ORAL
  Filled 2018-10-20 (×3): qty 1

## 2018-10-20 MED ORDER — BENZOCAINE-MENTHOL 20-0.5 % EX AERO
1.0000 "application " | INHALATION_SPRAY | CUTANEOUS | Status: DC | PRN
  Administered 2018-10-20: 1 via TOPICAL
  Filled 2018-10-20: qty 56

## 2018-10-20 MED ORDER — LACTATED RINGERS IV SOLN
INTRAVENOUS | Status: DC
  Administered 2018-10-20 (×3): via INTRAVENOUS

## 2018-10-20 MED ORDER — ZOLPIDEM TARTRATE 5 MG PO TABS: 5.0000 mg | ORAL_TABLET | Freq: Every evening | ORAL | Status: DC | PRN

## 2018-10-20 MED ORDER — FENTANYL-BUPIVACAINE-NACL 0.5-0.125-0.9 MG/250ML-% EP SOLN
12.0000 mL/h | EPIDURAL | Status: DC | PRN
  Filled 2018-10-20: qty 250

## 2018-10-20 MED ORDER — TETANUS-DIPHTH-ACELL PERTUSSIS 5-2.5-18.5 LF-MCG/0.5 IM SUSP: 0.5000 mL | Freq: Once | INTRAMUSCULAR | Status: DC

## 2018-10-20 MED ORDER — MISOPROSTOL 25 MCG QUARTER TABLET
25.0000 ug | ORAL_TABLET | ORAL | Status: DC | PRN
  Administered 2018-10-20: 25 ug via VAGINAL
  Filled 2018-10-20 (×4): qty 1

## 2018-10-20 MED ORDER — OXYTOCIN BOLUS FROM INFUSION
500.0000 mL | Freq: Once | INTRAVENOUS | Status: AC
  Administered 2018-10-20: 500 mL via INTRAVENOUS

## 2018-10-20 MED ORDER — SENNOSIDES-DOCUSATE SODIUM 8.6-50 MG PO TABS
2.0000 | ORAL_TABLET | ORAL | Status: DC
  Administered 2018-10-20 – 2018-10-23 (×3): 2 via ORAL
  Filled 2018-10-20 (×3): qty 2

## 2018-10-20 MED ORDER — FLEET ENEMA 7-19 GM/118ML RE ENEM: 1.0000 | ENEMA | RECTAL | Status: DC | PRN

## 2018-10-20 MED ORDER — EPHEDRINE 5 MG/ML INJ
10.0000 mg | INTRAVENOUS | Status: DC | PRN
  Filled 2018-10-20: qty 2

## 2018-10-20 MED ORDER — DIPHENHYDRAMINE HCL 25 MG PO CAPS: 25.0000 mg | ORAL_CAPSULE | Freq: Four times a day (QID) | ORAL | Status: DC | PRN

## 2018-10-20 MED ORDER — ONDANSETRON HCL 4 MG PO TABS: 4.0000 mg | ORAL_TABLET | ORAL | Status: DC | PRN

## 2018-10-20 MED ORDER — WITCH HAZEL-GLYCERIN EX PADS: 1.0000 "application " | MEDICATED_PAD | CUTANEOUS | Status: DC | PRN

## 2018-10-20 MED ORDER — FENTANYL CITRATE (PF) 100 MCG/2ML IJ SOLN
50.0000 ug | INTRAMUSCULAR | Status: DC | PRN
  Administered 2018-10-20: 100 ug via INTRAVENOUS
  Administered 2018-10-20: 50 ug via INTRAVENOUS
  Filled 2018-10-20 (×2): qty 2

## 2018-10-20 MED ORDER — DIPHENHYDRAMINE HCL 50 MG/ML IJ SOLN: 12.5000 mg | INTRAMUSCULAR | Status: DC | PRN

## 2018-10-20 MED ORDER — DIBUCAINE (PERIANAL) 1 % EX OINT: 1.0000 "application " | TOPICAL_OINTMENT | CUTANEOUS | Status: DC | PRN

## 2018-10-20 MED ORDER — ACETAMINOPHEN 325 MG PO TABS
650.0000 mg | ORAL_TABLET | ORAL | Status: DC | PRN
  Administered 2018-10-20: 650 mg via ORAL
  Filled 2018-10-20 (×2): qty 2

## 2018-10-20 MED ORDER — ONDANSETRON HCL 4 MG/2ML IJ SOLN: 4.0000 mg | INTRAMUSCULAR | Status: DC | PRN

## 2018-10-20 MED ORDER — ACETAMINOPHEN 325 MG PO TABS: 650.0000 mg | ORAL_TABLET | ORAL | Status: DC | PRN

## 2018-10-20 MED ORDER — MAGNESIUM SULFATE 40 G IN LACTATED RINGERS - SIMPLE
1.0000 g/h | INTRAVENOUS | Status: DC
  Administered 2018-10-21: 2 g/h via INTRAVENOUS
  Filled 2018-10-20: qty 500

## 2018-10-20 MED ORDER — MAGNESIUM SULFATE BOLUS VIA INFUSION
4.0000 g | Freq: Once | INTRAVENOUS | Status: AC
  Administered 2018-10-20: 4 g via INTRAVENOUS
  Filled 2018-10-20: qty 500

## 2018-10-20 MED ORDER — OXYTOCIN 40 UNITS IN NORMAL SALINE INFUSION - SIMPLE MED: 2.5000 [IU]/h | INTRAVENOUS | Status: DC

## 2018-10-20 MED ORDER — LABETALOL HCL 200 MG PO TABS
200.0000 mg | ORAL_TABLET | Freq: Two times a day (BID) | ORAL | Status: DC
  Administered 2018-10-20 – 2018-10-21 (×3): 200 mg via ORAL
  Filled 2018-10-20 (×3): qty 1

## 2018-10-20 MED ORDER — LABETALOL HCL 5 MG/ML IV SOLN
40.0000 mg | INTRAVENOUS | Status: DC | PRN
  Administered 2018-10-20: 40 mg via INTRAVENOUS
  Filled 2018-10-20: qty 8

## 2018-10-20 MED ORDER — MEDROXYPROGESTERONE ACETATE 150 MG/ML IM SUSP: 150.0000 mg | INTRAMUSCULAR | Status: DC | PRN

## 2018-10-20 MED ORDER — OXYTOCIN 40 UNITS IN NORMAL SALINE INFUSION - SIMPLE MED
1.0000 m[IU]/min | INTRAVENOUS | Status: DC
  Filled 2018-10-20: qty 1000

## 2018-10-20 MED ORDER — LACTATED RINGERS IV SOLN
INTRAVENOUS | Status: DC
  Administered 2018-10-21 (×2): via INTRAVENOUS

## 2018-10-20 MED ORDER — ONDANSETRON HCL 4 MG/2ML IJ SOLN
4.0000 mg | Freq: Four times a day (QID) | INTRAMUSCULAR | Status: DC | PRN
  Administered 2018-10-20: 4 mg via INTRAVENOUS
  Filled 2018-10-20: qty 2

## 2018-10-20 MED ORDER — PHENYLEPHRINE 40 MCG/ML (10ML) SYRINGE FOR IV PUSH (FOR BLOOD PRESSURE SUPPORT)
80.0000 ug | PREFILLED_SYRINGE | INTRAVENOUS | Status: DC | PRN
  Filled 2018-10-20 (×2): qty 10

## 2018-10-20 MED ORDER — TERBUTALINE SULFATE 1 MG/ML IJ SOLN: 0.2500 mg | Freq: Once | INTRAMUSCULAR | Status: DC | PRN

## 2018-10-20 MED ORDER — LIDOCAINE HCL (PF) 1 % IJ SOLN
30.0000 mL | INTRAMUSCULAR | Status: DC | PRN
  Filled 2018-10-20: qty 30

## 2018-10-20 MED ORDER — SIMETHICONE 80 MG PO CHEW: 80.0000 mg | CHEWABLE_TABLET | ORAL | Status: DC | PRN

## 2018-10-20 MED ORDER — COCONUT OIL OIL
1.0000 "application " | TOPICAL_OIL | Status: DC | PRN
  Administered 2018-10-20: 1 via TOPICAL

## 2018-10-20 MED ORDER — PHENYLEPHRINE 40 MCG/ML (10ML) SYRINGE FOR IV PUSH (FOR BLOOD PRESSURE SUPPORT)
80.0000 ug | PREFILLED_SYRINGE | INTRAVENOUS | Status: DC | PRN
  Filled 2018-10-20: qty 10

## 2018-10-20 MED ORDER — LABETALOL HCL 5 MG/ML IV SOLN: 80.0000 mg | INTRAVENOUS | Status: DC | PRN

## 2018-10-20 MED ORDER — MAGNESIUM SULFATE 40 G IN LACTATED RINGERS - SIMPLE
2.0000 g/h | INTRAVENOUS | Status: DC
  Filled 2018-10-20: qty 500

## 2018-10-20 MED ORDER — LACTATED RINGERS IV SOLN: 500.0000 mL | INTRAVENOUS | Status: DC | PRN

## 2018-10-20 MED ORDER — IBUPROFEN 600 MG PO TABS
600.0000 mg | ORAL_TABLET | Freq: Four times a day (QID) | ORAL | Status: DC
  Administered 2018-10-20 – 2018-10-23 (×10): 600 mg via ORAL
  Filled 2018-10-20 (×10): qty 1

## 2018-10-20 MED ORDER — HYDRALAZINE HCL 20 MG/ML IJ SOLN: 10.0000 mg | INTRAMUSCULAR | Status: DC | PRN

## 2018-10-20 MED ORDER — LACTATED RINGERS IV SOLN: 500.0000 mL | Freq: Once | INTRAVENOUS | Status: DC

## 2018-10-20 MED ORDER — OXYTOCIN 40 UNITS IN NORMAL SALINE INFUSION - SIMPLE MED
1.0000 m[IU]/min | INTRAVENOUS | Status: DC
  Administered 2018-10-20: 2 m[IU]/min via INTRAVENOUS

## 2018-10-20 MED ORDER — LABETALOL HCL 5 MG/ML IV SOLN
20.0000 mg | INTRAVENOUS | Status: DC | PRN
  Administered 2018-10-20: 20 mg via INTRAVENOUS
  Filled 2018-10-20: qty 4

## 2018-10-20 NOTE — Progress Notes (Signed)
Meredith Velez is a 33 y.o. G2P1001 at [redacted]w[redacted]d admitted for induction of labor due to Pre-eclamptic toxemia of pregnancy..  Subjective: Patient reports a 3/10 headache this morning and is to be given tylenol. She had two severe range blood pressures that required IV labetalol. She denies visual changes or RUQ pain.   Will consult Dr. Sallye Ober this morning as patient meets criteria for preeclampsia with severe features. Anticipate starting MgSO4 per Dr. Sallye Ober.   Objective: Vitals:   10/20/18 0614 10/20/18 0615 10/20/18 0630 10/20/18 0645  BP:  (!) 160/117 (!) 154/106 (!) 157/109  Pulse:  (!) 106 95 90  Temp: 97.8 F (36.6 C)     TempSrc: Oral     Weight:      Height:       FHT:  FHR: 130s bpm, variability: moderate,  accelerations:  Present,  decelerations:  Absent UC:   irregular, every 2-4 minutes SVE:   Dilation: Closed Exam by:: Janice Coffin RN  Labs: Lab Results  Component Value Date   WBC 12.1 (H) 10/20/2018   HGB 11.0 (L) 10/20/2018   HCT 34.5 (L) 10/20/2018   MCV 89.8 10/20/2018   PLT 344 10/20/2018    Assessment / Plan: Induction of labor due to preeclampsia, progressing on cytotec  Labor: Progressing normally Preeclampsia:  no signs or symptoms of toxicity, intake and ouput balanced and labs stable Fetal Wellbeing:  Category I Pain Control:  Labor support without medications I/D:  n/a Anticipated MOD:  NSVD  Meredith Velez 10/20/2018, 6:54 AM

## 2018-10-20 NOTE — Progress Notes (Signed)
Patient ID: Meredith Velez, female   DOB: June 07, 1986, 33 y.o.   MRN: [id number]  Pt denies headache or blurred vision or RUQpain BP (!) 153/100   Pulse 75   Temp 97.8 F (36.6 C) (Oral)   Resp 18   Ht 5\' 2"  (1.575 m)   Wt 86.3 kg   BMI 34.81 kg/m   NST cat 1 toco q2 -10 minutes Continue magnesium Foley bulb placed and and came out but still 2 cm.   Continue pitocin Anticipate SVD

## 2018-10-21 ENCOUNTER — Inpatient Hospital Stay (HOSPITAL_COMMUNITY): Payer: 59

## 2018-10-21 ENCOUNTER — Encounter (HOSPITAL_COMMUNITY): Payer: Self-pay | Admitting: *Deleted

## 2018-10-21 LAB — CBC WITH DIFFERENTIAL/PLATELET
Abs Immature Granulocytes: 0.06 10*3/uL (ref 0.00–0.07)
Basophils Absolute: 0 10*3/uL (ref 0.0–0.1)
Basophils Relative: 0 %
Eosinophils Absolute: 0.1 10*3/uL (ref 0.0–0.5)
Eosinophils Relative: 0 %
HCT: 25.3 % — ABNORMAL LOW (ref 36.0–46.0)
Hemoglobin: 8 g/dL — ABNORMAL LOW (ref 12.0–15.0)
Immature Granulocytes: 1 %
Lymphocytes Relative: 21 %
Lymphs Abs: 2.7 10*3/uL (ref 0.7–4.0)
MCH: 28 pg (ref 26.0–34.0)
MCHC: 31.6 g/dL (ref 30.0–36.0)
MCV: 88.5 fL (ref 80.0–100.0)
Monocytes Absolute: 0.7 10*3/uL (ref 0.1–1.0)
Monocytes Relative: 5 %
Neutro Abs: 9.5 10*3/uL — ABNORMAL HIGH (ref 1.7–7.7)
Neutrophils Relative %: 73 %
Platelets: 283 10*3/uL (ref 150–400)
RBC: 2.86 MIL/uL — ABNORMAL LOW (ref 3.87–5.11)
RDW: 16.7 % — ABNORMAL HIGH (ref 11.5–15.5)
WBC: 12.9 10*3/uL — ABNORMAL HIGH (ref 4.0–10.5)
nRBC: 0 % (ref 0.0–0.2)

## 2018-10-21 LAB — URIC ACID: Uric Acid, Serum: 6.5 mg/dL (ref 2.5–7.1)

## 2018-10-21 LAB — CBC
HCT: 28.3 % — ABNORMAL LOW (ref 36.0–46.0)
Hemoglobin: 8.7 g/dL — ABNORMAL LOW (ref 12.0–15.0)
MCH: 27.5 pg (ref 26.0–34.0)
MCHC: 30.7 g/dL (ref 30.0–36.0)
MCV: 89.6 fL (ref 80.0–100.0)
Platelets: 306 10*3/uL (ref 150–400)
RBC: 3.16 MIL/uL — ABNORMAL LOW (ref 3.87–5.11)
RDW: 16.6 % — ABNORMAL HIGH (ref 11.5–15.5)
WBC: 15.8 10*3/uL — ABNORMAL HIGH (ref 4.0–10.5)
nRBC: 0 % (ref 0.0–0.2)

## 2018-10-21 LAB — COMPREHENSIVE METABOLIC PANEL
ALT: 22 U/L (ref 0–44)
AST: 24 U/L (ref 15–41)
Albumin: 2.3 g/dL — ABNORMAL LOW (ref 3.5–5.0)
Alkaline Phosphatase: 115 U/L (ref 38–126)
Anion gap: 9 (ref 5–15)
BUN: 12 mg/dL (ref 6–20)
CO2: 21 mmol/L — ABNORMAL LOW (ref 22–32)
Calcium: 7.3 mg/dL — ABNORMAL LOW (ref 8.9–10.3)
Chloride: 106 mmol/L (ref 98–111)
Creatinine, Ser: 0.99 mg/dL (ref 0.44–1.00)
GFR calc Af Amer: >60 mL/min (ref >60)
GFR calc non Af Amer: >60 mL/min (ref >60)
Glucose, Bld: 121 mg/dL — ABNORMAL HIGH (ref 70–99)
Potassium: 4 mmol/L (ref 3.5–5.1)
Sodium: 136 mmol/L (ref 135–145)
Total Bilirubin: 0.4 mg/dL (ref 0.3–1.2)
Total Protein: 5.4 g/dL — ABNORMAL LOW (ref 6.5–8.1)

## 2018-10-21 LAB — MAGNESIUM
Magnesium: 7.7 mg/dL (ref 1.7–2.4)
Magnesium: 6.2 mg/dL (ref 1.7–2.4)

## 2018-10-21 LAB — LACTATE DEHYDROGENASE: LDH: 172 U/L (ref 98–192)

## 2018-10-21 MED ORDER — FERROUS SULFATE 325 (65 FE) MG PO TABS
325.0000 mg | ORAL_TABLET | Freq: Two times a day (BID) | ORAL | Status: DC
  Administered 2018-10-21 – 2018-10-22 (×3): 325 mg via ORAL
  Filled 2018-10-21 (×3): qty 1

## 2018-10-21 MED ORDER — SALINE SPRAY 0.65 % NA SOLN
1.0000 | NASAL | Status: DC | PRN
  Administered 2018-10-21: 1 via NASAL
  Filled 2018-10-21: qty 44

## 2018-10-21 NOTE — Progress Notes (Addendum)
Subjective: Postpartum Day # 1 : S/P NSVD due to IOL for chronic hypertension with superimposed preeclampsia with severe features due to elevated severe range Bps and IUGR at 7%ile. EFW 5lbs2oz. Patient up ad lib, denies syncope or dizziness. Reports consuming regular diet without issues and denies N/V. Patient reports 0 bowel movement + passing flatus.  Denies issues with urination and reports bleeding is "lighter."  Patient is breast and bottle feeding and reports going well.  Desires PP tubaligation for postpartum contraception.  Pain is being appropriately managed with use of po meds. Pt denies HA, vision changes nor RUQ pain. Pt appears fatigued, RN report pt legs very weak, request magnesium level to be drawn. Pt on magnesium for 24 hour PP, to be turned off at 1900 tonight.   NO laceration Feeding:  Breast/bottle Contraceptive plan:  PP tubal desired BB: Circ In pt circ desired   Objective: Vital signs in last 24 hours: Patient Vitals for the past 24 hrs:  BP Temp Temp src Pulse Resp SpO2  10/21/18 0800 120/86 (!) 97.4 F (36.3 C) Oral 85 18 100 %  10/21/18 0755 129/86 - - 83 - -  10/21/18 0501 - - - - 18 -  10/21/18 0400 - - - - 16 -  10/21/18 0306 (!) 105/56 98 F (36.7 C) Oral 76 17 -  10/21/18 0158 - - - - 16 -  10/21/18 0105 - - - - 14 -  10/21/18 0000 - - - - 16 -  10/20/18 2306 135/88 (!) 97.5 F (36.4 C) Oral 86 17 99 %  10/20/18 2300 - - - - 17 -  10/20/18 2200 102/65 - - 90 16 100 %  10/20/18 2150 (!) 144/97 (!) 97.5 F (36.4 C) Oral 86 16 99 %  10/20/18 2045 (!) 151/101 - - 88 - -  10/20/18 2040 (!) 168/103 - - 85 - -  10/20/18 2015 (!) 158/104 - - 86 - -  10/20/18 2000 (!) 161/110 - - 97 - -  10/20/18 1952 - (!) 97.3 F (36.3 C) Axillary - - -  10/20/18 1945 (!) 158/108 - - 83 18 -  10/20/18 1932 (!) 153/107 - - 89 - -  10/20/18 1922 (!) 148/102 - - 88 18 -  10/20/18 1919 (!) 161/74 - - 93 - -  10/20/18 1911 (!) 162/86 - - 92 - -  10/20/18 1907 (!) 165/104 -  - 90 (!) 22 -  10/20/18 1901 (!) 158/96 - - 93 20 -  10/20/18 1856 (!) 146/94 - - 87 18 -  10/20/18 1850 (!) 156/106 - - 90 - 99 %  10/20/18 1846 (!) 136/100 - - 86 20 -  10/20/18 1845 - - - - - 100 %  10/20/18 1841 (!) 147/83 - - 88 18 -  10/20/18 1840 - - - - - 100 %  10/20/18 1836 (!) 150/99 - - 79 20 -  10/20/18 1835 - - - - - 100 %  10/20/18 1831 (!) 155/95 - - 84 20 -  10/20/18 1830 - - - - - 100 %  10/20/18 1826 (!) 156/102 - - 80 18 -  10/20/18 1825 - - - - - 100 %  10/20/18 1821 (!) 143/101 - - 79 18 -  10/20/18 1820 - - - - - 100 %  10/20/18 1816 (!) 156/100 - - 70 20 -  10/20/18 1815 - - - - - 100 %  10/20/18 1814 123/87 - - 79 20 -  10/20/18 1811 99/71 - - 85 20 -  10/20/18 1810 - - - - - 99 %  10/20/18 1801 - - - - 20 -  10/20/18 1731 (!) 158/106 - - 77 20 -  10/20/18 1701 (!) 139/91 97.6 F (36.4 C) Oral 74 18 -  10/20/18 1631 138/90 - - 75 18 -  10/20/18 1601 (!) 150/94 - - 77 18 -  10/20/18 1531 (!) 149/87 - - 82 18 -  10/20/18 1525 (!) 153/100 - - 75 - -  10/20/18 1431 (!) 147/99 - - 73 - -  10/20/18 1401 (!) 146/92 - - 83 - -  10/20/18 1331 (!) 149/101 - - 80 - -  10/20/18 1318 (!) 144/97 - - 75 - -  10/20/18 1301 (!) 165/103 - - 95 - -  10/20/18 1231 (!) 139/102 - - 85 - -  10/20/18 1131 (!) 136/94 - - 84 - -  10/20/18 1101 (!) 148/101 - - 88 - -  10/20/18 1052 (!) 150/95 - - 86 - -  10/20/18 1042 (!) 160/102 - - 91 - -  10/20/18 1031 (!) 160/108 - - 87 - -     Physical Exam:  General: alert, cooperative, appears stated age, fatigued and no distress Mood/Affect: Happy Lungs: clear to auscultation, no wheezes, rales or rhonchi, symmetric air entry.  Heart: normal rate, regular rhythm, normal S1, S2, no murmurs, rubs, clicks or gallops. Breast: breasts appear normal, no suspicious masses, no skin or nipple changes or axillary nodes. Abdomen:  + bowel sounds, soft, non-tender GU: perineum Intact, healing well. No signs of external hematomas.  Uterine  Fundus: firm Lochia: appropriate Skin: Warm, Dry. DVT Evaluation: No evidence of DVT seen on physical exam. Negative Homan's sign. No cords or calf tenderness. No significant calf/ankle edema. No clonus, 1+ Patellar DTR.   CBC Latest Ref Rng & Units 10/21/2018 10/20/2018 10/20/2018  WBC 4.0 - 10.5 K/uL 15.8(H) 13.1(H) 12.1(H)  Hemoglobin 12.0 - 15.0 g/dL 3.5(T) 11.5(L) 11.0(L)  Hematocrit 36.0 - 46.0 % 28.3(L) 36.6 34.5(L)  Platelets 150 - 400 K/uL 306 342 344    Results for orders placed or performed during the hospital encounter of 10/20/18 (from the past 24 hour(s))  CBC     Status: Abnormal   Collection Time: 10/20/18  5:38 PM  Result Value Ref Range   WBC 13.1 (H) 4.0 - 10.5 K/uL   RBC 4.15 3.87 - 5.11 MIL/uL   Hemoglobin 11.5 (L) 12.0 - 15.0 g/dL   HCT 73.2 20.2 - 54.2 %   MCV 88.2 80.0 - 100.0 fL   MCH 27.7 26.0 - 34.0 pg   MCHC 31.4 30.0 - 36.0 g/dL   RDW 70.6 (H) 23.7 - 62.8 %   Platelets 342 150 - 400 K/uL   nRBC 0.0 0.0 - 0.2 %  Magnesium     Status: Abnormal   Collection Time: 10/20/18  6:14 PM  Result Value Ref Range   Magnesium 5.9 (H) 1.7 - 2.4 mg/dL  Comprehensive metabolic panel     Status: Abnormal   Collection Time: 10/20/18  6:14 PM  Result Value Ref Range   Sodium 134 (L) 135 - 145 mmol/L   Potassium 4.1 3.5 - 5.1 mmol/L   Chloride 103 98 - 111 mmol/L   CO2 20 (L) 22 - 32 mmol/L   Glucose, Bld 92 70 - 99 mg/dL   BUN 5 (L) 6 - 20 mg/dL   Creatinine, Ser 3.15 0.44 - 1.00 mg/dL  Calcium 8.1 (L) 8.9 - 10.3 mg/dL   Total Protein 6.8 6.5 - 8.1 g/dL   Albumin 2.9 (L) 3.5 - 5.0 g/dL   AST 27 15 - 41 U/L   ALT 24 0 - 44 U/L   Alkaline Phosphatase 165 (H) 38 - 126 U/L   Total Bilirubin 0.6 0.3 - 1.2 mg/dL   GFR calc non Af Amer >60 >60 mL/min   GFR calc Af Amer >60 >60 mL/min   Anion gap 11 5 - 15  CBC     Status: Abnormal   Collection Time: 10/21/18  5:29 AM  Result Value Ref Range   WBC 15.8 (H) 4.0 - 10.5 K/uL   RBC 3.16 (L) 3.87 - 5.11 MIL/uL    Hemoglobin 8.7 (L) 12.0 - 15.0 g/dL   HCT 88.2 (L) 80.0 - 34.9 %   MCV 89.6 80.0 - 100.0 fL   MCH 27.5 26.0 - 34.0 pg   MCHC 30.7 30.0 - 36.0 g/dL   RDW 17.9 (H) 15.0 - 56.9 %   Platelets 306 150 - 400 K/uL   nRBC 0.0 0.0 - 0.2 %     CBG (last 3)  No results for input(s): GLUCAP in the last 72 hours.   I/O last 3 completed shifts: In: 4538.7 [P.O.:1800; I.V.:2738.7] Out: 2221 [Urine:2025; Blood:196]   Assessment Postpartum Day # 1 : S/P NSVD due to IOL for chronic hypertension with superimposed preeclampsia with severe features due to elevated severe range Bps and IUGR at 7%ile. EFW 5lbs2oz.. Pt stable. -1 involution. Breast and bottle feeding. Hemodynamically stable hgb drop from 11.5 to 8.7 PP, pt fatigued, denies other s/sx. Pt BP 120/86 on labetalol 200mg  BID, denies s/sx on mag PP currently, pt fatigued and 1+DTR, pending mag level now. I&O: 48ml/hr over last 10 hours.    Plan: Continue other mgmt as ordered Anemia: Start Iron BID PO CHTN with Superimposed PreE with SF: Continue mag, until 1900 tonight. RN to continue nuero check. Monitor BP, continue to take labetalol 200mg  BID, hold if BP <120/80. Pending mag level, if elevated plan to decrease mag to 1gm/hr, if therapeutic maintained 2gm/hr VTE prophylactics: Early ambulated as tolerates.  Pain control: Motrin/Tylenol PRN Plan for discharge tomorrow, Breastfeeding, Lactation consult, Circumcision prior to discharge and Contraception Desires PP tubal.   Dr. Mora Appl to be updated on patient status Dr Mora Appl to perform circ tomorrow.   [facility] NP-C, CNM 10/21/2018, 10:04 AM   Addendum 11:12 AM Call by RN Magnesium level 7.7,, although that is therapeutic for preeclampsia, pt is symptomatic with RN reporting loss of DTR, magnesium decreased to 1gm/hr, will d/c mag @ 2100 tonight. RN to continue to report absent DTR and neuro exams, Dr Mora Appl aware pt status and agrees with POC.  [facility], CNM, NP-C

## 2018-10-21 NOTE — Progress Notes (Signed)
Subjective: Postpartum Day # 1 : S/P NSVD due to IOL for chronic hypertension with superimposed preeclampsia with severe features due to elevated severe range Bps and IUGR at 7%ile. EFW 5lbs2oz.    12:37 RN called and reported pt weak, fatigued and unable to stand allow, RN impression is magnesium toxicity. RN informed to stop Magnesium.  12:39 I enter room, pt was standing next to her bed about to walk to the bathroom CNA and RN holding pt up, pt responds, pt appeared stable bu fatigued, pt was sitting on toilet urinated , pt appeared to have trouble staying awake, attempted to assess pts LOC, pt was unable to tell her her name, no slurred or facial deficits noted, pt then was helped back to the bed and pt legs were going out on her.   12:44 On phone consulted with Dr Mora Appl. Dr Mora Appl aware of decreased LOC and stopped magnesium. Dr Mora Appl verbalized to stop magnesium and obtain stat Head CT without contract to check for brain bleed. Dr Mora Appl declined to check EKG and troponin at this time, Dr Mora Appl stated no indicated at this time. Dr Mora Appl verbalized she would come see the pt.   12:50 Head CT without contrast order with pree labs.  12:52 I did another assessment on pt, pt stable now, found to have 2+patelar reflexes, clear lungs, al;ert to person place and time. Pt still appeared fatigued, but stable. Dr Mora Appl aware.    Objective: Vital signs in last 24 hours: Patient Vitals for the past 24 hrs:  BP Temp Temp src Pulse Resp SpO2  10/21/18 1200 117/74 97.8 F (36.6 C) Oral 88 18 100 %  10/21/18 1130 117/74 - - 87 - -  10/21/18 0800 120/86 (!) 97.4 F (36.3 C) Oral 85 18 100 %  10/21/18 0755 129/86 - - 83 - -  10/21/18 0501 - - - - 18 -  10/21/18 0400 - - - - 16 -  10/21/18 0306 (!) 105/56 98 F (36.7 C) Oral 76 17 -  10/21/18 0158 - - - - 16 -  10/21/18 0105 - - - - 14 -  10/21/18 0000 - - - - 16 -  10/20/18 2306 135/88 (!) 97.5 F (36.4 C) Oral 86 17 99 %  10/20/18 2300 - - - - 17 -   10/20/18 2200 102/65 - - 90 16 100 %  10/20/18 2150 (!) 144/97 (!) 97.5 F (36.4 C) Oral 86 16 99 %  10/20/18 2045 (!) 151/101 - - 88 - -  10/20/18 2040 (!) 168/103 - - 85 - -  10/20/18 2015 (!) 158/104 - - 86 - -  10/20/18 2000 (!) 161/110 - - 97 - -  10/20/18 1952 - (!) 97.3 F (36.3 C) Axillary - - -  10/20/18 1945 (!) 158/108 - - 83 18 -  10/20/18 1932 (!) 153/107 - - 89 - -  10/20/18 1922 (!) 148/102 - - 88 18 -  10/20/18 1919 (!) 161/74 - - 93 - -  10/20/18 1911 (!) 162/86 - - 92 - -  10/20/18 1907 (!) 165/104 - - 90 (!) 22 -  10/20/18 1901 (!) 158/96 - - 93 20 -  10/20/18 1856 (!) 146/94 - - 87 18 -  10/20/18 1850 (!) 156/106 - - 90 - 99 %  10/20/18 1846 (!) 136/100 - - 86 20 -  10/20/18 1845 - - - - - 100 %  10/20/18 1841 (!) 147/83 - - 88 18 -  10/20/18  1840 - - - - - 100 %  10/20/18 1836 (!) 150/99 - - 79 20 -  10/20/18 1835 - - - - - 100 %  10/20/18 1831 (!) 155/95 - - 84 20 -  10/20/18 1830 - - - - - 100 %  10/20/18 1826 (!) 156/102 - - 80 18 -  10/20/18 1825 - - - - - 100 %  10/20/18 1821 (!) 143/101 - - 79 18 -  10/20/18 1820 - - - - - 100 %  10/20/18 1816 (!) 156/100 - - 70 20 -  10/20/18 1815 - - - - - 100 %  10/20/18 1814 123/87 - - 79 20 -  10/20/18 1811 99/71 - - 85 20 -  10/20/18 1810 - - - - - 99 %  10/20/18 1801 - - - - 20 -  10/20/18 1731 (!) 158/106 - - 77 20 -  10/20/18 1701 (!) 139/91 97.6 F (36.4 C) Oral 74 18 -  10/20/18 1631 138/90 - - 75 18 -  10/20/18 1601 (!) 150/94 - - 77 18 -  10/20/18 1531 (!) 149/87 - - 82 18 -  10/20/18 1525 (!) 153/100 - - 75 - -  10/20/18 1431 (!) 147/99 - - 73 - -     Physical Exam:  General: fatigued and slowed mentation Mood/Affect: flat Neuro: pt smile equal, stretch with hand grip equal, , PERRLA, pt follows commands, had trouble saying her own name, pt stuttered.  Lungs: clear to auscultation, no wheezes, rales or rhonchi, symmetric air entry.  Heart: normal rate, regular rhythm, normal S1, S2, no  murmurs, rubs, clicks or gallops. Breast: breasts appear normal, no suspicious masses, no skin or nipple changes or axillary nodes. Abdomen:  + bowel sounds, soft, non-tender GU: perineum Intact, healing well. No signs of external hematomas.  Uterine Fundus: firm Lochia: appropriate Skin: Warm, Dry. DVT Evaluation: No evidence of DVT seen on physical exam. Negative Homan's sign. No cords or calf tenderness. No significant calf/ankle edema. No clonus, 2+ Patellar DTR.   CBC Latest Ref Rng & Units 10/21/2018 10/21/2018 10/20/2018  WBC 4.0 - 10.5 K/uL 12.9(H) 15.8(H) 13.1(H)  Hemoglobin 12.0 - 15.0 g/dL 8.0(L) 8.7(L) 11.5(L)  Hematocrit 36.0 - 46.0 % 25.3(L) 28.3(L) 36.6  Platelets 150 - 400 K/uL 283 306 342    Results for orders placed or performed during the hospital encounter of 10/20/18 (from the past 24 hour(s))  CBC     Status: Abnormal   Collection Time: 10/20/18  5:38 PM  Result Value Ref Range   WBC 13.1 (H) 4.0 - 10.5 K/uL   RBC 4.15 3.87 - 5.11 MIL/uL   Hemoglobin 11.5 (L) 12.0 - 15.0 g/dL   HCT 16.1 09.6 - 04.5 %   MCV 88.2 80.0 - 100.0 fL   MCH 27.7 26.0 - 34.0 pg   MCHC 31.4 30.0 - 36.0 g/dL   RDW 40.9 (H) 81.1 - 91.4 %   Platelets 342 150 - 400 K/uL   nRBC 0.0 0.0 - 0.2 %  Magnesium     Status: Abnormal   Collection Time: 10/20/18  6:14 PM  Result Value Ref Range   Magnesium 5.9 (H) 1.7 - 2.4 mg/dL  Comprehensive metabolic panel     Status: Abnormal   Collection Time: 10/20/18  6:14 PM  Result Value Ref Range   Sodium 134 (L) 135 - 145 mmol/L   Potassium 4.1 3.5 - 5.1 mmol/L   Chloride 103 98 - 111 mmol/L  CO2 20 (L) 22 - 32 mmol/L   Glucose, Bld 92 70 - 99 mg/dL   BUN 5 (L) 6 - 20 mg/dL   Creatinine, Ser 3.09 0.44 - 1.00 mg/dL   Calcium 8.1 (L) 8.9 - 10.3 mg/dL   Total Protein 6.8 6.5 - 8.1 g/dL   Albumin 2.9 (L) 3.5 - 5.0 g/dL   AST 27 15 - 41 U/L   ALT 24 0 - 44 U/L   Alkaline Phosphatase 165 (H) 38 - 126 U/L   Total Bilirubin 0.6 0.3 - 1.2 mg/dL    GFR calc non Af Amer >60 >60 mL/min   GFR calc Af Amer >60 >60 mL/min   Anion gap 11 5 - 15  CBC     Status: Abnormal   Collection Time: 10/21/18  5:29 AM  Result Value Ref Range   WBC 15.8 (H) 4.0 - 10.5 K/uL   RBC 3.16 (L) 3.87 - 5.11 MIL/uL   Hemoglobin 8.7 (L) 12.0 - 15.0 g/dL   HCT 40.7 (L) 68.0 - 88.1 %   MCV 89.6 80.0 - 100.0 fL   MCH 27.5 26.0 - 34.0 pg   MCHC 30.7 30.0 - 36.0 g/dL   RDW 10.3 (H) 15.9 - 45.8 %   Platelets 306 150 - 400 K/uL   nRBC 0.0 0.0 - 0.2 %  Magnesium     Status: Abnormal   Collection Time: 10/21/18  8:30 AM  Result Value Ref Range   Magnesium 7.7 (HH) 1.7 - 2.4 mg/dL  Comprehensive metabolic panel     Status: Abnormal   Collection Time: 10/21/18  1:24 PM  Result Value Ref Range   Sodium 136 135 - 145 mmol/L   Potassium 4.0 3.5 - 5.1 mmol/L   Chloride 106 98 - 111 mmol/L   CO2 21 (L) 22 - 32 mmol/L   Glucose, Bld 121 (H) 70 - 99 mg/dL   BUN 12 6 - 20 mg/dL   Creatinine, Ser 5.92 0.44 - 1.00 mg/dL   Calcium 7.3 (L) 8.9 - 10.3 mg/dL   Total Protein 5.4 (L) 6.5 - 8.1 g/dL   Albumin 2.3 (L) 3.5 - 5.0 g/dL   AST 24 15 - 41 U/L   ALT 22 0 - 44 U/L   Alkaline Phosphatase 115 38 - 126 U/L   Total Bilirubin 0.4 0.3 - 1.2 mg/dL   GFR calc non Af Amer >60 >60 mL/min   GFR calc Af Amer >60 >60 mL/min   Anion gap 9 5 - 15  Magnesium     Status: Abnormal   Collection Time: 10/21/18  1:24 PM  Result Value Ref Range   Magnesium 6.2 (HH) 1.7 - 2.4 mg/dL  CBC with Differential/Platelet     Status: Abnormal   Collection Time: 10/21/18  1:24 PM  Result Value Ref Range   WBC 12.9 (H) 4.0 - 10.5 K/uL   RBC 2.86 (L) 3.87 - 5.11 MIL/uL   Hemoglobin 8.0 (L) 12.0 - 15.0 g/dL   HCT 92.4 (L) 46.2 - 86.3 %   MCV 88.5 80.0 - 100.0 fL   MCH 28.0 26.0 - 34.0 pg   MCHC 31.6 30.0 - 36.0 g/dL   RDW 81.7 (H) 71.1 - 65.7 %   Platelets 283 150 - 400 K/uL   nRBC 0.0 0.0 - 0.2 %   Neutrophils Relative % 73 %   Neutro Abs 9.5 (H) 1.7 - 7.7 K/uL   Lymphocytes Relative  21 %   Lymphs Abs 2.7 0.7 -  4.0 K/uL   Monocytes Relative 5 %   Monocytes Absolute 0.7 0.1 - 1.0 K/uL   Eosinophils Relative 0 %   Eosinophils Absolute 0.1 0.0 - 0.5 K/uL   Basophils Relative 0 %   Basophils Absolute 0.0 0.0 - 0.1 K/uL   Immature Granulocytes 1 %   Abs Immature Granulocytes 0.06 0.00 - 0.07 K/uL  Uric acid     Status: None   Collection Time: 10/21/18  1:24 PM  Result Value Ref Range   Uric Acid, Serum 6.5 2.5 - 7.1 mg/dL  Lactate dehydrogenase     Status: None   Collection Time: 10/21/18  1:24 PM  Result Value Ref Range   LDH 172 98 - 192 U/L    Ct Head Wo Contrast  Result Date: 10/21/2018 CLINICAL DATA:  Headache postpartum with decreased level of consciousness EXAM: CT HEAD WITHOUT CONTRAST TECHNIQUE: Contiguous axial images were obtained from the base of the skull through the vertex without intravenous contrast. COMPARISON:  None. FINDINGS: Brain: The ventricles are normal in size and configuration. There is no intracranial mass, hemorrhage, extra-axial fluid collection, or midline shift. The brain parenchyma appears unremarkable. No evident acute infarct. Vascular: There is no hyperdense vessel. There is no appreciable vascular calcification. Skull: The bony calvarium appears intact. Sinuses/Orbits: There is a retention cyst in the medial left sphenoid sinus. There is mucosal thickening in several ethmoid air cells. Orbits appear symmetric bilaterally. Other: Mastoid air cells are clear. There are foci of debris in each external auditory canal. IMPRESSION: Brain parenchyma appears normal.  No mass or hemorrhage. Foci of paranasal sinus disease. Probable cerumen in each external auditory canal. Electronically Signed   By: Bretta Bang III M.D.   On: 10/21/2018 13:48   [place] Mfm Fetal Bpp Wo Non Stress  Result Date: 09/29/2018 ----------------------------------------------------------------------  OBSTETRICS REPORT                       (Signed Final 09/29/2018 11:11 am)  ---------------------------------------------------------------------- Patient Info  ID #:       [id number]                          D.O.B.:  30-Nov-1985 (32 yrs)  Name:       Meredith Velez             Visit Date: 09/29/2018 09:45 am ---------------------------------------------------------------------- Performed By  Performed By:     Percell Boston          Ref. Address:     [address]                    [address]                                                             [address]                                                             [place]  [address]  Attending:        Noralee Space MD        Secondary Phy.:   [facility]                                                             [facility]  Referred By:      Jonette Pesa           [facility] ---------------------------------------------------------------------- Orders   #  Description                          Code         Ordered By   1  [place] MFM FETAL BPP WO NON              76819.01     ELLIS GREER      STRESS   2  [place] MFM OB FOLLOW UP                  06004.59     Kathalene Frames  ----------------------------------------------------------------------   #  Order #                    Accession #                 Episode #   1  [id number]                  [id number]                  [id number]   2  [id number]                  [id number]                  [id number]  ---------------------------------------------------------------------- Indications   [redacted] weeks gestation of pregnancy                Z3A.33   Hypertension - Chronic/Pre-existing            O10.019   Encounter for other antenatal screening        Z36.2   follow-up (low risk NIPS)  ---------------------------------------------------------------------- Vital Signs                                                 Height:        5'2"  ---------------------------------------------------------------------- Fetal Evaluation  Num Of Fetuses:         1  Fetal Heart Rate(bpm):  157  Cardiac Activity:       Observed  Presentation:           Cephalic  Placenta:  Anterior  P. Cord Insertion:      Visualized, central  Amniotic Fluid  AFI FV:      Within normal limits  AFI Sum(cm)     %Tile       Largest Pocket(cm)  11.01           26          4.31  RUQ(cm)       RLQ(cm)       LUQ(cm)        LLQ(cm)  2.09          4.31          2.14           2.47 ---------------------------------------------------------------------- Biophysical Evaluation  Amniotic F.V:   Within normal limits       F. Tone:        Observed  F. Movement:    Observed                   Score:          8/8  F. Breathing:   Observed ---------------------------------------------------------------------- Biometry  BPD:      76.7  mm     G. Age:  30w 5d        < 1  %    CI:        72.59   %    70 - 86                                                          FL/HC:      21.7   %    19.4 - 21.8  HC:      286.3  mm     G. Age:  31w 3d        < 3  %    HC/AC:      1.03        0.96 - 1.11  AC:      276.7  mm     G. Age:  31w 5d          9  %    FL/BPD:     80.8   %    71 - 87  FL:         62  mm     G. Age:  32w 1d         10  %    FL/AC:      22.4   %    20 - 24  HUM:      55.8  mm     G. Age:  32w 3d         35  %  Est. FW:    1829  gm      4 lb 1 oz     25  % ---------------------------------------------------------------------- OB History  Gravidity:    2         Term:   1        Prem:   0        SAB:   0  TOP:          0       Ectopic:  0        Living: 0 ---------------------------------------------------------------------- Gestational Age  U/S Today:     31w 4d                                        EDD:   11/27/18  Best:          33w 4d     Det. By:  Marcella Dubs         EDD:   11/13/18 ---------------------------------------------------------------------- Anatomy  Cranium:                Appears normal         LVOT:                   Previously seen  Cavum:                 Previously seen        Aortic Arch:            Previously seen  Ventricles:            Appears normal         Ductal Arch:            Previously seen  Choroid Plexus:        Previously seen        Diaphragm:              Previously seen  Cerebellum:            Previously seen        Stomach:                Appears normal, left                                                                        sided  Posterior Fossa:       Previously seen        Abdomen:                Previously seen  Nuchal Fold:           Previously seen        Abdominal Wall:         Previously seen  Face:                  Orbits and profile     Cord Vessels:           Previously seen                         previously seen  Lips:                  Previously seen        Kidneys:                Appear normal  Palate:                Not well visualized    Bladder:                Appears normal  Thoracic:  Appears normal         Spine:                  Previously seen  Heart:                 Previously seen        Upper Extremities:      Previously seen  RVOT:                  Previously seen        Lower Extremities:      Previously seen  Other:  Fetus appears to be a female. Heels visualized previously. Nasal bone          visualized previously. ---------------------------------------------------------------------- Cervix Uterus Adnexa  Cervix  Not visualized (advanced GA >24wks) ---------------------------------------------------------------------- Impression  Ms. Lysaght, G2 P1 at 33w 4d gestation, was admitted yesterday  with severe range blood pressures. She has chronic  hypertension that was diagnosed in 2014 after her previous  childbirth. Patient has been taking metoprolol before  pregnancy.  Since admission, her blood pressures have improved and are  in the normal to mild hypertensive range. I spoke with her on  phone after  ultrasound.  She does not have symptoms of severe headache or visual  disturbances or right upper quadrant pain.  Obstetric history is significant for a term vaginal delivery. Her  pregnancy was complicated by gestational hypertension.  Labs including liver enzymes, creatinine and platelets are  normal. No increased proteinuria is seen.  On ultrasound, fetal growth is appropriate for gestational age.  Amniotic fluid is normal and good fetal activity is seen.  Antenatal testing is reassuring. BPP 8/8.  NST performed in the AP unit has been reassuring.  I reassured the patient of normal fetal growth. I discussed the  safey and importance of taking Procardia regularly. Given  that she has normal blood pressure to mild hypertension with  no symptoms, patient can be discharged with close follow-up.  I recommend weekly BPP at your office that also affords an  opportunity to check her blood pressures. If frequent  appointments are not possible, patient may check her blood  pressures at home and return to your office in 2 weeks for  ultrasound. ----------------------------------------------------------------------                  Noralee Space, MD Electronically Signed Final Report   09/29/2018 11:11 am ----------------------------------------------------------------------  [place] Mfm Ob Follow Up  Result Date: 09/29/2018 ----------------------------------------------------------------------  OBSTETRICS REPORT                       (Signed Final 09/29/2018 11:11 am) ---------------------------------------------------------------------- Patient Info  ID #:       [id number]                          D.O.B.:  02/03/1986 (32 yrs)  Name:       Meredith Velez             Visit Date: 09/29/2018 09:45 am ---------------------------------------------------------------------- Performed By  Performed By:     Percell Boston          Ref. Address:     [address]                    [address]                                                              [address]                                                             [place]                                                             [id number]  Attending:        Noralee Spaceavi Shankar MD        [facility]                                                             [facility]  Referred By:      Essie HartWALDA PINN M           [facility] ---------------------------------------------------------------------- Orders   #  Description                          Code         Ordered By   1  US MFM FETAL BPP WO NON              76819.01     ELLIS GREER      STRESS   2  US MFM OB FOLLOW UP                  [id number]     Kathalene FramesELLIS GREER  ----------------------------------------------------------------------   #  Order #                    Accession #                 Episode #   1  [id number]   2  [id number]                  [id number]                  [id number]  ---------------------------------------------------------------------- Indications   [redacted] weeks gestation of pregnancy                Z3A.33   Hypertension - Chronic/Pre-existing            O10.019   Encounter for other antenatal screening  Z36.2   follow-up (low risk NIPS)  ---------------------------------------------------------------------- Vital Signs                                                 Height:        5'2" ---------------------------------------------------------------------- Fetal Evaluation  Num Of Fetuses:         1  Fetal Heart Rate(bpm):  157  Cardiac Activity:       Observed  Presentation:           Cephalic  Placenta:               Anterior  P. Cord Insertion:      Visualized, central  Amniotic Fluid  AFI FV:      Within normal limits  AFI Sum(cm)     %Tile       Largest Pocket(cm)  11.01           26          4.31  RUQ(cm)       RLQ(cm)       LUQ(cm)        LLQ(cm)  2.09          4.31           2.14           2.47 ---------------------------------------------------------------------- Biophysical Evaluation  Amniotic F.V:   Within normal limits       F. Tone:        Observed  F. Movement:    Observed                   Score:          8/8  F. Breathing:   Observed ---------------------------------------------------------------------- Biometry  BPD:      76.7  mm     G. Age:  30w 5d        < 1  %    CI:        72.59   %    70 - 86                                                          FL/HC:      21.7   %    19.4 - 21.8  HC:      286.3  mm     G. Age:  31w 3d        < 3  %    HC/AC:      1.03        0.96 - 1.11  AC:      276.7  mm     G. Age:  31w 5d          9  %    FL/BPD:     80.8   %    71 - 87  FL:         62  mm     G. Age:  32w 1d         10  %    FL/AC:      22.4   %    20 - 24  HUM:  55.8  mm     G. Age:  32w 3d         35  %  Est. FW:    1829  gm      4 lb 1 oz     25  % ---------------------------------------------------------------------- OB History  Gravidity:    2         Term:   1        Prem:   0        SAB:   0  TOP:          0       Ectopic:  0        Living: 0 ---------------------------------------------------------------------- Gestational Age  U/S Today:     31w 4d                                        EDD:   11/27/18  Best:          33w 4d     Det. By:  Marcella Dubs         EDD:   11/13/18 ---------------------------------------------------------------------- Anatomy  Cranium:               Appears normal         LVOT:                   Previously seen  Cavum:                 Previously seen        Aortic Arch:            Previously seen  Ventricles:            Appears normal         Ductal Arch:            Previously seen  Choroid Plexus:        Previously seen        Diaphragm:              Previously seen  Cerebellum:            Previously seen        Stomach:                Appears normal, left                                                                         sided  Posterior Fossa:       Previously seen        Abdomen:                Previously seen  Nuchal Fold:           Previously seen        Abdominal Wall:         Previously seen  Face:                  Orbits and profile     Cord Vessels:           Previously seen  previously seen  Lips:                  Previously seen        Kidneys:                Appear normal  Palate:                Not well visualized    Bladder:                Appears normal  Thoracic:              Appears normal         Spine:                  Previously seen  Heart:                 Previously seen        Upper Extremities:      Previously seen  RVOT:                  Previously seen        Lower Extremities:      Previously seen  Other:  Fetus appears to be a female. Heels visualized previously. Nasal bone          visualized previously. ---------------------------------------------------------------------- Cervix Uterus Adnexa  Cervix  Not visualized (advanced GA >24wks) ---------------------------------------------------------------------- Impression  Ms. Depree, G2 P1 at 33w 4d gestation, was admitted yesterday  with severe range blood pressures. She has chronic  hypertension that was diagnosed in 2014 after her previous  childbirth. Patient has been taking metoprolol before  pregnancy.  Since admission, her blood pressures have improved and are  in the normal to mild hypertensive range. I spoke with her on  phone after ultrasound.  She does not have symptoms of severe headache or visual  disturbances or right upper quadrant pain.  Obstetric history is significant for a term vaginal delivery. Her  pregnancy was complicated by gestational hypertension.  Labs including liver enzymes, creatinine and platelets are  normal. No increased proteinuria is seen.  On ultrasound, fetal growth is appropriate for gestational age.  Amniotic fluid is normal and good fetal activity is seen.  Antenatal testing is reassuring. BPP 8/8.   NST performed in the AP unit has been reassuring.  I reassured the patient of normal fetal growth. I discussed the  safey and importance of taking Procardia regularly. Given  that she has normal blood pressure to mild hypertension with  no symptoms, patient can be discharged with close follow-up.  I recommend weekly BPP at your office that also affords an  opportunity to check her blood pressures. If frequent  appointments are not possible, patient may check her blood  pressures at home and return to your office in 2 weeks for  ultrasound. ----------------------------------------------------------------------                  Noralee Space, MD Electronically Signed Final Report   09/29/2018 11:11 am ----------------------------------------------------------------------   CBG (last 3)  No results for input(s): GLUCAP in the last 72 hours.   I/O last 3 completed shifts: In: 4538.7 [P.O.:1800; I.V.:2738.7] Out: 2221 [Urine:2025; Blood:196]   Assessment Postpartum Day # 1 : S/P NSVD due to IOL for chronic hypertension with superimposed preeclampsia with severe features due to elevated severe range Bps and IUGR at 7%ile. EFW 5lbs2oz.Marland Kitchen Called by RN, concern for magnesium toxicity, mag turned, off, Pre  e labs ran results wnl lft, hgb drop to 8 but stable, creatinine 0.99, ldh and uric acid unremarkable. Pt had episode of decreased LOC while sitting on the toilet and pt legs were weak and collasping, RN and CNA had to help pt back into bed. Mag level resulted 6.2. Pt voiding 254mls/hr, 2+DTR patellar, LOC arousal with fatigue present, normal neurological exam. Pt blood sugar 121. Pt stable now. BP 117/71  Dr Mora Appl assessed and aware of CT results being unremarkable, no brain, hemorrhage.  Pt stable and reviewed form decrease LOC episode.   Plan: Continue other mgmt as ordered Anemia: Start Iron BID PO CHTN with Superimposed PreE with SF: Continue mag, until 1900 tonight. RN to continue nuero check. Monitor  BP, continue to take labetalol 200mg  BID, hold if BP <120/80. Mag level 6.2, discontinued mag per Dr Christian Mate order. VTE prophylactics: Early ambulated as tolerates.  Pain control: Motrin/Tylenol PRN Plan for discharge tomorrow, Breastfeeding, Lactation consult, Circumcision prior to discharge and Contraception Desires PP tubal.   Dr. Mora Appl to be updated on patient status Dr Mora Appl to perform circ tomorrow.   [facility] NP-C, CNM 10/21/2018, 2:11 PM

## 2018-10-21 NOTE — Lactation Note (Signed)
This note was copied from a baby's chart. Lactation Consultation Note:  P2 Mother on MaSO4, had severe PIH, Mother was induced and del vaginally. Infant is 36.4 at 4-12 lbs.   Mother reports that she pumped and breastfed her first child for 2 months.  Infant is 46 hours old and has had one 15 min breastfed but no latch was recorded.  Parents were given LPI guidelines. Reviewed hand expression.   Attempt to latch infant on the rt breast in football hold. Infant showing good feeding cues. He suckled on and off for 5 mins with a shallow latch.   Mother was fit with a #20 NS. NS was prefilled with formula and infant took 3 ml. His latch was deeper with the NS.   Attempt to feed infant with slow flow bottle nipple. Infant tongue thrusting nipple. Mother brought Motif nipples from home and ask to use them. Infant took 12 ml of formula for a total of 15 ml for the feeding.   Mothers plan is to hand express and give infant any amt with a spoon or curved tip syringe. Mother to attempt to breastfeed every 2-3 hours. Advised STS at 2.5 hours to rouse infant for feeding.  Mother to post pump after each feeding if she feels good enough. Mother presently needs sleep and is not feeding well.   Mother is reporting blurred vision , HA, reported to Staff nurse.  Father present and advised in feeding infant with bottle so mother could rest as much as possible.   LC will follow up with mother tomorrow and as needed. Mother is aware of available [place] services.   Patient Name: Meredith Velez QKMMN'O Date: 10/21/2018 Reason for consult: Initial assessment   Maternal Data Has patient been taught Hand Expression?: Yes  Feeding Feeding Type: Formula Nipple Type: Other(Motif slow flow from mothers personal pump kit)  LATCH Score Latch: Repeated attempts needed to sustain latch, nipple held in mouth throughout feeding, stimulation needed to elicit sucking reflex.  Audible Swallowing: A few with  stimulation  Type of Nipple: Everted at rest and after stimulation  Comfort (Breast/Nipple): Soft / non-tender  Hold (Positioning): Assistance needed to correctly position infant at breast and maintain latch.  LATCH Score: 7  Interventions Interventions: Breast feeding basics reviewed;Assisted with latch;Skin to skin;Hand express;Adjust position;Support pillows;Position options;Hand pump;DEBP  Lactation Tools Discussed/Used Tools: Nipple Shields Nipple shield size: 20 Breast pump type: Double-Electric Breast Pump;Manual Pump Review: Setup, frequency, and cleaning;Milk Storage Initiated by:: staff   nurse Date initiated:: 10/21/18   Consult Status Consult Status: Follow-up Date: 10/22/18 Follow-up type: In-patient    Jess Barters [facility] 10/21/2018, 12:40 PM

## 2018-10-21 NOTE — Progress Notes (Signed)
CRITICAL VALUE ALERT  Critical Value:  7.7 magnesium level  Date & Time Notied:  10/21/2018 11:12  Provider Notified: Dale Rosa  Orders Received/Actions taken: decrease magnesium to 1 gram an hour

## 2018-10-21 NOTE — Progress Notes (Signed)
RN spoke with [facility] about patients assessment.  She is having dizziness and weakness in her legs and RN was unable to get reflexes.  RN requested a Magnesium level.

## 2018-10-21 NOTE — Progress Notes (Signed)
RN evaluated patient and seen that she is very lethargic and sleepy.  She was unable to ambulate with the assistance of RN and CNA.  RN called Dale [place] to come evaluate patient.  She stated she was on the way.

## 2018-10-22 MED ORDER — NIFEDIPINE ER OSMOTIC RELEASE 30 MG PO TB24
60.0000 mg | ORAL_TABLET | Freq: Every day | ORAL | Status: DC
  Administered 2018-10-23: 60 mg via ORAL
  Filled 2018-10-22: qty 2

## 2018-10-22 MED ORDER — NIFEDIPINE ER OSMOTIC RELEASE 30 MG PO TB24
30.0000 mg | ORAL_TABLET | Freq: Every day | ORAL | Status: DC
  Administered 2018-10-22: 30 mg via ORAL
  Filled 2018-10-22: qty 1

## 2018-10-22 MED ORDER — NIFEDIPINE ER OSMOTIC RELEASE 30 MG PO TB24: 30.0000 mg | ORAL_TABLET | Freq: Every day | ORAL | Status: DC

## 2018-10-22 MED ORDER — NIFEDIPINE ER OSMOTIC RELEASE 30 MG PO TB24
30.0000 mg | ORAL_TABLET | Freq: Once | ORAL | Status: AC
  Administered 2018-10-22: 30 mg via ORAL
  Filled 2018-10-22: qty 1

## 2018-10-22 NOTE — Lactation Note (Signed)
This note was copied from a baby's chart. Lactation Consultation Note  Patient Name: Meredith Velez JSCBI'P Date: 10/22/2018 Reason for consult: Follow-up assessment   [phone] - [phone] - I visited Ms. Choinski today to assess her progress with breast feeding. She states that she is currently breast feeding baby "Apolinar Junes" 8-12 times a day on demand. She typically feeds at the breast for 10-15 minutes and the supplements baby with formula (up to 15 ml thus far). Baby ate just prior to my entry and was not interested in breast feeding. Baby is tolerating bottles well per mom, and she states that he is latching well.  Baby will be discharged tomorrow and will stay tonight for further monitoring. I encouraged mom to pump both breasts 3-4 times today for 15 minutes. She has a DEBP in the room, but she states she has not used it, opting to breast feed baby in lieu of pumping. I encouraged her to continue to breast feed baby as she has been doing, but to add in some pumping sessions post feedings. I provided rationale for why pumping may help increase milk production given baby's LPT status, low birth weight, and since baby is receiving supplementation. Mom verbalized understanding. She feels that 3-4 times a day is do-able (I encouraged her to pump after each feeding if she is able).  I set up mom's personal pump (motif) and helped her begin pumping. She prefers to use her personal pump since she will be using this at home. We fitted her flanges and adjusted her pump settings appropriately. Mom's left nipple everts well in the flange, and her right nipple appears short/flat.   I encouraged mom to call as needed for assistance today.   Maternal Data Formula Feeding for Exclusion: No Has patient been taught Hand Expression?: Yes Does the patient have breastfeeding experience prior to this delivery?: Yes  Feeding Feeding Type: Breast Fed Nipple Type: Other(Motif nipple)  LATCH Score                   Interventions Interventions: DEBP  Lactation Tools Discussed/Used Breast pump type: Double-Electric Breast Pump;Other (comment)(Personal - Motif) WIC Program: No   Consult Status Consult Status: Follow-up Date: 10/23/18 Follow-up type: In-patient    Walker Shadow 10/22/2018, 9:27 AM

## 2018-10-22 NOTE — Progress Notes (Signed)
Post Partum Day 2 Subjective: Patient doing well w/o complaints. She denies headache, no blurry vision, no RUQ pain.  She denies any heavy bleeding or pain.  She is both breast and bottle feeding.   Objective: Blood pressure (!) 152/96, pulse (!) 106, temperature 98.6 F (37 C), temperature source Oral, resp. rate 19, height 5\' 2"  (1.575 m), weight 86.3 kg, SpO2 98 %, unknown if currently breastfeeding.  Physical Exam:  General: alert, cooperative and no distress Lochia: appropriate Uterine Fundus: firm perineum healing well DVT Evaluation: No evidence of DVT seen on physical exam. Negative Homan's sign. No cords or calf tenderness.  Recent Labs    10/21/18 0529 10/21/18 1324  HGB 8.7* 8.0*  HCT 28.3* 25.3*    Assessment/Plan: Plan for discharge tomorrow, Breastfeeding and Circumcision done today Procardia 30 mg XL started today. BP still elevated will consider increasing to  60 mg if not well controlled.  Discussed post partum preeclampsia sx with patient    LOS: 2 days   Naser Schuld STACIA 10/22/2018, 3:21 PM

## 2018-10-22 NOTE — Plan of Care (Signed)
  Problem: Pain Managment: Goal: General experience of comfort will improve Outcome: Progressing   Problem: Safety: Goal: Ability to remain free from injury will improve Outcome: Progressing   

## 2018-10-22 NOTE — Progress Notes (Signed)
CSW received consult for hx of Anxiety and Depression.  CSW met with [name] in room 103  to offer support and complete assessment.    When CSW arrived, [name] was resting in bed and infant as gone for his circumcision. [name] was polite, forthcoming, and receptive to meeting with CSW.   CSW asked about [name]'s hx of PPD and [name] acknowledged hx.  [name] reported that [name] experienced baby blues and symptoms transitioned into PPD. [name] reported experiencing daily crying, feeling overwhelmed, and anxious for about 3 months postpartum. Per [name], [name] was prescribed Zoloft and her symptoms subsided.   CSW provided education regarding the baby blues period vs. perinatal mood disorders, discussed treatment and gave resources for mental health follow up if concerns arise.  CSW recommends self-evaluation during the postpartum time period using the New Mom Checklist from Postpartum Progress and encouraged [name] to contact a medical professional if symptoms are noted at any time. [name] presented with insight and awareness and did not demonstrate any acute MH signs or symptoms.  CSW assessed for safety and [name] denied SI, HI, and DV.  [name] reported having a strong support team that consists of [name] and FOB family members.    CSW identifies no further need for intervention and no barriers to discharge at this time.  Meredith Velez, MSW, LCSW Clinical Social Work [phone]  

## 2018-10-23 ENCOUNTER — Inpatient Hospital Stay (HOSPITAL_COMMUNITY): Payer: 59

## 2018-10-23 DIAGNOSIS — O9081 Anemia of the puerperium: Secondary | ICD-10-CM | POA: Diagnosis not present

## 2018-10-23 MED ORDER — NIFEDIPINE ER OSMOTIC RELEASE 30 MG PO TB24: 90.0000 mg | ORAL_TABLET | Freq: Every day | ORAL | Status: DC

## 2018-10-23 MED ORDER — ACETAMINOPHEN 325 MG PO TABS: 650.0000 mg | ORAL_TABLET | ORAL | 0 refills | Status: DC | PRN

## 2018-10-23 MED ORDER — FERROUS SULFATE 325 (65 FE) MG PO TABS: 325.0000 mg | ORAL_TABLET | Freq: Two times a day (BID) | ORAL | 3 refills | Status: DC

## 2018-10-23 MED ORDER — NIFEDIPINE ER OSMOTIC RELEASE 30 MG PO TB24: 90.0000 mg | ORAL_TABLET | Freq: Every day | ORAL | 1 refills | Status: DC

## 2018-10-23 MED ORDER — NIFEDIPINE ER OSMOTIC RELEASE 30 MG PO TB24
30.0000 mg | ORAL_TABLET | Freq: Once | ORAL | Status: AC
  Administered 2018-10-23: 30 mg via ORAL
  Filled 2018-10-23: qty 1

## 2018-10-23 NOTE — Lactation Note (Signed)
This note was copied from a baby's chart. Lactation Consultation Note  Patient Name: Meredith Velez [id number] Date: 10/23/2018 Reason for consult: Follow-up assessment;Infant < 6lbs;Late-preterm 34-36.6wks  64 hours old LPI < 5lbs who is being partially BF and formula fed by his mother, baby is on Similac 22 calorie formula. Mom and baby are going home today, baby at 3% weight loss with a DAT (+) status, but no apparent jaundice per MD note. Mom is now pumping 3 times/day and getting about 5 ml of colostrum, praised her for her efforts. She's also been putting baby to the breast for 10-15 minutes at time and let him nurse, she's no longer using a NS, baby able to latch without it.  Mom continues supplementing with Similac 22 calorie formula after feedings, baby is now taking 40 ml. Reviewed feeding cues even when babies are fed formula. Mom also understands the importance of pumping and that a baby this small is not going to be able to fully empty the breast when her milk comes in. Mom will try to pump at least 5 times/day in addition to putting baby to the breast, she said 8 times is not realistic and LC agreed with her goals, she's taking baby steps but not backing down.  Reviewed engorgement prevention and treatment, treatment/prevention for sore nipples, red flags on when to call your baby's pediatrician and discharge instructions.  Feeding plan:  1. Encouraged mom to feed baby STS every 3 hours or sooner if feeding cues are present 2. Mom will start pumping after feedings, but at least a minimum of 5 pumping sessions/24 hours 3. Parents will continue supplementing baby with Similac 22 calorie formula as needed 4. As mother's milk comes in mom will start using her EMB to slowly replace volumes of formula supplementation. Parents will revise feeding plan on next pediatrician appointment  Parents reported all questions and concerns were answered, they're both aware of LC OP services and will  call contact PRN.  Maternal Data    Feeding Feeding Type: Formula   Interventions    Lactation Tools Discussed/Used     Consult Status Consult Status: Complete Date: 10/23/18 Follow-up type: Call as needed    Toriano Aikey Venetia Constable 10/23/2018, 11:26 AM

## 2018-10-23 NOTE — Discharge Summary (Signed)
SVD OB Discharge Summary     Patient Name: Meredith Velez DOB: [birth year] MRN: [id number]  Date of admission: 10/20/2018 Delivering MD: Enis Slipper H  Date of delivery: 10/20/2018 Type of delivery: SVD  Newborn Data: Sex: Baby female  Circumcision: done in pt Live born female  Birth Weight: 4 lb 12 oz (2155 g) APGAR: 9, 9  Newborn Delivery   Birth date/time:  10/20/2018 19:15:00 Delivery type:  Vaginal, Spontaneous     Feeding: breast and bottle Infant being discharge to home with mother in stable condition.   Admitting diagnosis: pregnancy Intrauterine pregnancy: [redacted]w[redacted]d     Secondary diagnosis:  Active Problems:   IUGR (intrauterine growth restriction) affecting care of mother, third trimester, not applicable or unspecified fetus   Postpartum anemia   Normal postpartum course   Preterm delivery                                Complications: None                                                              Intrapartum Procedures: spontaneous vaginal delivery Postpartum Procedures: none, PP magnesium 15 hours Complications-Operative and Postpartum: none Augmentation: Pitocin and Cytotec   History of Present Illness: Meredith Velez is a 33 y.o. female, G2P1102, who presents at [redacted]w[redacted]d weeks gestation. The patient has been followed at  [facility]  Her pregnancy has been complicated by:  Patient Active Problem List   Diagnosis Date Noted  . Postpartum anemia 10/23/2018  . Normal postpartum course 10/23/2018  . Preterm delivery 10/23/2018  . IUGR (intrauterine growth restriction) affecting care of mother, third trimester, not applicable or unspecified fetus 10/20/2018  . Chronic hypertension affecting pregnancy 09/28/2018  . Short cervical length during pregnancy in third trimester 09/28/2018  . Postpartum depression 09/28/2018  . Anemia of pregnancy 08/06/2018  . Dichorionic diamniotic twin pregnancy in third trimester  05/28/2018  . Pregnant 05/09/2018  . History of peptic ulcer 05/06/2018  . Migraine headache without aura 02/28/2016  . Allergic rhinitis 02/28/2016  . GERD (gastroesophageal reflux disease) 07/12/2014  . Essential hypertension 02/21/2014    Hospital course:  Induction of Labor With Vaginal Delivery   33 y.o. yo [id number] at [redacted]w[redacted]d was admitted to the hospital 10/20/2018 for induction of labor.  Indication for induction: CHTN with superimposed preclampsia with severe features. .  Patient had an uncomplicated labor course as follows: Placed on magnesium with induction  Membrane Rupture Time/Date: 6:43 PM ,10/20/2018   Intrapartum Procedures: Episiotomy: None [1]                                         Lacerations:  None [1]  Patient had delivery of a Viable infant.  Information for the patient's newborn:  Aletta, Edmunds [[id number]]  Delivery Method: Vag-Spont   10/20/2018  Details of delivery can be found in separate delivery note.  Patient had a routine postpartum course. Patient is discharged home 10/23/18. Postpartum Day # 3 : S/P NSVD due to [facility] with superimposed preeclampsia with severe features and IUGR. Patient  up ad lib, denies syncope or dizziness. Reports consuming regular diet without issues and denies N/V. Patient reports 0 bowel movement + passing flatus.  Denies issues with urination and reports bleeding is "lighter."  Patient is breast and bottle feeding and reports going well.  Desires PPtubal for postpartum contraception.  Pain is being appropriately managed with use of po meds. On PPD# 1 pt has episode while of magnesium of decreased LOC, pt unable to know name, decreased reflexes per rn, pt has head CT which was neg and labs were negative as well, PP magnesium was stopped due to that, pt hd 15 hours PP> Pt stable now, denies HA, vision changes, nor RUQ pain. Pt was prescribed procardia 30xl, which was increased to 60, then 90 xl for elevated Bps. Pt may be discharge home if BP  <150/100.    Pt has asymptotic anemia now.   Physical exam  Vitals:   10/23/18 1129 10/23/18 1134 10/23/18 1400 10/23/18 1424  BP: (!) 152/94 (!) 152/94 (!) 158/99   Pulse: (!) 104 (!) 101 90 90  Resp:  18 18 18   Temp:  97.8 F (36.6 C)    TempSrc:  Oral    SpO2:  100%  98%  Weight:      Height:       General: alert, cooperative and no distress Lochia: appropriate Uterine Fundus: firm Perineum: Intact DVT Evaluation: No evidence of DVT seen on physical exam. Negative Homan's sign. No cords or calf tenderness. No significant calf/ankle edema.  Labs: Lab Results  Component Value Date   WBC 12.9 (H) 10/21/2018   HGB 8.0 (L) 10/21/2018   HCT 25.3 (L) 10/21/2018   MCV 88.5 10/21/2018   PLT 283 10/21/2018   CMP Latest Ref Rng & Units 10/21/2018  Glucose 70 - 99 mg/dL 013(H)  BUN 6 - 20 mg/dL 12  Creatinine 4.38 - 8.87 mg/dL 5.79  Sodium 728 - 206 mmol/L 136  Potassium 3.5 - 5.1 mmol/L 4.0  Chloride 98 - 111 mmol/L 106  CO2 22 - 32 mmol/L 21(L)  Calcium 8.9 - 10.3 mg/dL 7.3(L)  Total Protein 6.5 - 8.1 g/dL 0.1(V)  Total Bilirubin 0.3 - 1.2 mg/dL 0.4  Alkaline Phos 38 - 126 U/L 115  AST 15 - 41 U/L 24  ALT 0 - 44 U/L 22    Date of discharge: 10/23/2018 Discharge Diagnoses: CHTN with superimposed preeclamspia with severe features. Preterm delivery  Discharge instruction: per After Visit Summary and "Baby and Me Booklet".  After visit meds:  Allergies as of 10/23/2018   No Known Allergies     Medication List    STOP taking these medications   aspirin 81 MG chewable tablet     TAKE these medications   acetaminophen 325 MG tablet Commonly known as:  Tylenol Take 2 tablets (650 mg total) by mouth every 4 (four) hours as needed (for pain scale < 4).   ferrous sulfate 325 (65 FE) MG tablet Take 1 tablet (325 mg total) by mouth 2 (two) times daily with a meal.   fluticasone 50 MCG/ACT nasal spray Commonly known as:  FLONASE Place 2 sprays into both nostrils  daily.   NIFEdipine 30 MG 24 hr tablet Commonly known as:  PROCARDIA-XL/NIFEDICAL-XL Take 3 tablets (90 mg total) by mouth daily. What changed:  medication strength   prenatal multivitamin Tabs tablet Take 1 tablet by mouth daily at 12 noon.       Activity:  unrestricted and pelvic rest Advance as tolerated. Pelvic rest for 6 weeks.  Diet:                routine Medications: PNV, Colace, Iron and procardia Postpartum contraception: Tubal Ligation, PP desires.  Condition:  Pt discharge to home with baby in stable CHTN: procardia 90xl mg, daily. Report s/sx of HA, RUQ pain or vision changes, call if BP>150/100. F/U in 1 week at [facility], BP check.  Anemia: PO Iron TID.   Meds: Allergies as of 10/23/2018   No Known Allergies     Medication List    STOP taking these medications   aspirin 81 MG chewable tablet     TAKE these medications   acetaminophen 325 MG tablet Commonly known as:  Tylenol Take 2 tablets (650 mg total) by mouth every 4 (four) hours as needed (for pain scale < 4).   ferrous sulfate 325 (65 FE) MG tablet Take 1 tablet (325 mg total) by mouth 2 (two) times daily with a meal.   fluticasone 50 MCG/ACT nasal spray Commonly known as:  FLONASE Place 2 sprays into both nostrils daily.   NIFEdipine 30 MG 24 hr tablet Commonly known as:  PROCARDIA-XL/NIFEDICAL-XL Take 3 tablets (90 mg total) by mouth daily. What changed:  medication strength   prenatal multivitamin Tabs tablet Take 1 tablet by mouth daily at 12 noon.       Discharge Follow Up:  Follow-up Information    [facility] Follow up.   Specialty:  Obstetrics and Gynecology Why:  Please call [facility] and make a 1 week f/u for BP check and 4 week mental health visit.  Contact information: [address] [place] [address] [phone]           [place], NP-C, CNM 10/23/2018, 6:27 PM  Dale [place], FNP

## 2018-12-15 ENCOUNTER — Other Ambulatory Visit: Payer: Self-pay | Admitting: Obstetrics and Gynecology

## 2018-12-22 NOTE — H&P (Signed)
Meredith Velez is a 33 y.o.  female, P: 2-0-0-2 who present for sterilization because of her desire to cease procreating. In the past the patient has used oral contraception, Nuvaring, Depo Provera and the Paragard IUD for contraception.  Her menstrual periods are monthly for 5-7 days with a pad change bid. Sh will occasionally have cramping but it does not require analgesia. She has now completed her family after having #2 vaginal deliveries, the most recent 10/20/2018 and would now like to proceed with permanent sterilization.  Past Medical History  OB History: G:2  P: 2-0-0-2: SVB 2014 and 2020  GYN History: menarche: 33 YO    LMP: 12/01/2018;    Contraception: none  Denies history of abnormal PAP but has had chlamydia and herpes simplex 1;   Last PAP smear: 2018-normal  Medical History: Hypertension, Peptic Ulcer Disease, Migraine without aura, GERD, Depression, Anxiety and Anemia  Surgical History: NONE Denies history of blood transfusions  Family History: Hypertension, Hyperlipidemia, Cardiovascular Disease, Renal Disease, Diabetes Mellitus, Alzheimers, Colon, Breast and Stomach Cancers, Asthma, Stroke and Thyroid Disease  Social History: Married and employed with [employer];  Denies tobacco or alcohol use  Medications: Nifedipine 30 mg ER  # 3  po daily Lexapro 10 mg daily Prenatal Vitamins  No Known Allergies  Denies sensitivity to peanuts, shellfish, soy, latex or adhesives.  ROS: Admits to glasses and rare headaches but denies  vision changes, nasal congestion, dysphagia, tinnitus, dizziness, hoarseness, cough,  chest pain, shortness of breath, nausea, vomiting, diarrhea,constipation,  urinary frequency, urgency  dysuria, hematuria, vaginitis symptoms, pelvic pain, swelling of joints,easy bruising,  myalgias, arthralgias, skin rashes, unexplained weight loss and except as is mentioned in the history of present illness, patient's review of systems is  otherwise negative.   Physical Exam  Bp: 140/90  P: 102 bpm  R: 16  O2Sat.: 100% (room air);   Weight: 180 lbs.  Height: 5'2"  BMI: 32.9  Neck: supple without masses or thyromegaly Lungs: clear to auscultation Heart: regular rate and rhythm Abdomen: soft, non-tender and no organomegaly Pelvic:EGBUS- wnl; vagina-normal rugae; uterus-normal size (exam limited by habitus) , cervix without lesions or motion tenderness; adnexae-no tenderness or masses Extremities:  no clubbing, cyanosis or edema   Assesment: Desire for Sterilization                       Hypertension  Disposition:  A discussion was held with patient regarding the indication for her procedure(s) along with the risks, which include but are not limited to: reaction to anesthesia, damage to adjacent organs, infection and excessive bleeding. The patient verbalized understanding of these risks and has consented to proceed with Laparoscopic Bilateral Salpingectomy at [facility] on January 04, 2019 at 2 p.m.   CSN# [id number]   Anjelica Gorniak J. Florene Glen, PA-C  for Dr. Harvie Bridge. Mancel Bale

## 2018-12-29 ENCOUNTER — Telehealth: Payer: Self-pay | Admitting: Emergency Medicine

## 2018-12-29 NOTE — Telephone Encounter (Signed)
Patient called stating please disregard. She is doing a TOC within [place].

## 2018-12-29 NOTE — Telephone Encounter (Signed)
Pt would like to transfer from Dr [place] to Jodi Mourning. [place] office is closer for pt. Please advise if okay to switch.

## 2018-12-31 ENCOUNTER — Other Ambulatory Visit (HOSPITAL_COMMUNITY)
Admission: RE | Admit: 2018-12-31 | Discharge: 2018-12-31 | Disposition: A | Discharge status: Home / Self Care | Payer: 59 | Source: Ambulatory Visit | Attending: Obstetrics and Gynecology | Admitting: Obstetrics and Gynecology

## 2018-12-31 DIAGNOSIS — Z1159 Encounter for screening for other viral diseases: Secondary | ICD-10-CM | POA: Insufficient documentation

## 2018-12-31 LAB — SARS CORONAVIRUS 2 (TAT 6-24 HRS): SARS Coronavirus 2: NEGATIVE

## 2019-01-02 ENCOUNTER — Encounter (HOSPITAL_BASED_OUTPATIENT_CLINIC_OR_DEPARTMENT_OTHER): Payer: Self-pay

## 2019-01-02 ENCOUNTER — Other Ambulatory Visit: Payer: Self-pay

## 2019-01-02 NOTE — Progress Notes (Signed)
SPOKE W/  Meredith Velez     SCREENING SYMPTOMS OF COVID 19:   COUGH--NO  RUNNY NOSE--- NO  SORE THROAT---NO  NASAL CONGESTION----NO  SNEEZING----NO  SHORTNESS OF BREATH---NO  DIFFICULTY BREATHING---NO  TEMP >100.0 -----NO  UNEXPLAINED BODY ACHES------NO  CHILLS -------- NO  HEADACHES ---------NO  LOSS OF SMELL/ TASTE --------NO    HAVE YOU OR ANY FAMILY MEMBER TRAVELLED PAST 14 DAYS OUT OF THE   COUNTY---NO STATE----NO COUNTRY----NO  HAVE YOU OR ANY FAMILY MEMBER BEEN EXPOSED TO ANYONE WITH COVID 19? NO

## 2019-01-02 NOTE — Progress Notes (Signed)
Spoke with: Meredith Velez NPO: No food after midnight/Clear liquids until 7:45 AM DOS Arrival time:  1145AM Labs: UPT (CBC, BMP, EKG, COVID in epic) AM medications: Nifedipine Pre op orders: Yes Ride home:  Erlene Quan (husband) [phone] or Rod Holler (mom) [phone]

## 2019-01-03 ENCOUNTER — Encounter (HOSPITAL_COMMUNITY)
Admission: RE | Admit: 2019-01-03 | Discharge: 2019-01-03 | Disposition: A | Discharge status: Home / Self Care | Payer: 59 | Source: Ambulatory Visit | Attending: Obstetrics and Gynecology | Admitting: Obstetrics and Gynecology

## 2019-01-03 DIAGNOSIS — Z302 Encounter for sterilization: Secondary | ICD-10-CM | POA: Diagnosis present

## 2019-01-03 DIAGNOSIS — F419 Anxiety disorder, unspecified: Secondary | ICD-10-CM | POA: Diagnosis not present

## 2019-01-03 DIAGNOSIS — K219 Gastro-esophageal reflux disease without esophagitis: Secondary | ICD-10-CM | POA: Diagnosis not present

## 2019-01-03 DIAGNOSIS — F329 Major depressive disorder, single episode, unspecified: Secondary | ICD-10-CM | POA: Diagnosis not present

## 2019-01-03 DIAGNOSIS — I10 Essential (primary) hypertension: Secondary | ICD-10-CM | POA: Diagnosis not present

## 2019-01-03 LAB — CBC
HCT: 40.1 % (ref 36.0–46.0)
Hemoglobin: 12.8 g/dL (ref 12.0–15.0)
MCH: 29.3 pg (ref 26.0–34.0)
MCHC: 31.9 g/dL (ref 30.0–36.0)
MCV: 91.8 fL (ref 80.0–100.0)
Platelets: 402 10*3/uL — ABNORMAL HIGH (ref 150–400)
RBC: 4.37 MIL/uL (ref 3.87–5.11)
RDW: 13.5 % (ref 11.5–15.5)
WBC: 8.2 10*3/uL (ref 4.0–10.5)
nRBC: 0 % (ref 0.0–0.2)

## 2019-01-03 LAB — BASIC METABOLIC PANEL
Anion gap: 9 (ref 5–15)
BUN: 19 mg/dL (ref 6–20)
CO2: 22 mmol/L (ref 22–32)
Calcium: 9.1 mg/dL (ref 8.9–10.3)
Chloride: 106 mmol/L (ref 98–111)
Creatinine, Ser: 0.85 mg/dL (ref 0.44–1.00)
GFR calc Af Amer: >60 mL/min (ref >60)
GFR calc non Af Amer: >60 mL/min (ref >60)
Glucose, Bld: 83 mg/dL (ref 70–99)
Potassium: 4.2 mmol/L (ref 3.5–5.1)
Sodium: 137 mmol/L (ref 135–145)

## 2019-01-04 ENCOUNTER — Other Ambulatory Visit: Payer: Self-pay

## 2019-01-04 ENCOUNTER — Encounter (HOSPITAL_BASED_OUTPATIENT_CLINIC_OR_DEPARTMENT_OTHER): Payer: Self-pay | Admitting: Emergency Medicine

## 2019-01-04 ENCOUNTER — Ambulatory Visit (HOSPITAL_BASED_OUTPATIENT_CLINIC_OR_DEPARTMENT_OTHER): Payer: 59 | Admitting: Anesthesiology

## 2019-01-04 ENCOUNTER — Ambulatory Visit (HOSPITAL_BASED_OUTPATIENT_CLINIC_OR_DEPARTMENT_OTHER)
Admission: RE | Admit: 2019-01-04 | Discharge: 2019-01-04 | Disposition: A | Discharge status: Home / Self Care | Payer: 59 | Attending: Obstetrics and Gynecology | Admitting: Obstetrics and Gynecology

## 2019-01-04 ENCOUNTER — Encounter (HOSPITAL_BASED_OUTPATIENT_CLINIC_OR_DEPARTMENT_OTHER)
Admission: RE | Disposition: A | Discharge status: Home / Self Care | Payer: Self-pay | Source: Home / Self Care | Attending: Obstetrics and Gynecology

## 2019-01-04 DIAGNOSIS — F419 Anxiety disorder, unspecified: Secondary | ICD-10-CM | POA: Insufficient documentation

## 2019-01-04 DIAGNOSIS — K219 Gastro-esophageal reflux disease without esophagitis: Secondary | ICD-10-CM | POA: Insufficient documentation

## 2019-01-04 DIAGNOSIS — Z302 Encounter for sterilization: Secondary | ICD-10-CM | POA: Insufficient documentation

## 2019-01-04 DIAGNOSIS — I10 Essential (primary) hypertension: Secondary | ICD-10-CM | POA: Insufficient documentation

## 2019-01-04 DIAGNOSIS — F329 Major depressive disorder, single episode, unspecified: Secondary | ICD-10-CM | POA: Insufficient documentation

## 2019-01-04 HISTORY — DX: Presence of spectacles and contact lenses: Z97.3

## 2019-01-04 HISTORY — DX: Anxiety disorder, unspecified: F41.9

## 2019-01-04 HISTORY — DX: Other seasonal allergic rhinitis: J30.2

## 2019-01-04 HISTORY — PX: LAPAROSCOPIC BILATERAL SALPINGECTOMY: SHX5889

## 2019-01-04 LAB — POCT PREGNANCY, URINE: Preg Test, Ur: NEGATIVE

## 2019-01-04 SURGERY — SALPINGECTOMY, BILATERAL, LAPAROSCOPIC
Anesthesia: General | Site: Abdomen | Laterality: Bilateral

## 2019-01-04 MED ORDER — ACETAMINOPHEN 500 MG PO TABS
ORAL_TABLET | ORAL | Status: AC
  Filled 2019-01-04: qty 2

## 2019-01-04 MED ORDER — SCOPOLAMINE 1 MG/3DAYS TD PT72
1.0000 | MEDICATED_PATCH | TRANSDERMAL | Status: DC
  Administered 2019-01-04: 1.5 mg via TRANSDERMAL
  Filled 2019-01-04: qty 1

## 2019-01-04 MED ORDER — FENTANYL CITRATE (PF) 100 MCG/2ML IJ SOLN
INTRAMUSCULAR | Status: AC
  Filled 2019-01-04: qty 2

## 2019-01-04 MED ORDER — BUPIVACAINE HCL (PF) 0.25 % IJ SOLN
INTRAMUSCULAR | Status: DC | PRN
  Administered 2019-01-04: 15 mL

## 2019-01-04 MED ORDER — KETOROLAC TROMETHAMINE 30 MG/ML IJ SOLN
INTRAMUSCULAR | Status: DC | PRN
  Administered 2019-01-04: 30 mg via INTRAVENOUS

## 2019-01-04 MED ORDER — ACETAMINOPHEN 500 MG PO TABS
1000.0000 mg | ORAL_TABLET | Freq: Once | ORAL | Status: AC
  Administered 2019-01-04: 1000 mg via ORAL
  Filled 2019-01-04: qty 2

## 2019-01-04 MED ORDER — SCOPOLAMINE 1 MG/3DAYS TD PT72
MEDICATED_PATCH | TRANSDERMAL | Status: AC
  Filled 2019-01-04: qty 1

## 2019-01-04 MED ORDER — FENTANYL CITRATE (PF) 100 MCG/2ML IJ SOLN
INTRAMUSCULAR | Status: DC | PRN
  Administered 2019-01-04: 25 ug via INTRAVENOUS
  Administered 2019-01-04: 50 ug via INTRAVENOUS
  Administered 2019-01-04: 25 ug via INTRAVENOUS

## 2019-01-04 MED ORDER — KETOROLAC TROMETHAMINE 30 MG/ML IJ SOLN
INTRAMUSCULAR | Status: AC
  Filled 2019-01-04: qty 1

## 2019-01-04 MED ORDER — DEXAMETHASONE SODIUM PHOSPHATE 10 MG/ML IJ SOLN
INTRAMUSCULAR | Status: AC
  Filled 2019-01-04: qty 1

## 2019-01-04 MED ORDER — DEXAMETHASONE SODIUM PHOSPHATE 4 MG/ML IJ SOLN
INTRAMUSCULAR | Status: DC | PRN
  Administered 2019-01-04: 10 mg via INTRAVENOUS

## 2019-01-04 MED ORDER — SUGAMMADEX SODIUM 200 MG/2ML IV SOLN
INTRAVENOUS | Status: AC
  Filled 2019-01-04: qty 2

## 2019-01-04 MED ORDER — IBUPROFEN 600 MG PO TABS: 600.0000 mg | ORAL_TABLET | Freq: Four times a day (QID) | ORAL | 0 refills | Status: DC | PRN

## 2019-01-04 MED ORDER — PROPOFOL 10 MG/ML IV BOLUS
INTRAVENOUS | Status: AC
  Filled 2019-01-04: qty 20

## 2019-01-04 MED ORDER — PROPOFOL 10 MG/ML IV BOLUS
INTRAVENOUS | Status: DC | PRN
  Administered 2019-01-04: 200 mg via INTRAVENOUS

## 2019-01-04 MED ORDER — LACTATED RINGERS IV SOLN
INTRAVENOUS | Status: DC
  Administered 2019-01-04: 12:00:00 via INTRAVENOUS
  Filled 2019-01-04: qty 1000

## 2019-01-04 MED ORDER — FENTANYL CITRATE (PF) 100 MCG/2ML IJ SOLN
25.0000 ug | INTRAMUSCULAR | Status: DC | PRN
  Filled 2019-01-04: qty 1

## 2019-01-04 MED ORDER — ROCURONIUM BROMIDE 10 MG/ML (PF) SYRINGE
PREFILLED_SYRINGE | INTRAVENOUS | Status: AC
  Filled 2019-01-04: qty 10

## 2019-01-04 MED ORDER — ONDANSETRON HCL 4 MG/2ML IJ SOLN
INTRAMUSCULAR | Status: DC | PRN
  Administered 2019-01-04: 4 mg via INTRAVENOUS

## 2019-01-04 MED ORDER — LIDOCAINE 2% (20 MG/ML) 5 ML SYRINGE
INTRAMUSCULAR | Status: AC
  Filled 2019-01-04: qty 5

## 2019-01-04 MED ORDER — MIDAZOLAM HCL 5 MG/5ML IJ SOLN
INTRAMUSCULAR | Status: DC | PRN
  Administered 2019-01-04: 2 mg via INTRAVENOUS

## 2019-01-04 MED ORDER — MIDAZOLAM HCL 2 MG/2ML IJ SOLN
INTRAMUSCULAR | Status: AC
  Filled 2019-01-04: qty 2

## 2019-01-04 MED ORDER — ONDANSETRON HCL 4 MG/2ML IJ SOLN
INTRAMUSCULAR | Status: AC
  Filled 2019-01-04: qty 2

## 2019-01-04 MED ORDER — ROCURONIUM BROMIDE 100 MG/10ML IV SOLN
INTRAVENOUS | Status: DC | PRN
  Administered 2019-01-04: 60 mg via INTRAVENOUS

## 2019-01-04 MED ORDER — SUGAMMADEX SODIUM 200 MG/2ML IV SOLN
INTRAVENOUS | Status: DC | PRN
  Administered 2019-01-04: 200 mg via INTRAVENOUS

## 2019-01-04 MED ORDER — LIDOCAINE HCL (CARDIAC) PF 100 MG/5ML IV SOSY
PREFILLED_SYRINGE | INTRAVENOUS | Status: DC | PRN
  Administered 2019-01-04: 100 mg via INTRAVENOUS

## 2019-01-04 MED ORDER — OXYCODONE-ACETAMINOPHEN 5-325 MG PO TABS: 1.0000 | ORAL_TABLET | Freq: Four times a day (QID) | ORAL | 0 refills | Status: DC | PRN

## 2019-01-04 SURGICAL SUPPLY — 41 items
CABLE HIGH FREQUENCY MONO STRZ (ELECTRODE) IMPLANT
CATH ROBINSON RED A/P 16FR (CATHETERS) IMPLANT
CHLORAPREP W/TINT 26 (MISCELLANEOUS) ×2 IMPLANT
DERMABOND ADVANCED (GAUZE/BANDAGES/DRESSINGS) ×1
DERMABOND ADVANCED .7 DNX12 (GAUZE/BANDAGES/DRESSINGS) ×1 IMPLANT
DILATOR CANAL MILEX (MISCELLANEOUS) IMPLANT
DRSG OPSITE POSTOP 3X4 (GAUZE/BANDAGES/DRESSINGS) IMPLANT
DURAPREP 26ML APPLICATOR (WOUND CARE) IMPLANT
FORCEPS CUTTING 33CM 5MM (CUTTING FORCEPS) IMPLANT
FORCEPS CUTTING 45CM 5MM (CUTTING FORCEPS) ×2 IMPLANT
GLOVE BIO SURGEON STRL SZ7.5 (GLOVE) ×4 IMPLANT
GLOVE BIOGEL PI IND STRL 7.0 (GLOVE) ×2 IMPLANT
GLOVE BIOGEL PI IND STRL 7.5 (GLOVE) ×3 IMPLANT
GLOVE BIOGEL PI INDICATOR 7.0 (GLOVE) ×2
GLOVE BIOGEL PI INDICATOR 7.5 (GLOVE) ×3
GOWN STRL REUS W/ TWL LRG LVL3 (GOWN DISPOSABLE) ×2 IMPLANT
GOWN STRL REUS W/TWL LRG LVL3 (GOWN DISPOSABLE) ×2 IMPLANT
HIBICLENS CHG 4% 4OZ BTL (MISCELLANEOUS) IMPLANT
NEEDLE INSUFFLATION 120MM (ENDOMECHANICALS) ×2 IMPLANT
NS IRRIG 1000ML POUR BTL (IV SOLUTION) IMPLANT
PACK LAPAROSCOPY BASIN (CUSTOM PROCEDURE TRAY) ×2 IMPLANT
PACK TRENDGUARD 450 HYBRID PRO (MISCELLANEOUS) ×1 IMPLANT
PACK TRENDGUARD 600 HYBRD PROC (MISCELLANEOUS) IMPLANT
POUCH SPECIMEN RETRIEVAL 10MM (ENDOMECHANICALS) IMPLANT
SET IRRIG TUBING LAPAROSCOPIC (IRRIGATION / IRRIGATOR) IMPLANT
SHEARS HARMONIC ACE PLUS 36CM (ENDOMECHANICALS) IMPLANT
SLEEVE XCEL OPT CAN 5 100 (ENDOMECHANICALS) IMPLANT
SOLUTION ELECTROLUBE (MISCELLANEOUS) IMPLANT
SUT MNCRL AB 3-0 PS2 18 (SUTURE) ×2 IMPLANT
SUT MNCRL AB 3-0 PS2 27 (SUTURE) ×2 IMPLANT
SUT VICRYL 0 ENDOLOOP (SUTURE) IMPLANT
SUT VICRYL 0 TIES 12 18 (SUTURE) IMPLANT
SUT VICRYL 0 UR6 27IN ABS (SUTURE) ×2 IMPLANT
SUT VICRYL 4-0 PS2 18IN ABS (SUTURE) IMPLANT
SYR 50ML LL SCALE MARK (SYRINGE) ×2 IMPLANT
TRAY FOLEY W/BAG SLVR 14FR LF (SET/KITS/TRAYS/PACK) ×2 IMPLANT
TRENDGUARD 450 HYBRID PRO PACK (MISCELLANEOUS) ×2
TRENDGUARD 600 HYBRID PROC PK (MISCELLANEOUS)
TROCAR XCEL NON-BLD 11X100MML (ENDOMECHANICALS) ×2 IMPLANT
TROCAR XCEL NON-BLD 5MMX100MML (ENDOMECHANICALS) ×2 IMPLANT
WARMER LAPAROSCOPE (MISCELLANEOUS) ×2 IMPLANT

## 2019-01-04 NOTE — Interval H&P Note (Signed)
History and Physical Interval Note:  01/04/2019 1:34 PM  Meredith Velez  has presented today for surgery, with the diagnosis of Desire for Surgical sterilization.  The various methods of treatment have been discussed with the patient and family. After consideration of risks, benefits and other options for treatment, the patient has consented to  Procedure(s): LAPAROSCOPIC BILATERAL SALPINGECTOMY (Bilateral) as a surgical intervention.  The patient's history has been reviewed, patient examined, no change in status, stable for surgery.  I have reviewed the patient's chart and labs.  Questions were answered to the patient's satisfaction.     [name]

## 2019-01-04 NOTE — Op Note (Signed)
Preop Diagnosis: Desires Surgical Sterilzation  Postop Diagnosis: Desires Surgical Sterilzation  Procedure: LAPAROSCOPIC BILATERAL SALPINGECTOMY  Anesthesia: General   Attending: [name], MD   Assistant:   Findings: Normal appearing bilateral ovaries and tubes  Pathology: Bilateral fallopian tubes  Fluids: 800 cc  UOP: 75 cc  EBL: 2 cc  Complications: None  Procedure: The patient was taken to the operating room after the risks, benefits, alternatives, complications, treatment options, and expected outcomes were discussed with the patient. The patient verbalized understanding, the patient concurred with the proposed plan and consent signed and witnessed. The patient was taken to the Operating Room 1, identified as Halford Chessman  and procedure verified as sterilization procedure via laparoscopic bilateral salpingectomy. A Time Out was held and the above information confirmed.  The patient was placed under general anesthesia per anesthesia staff, the patient was placed in modified dorsal lithotomy position and was prepped, draped, and catheterized in the normal, sterile fashion.  The cervix was visualized and a hulka intrauterine manipulator was placed. A  10 mm umbilical incision was then performed. Veress needle was passed and pneumoperitoneum was established. A 10 mm trocar was advanced into the intraabdominal cavity, the laparoscope was introduced and findings as noted above.  A 56mm incision was made suprapubically and 25mm trocar advanced into the intraabdominal cavity under direct visualization.  The right fallopian tube was identified and carried out to its fimbriated end and excised with the Gyrus tripolar.  The same was done on the contralateral side.  The 26mm trocar was removed under direct visualization and pneumoperitoneum released.  The laparoscope was removed under direct visualization.  The fascia was repaired with a figure of eight stitch of 0 vicryl and the skin  was reapproximated with 3-0 monocryl via subcuticular stitch.  Dermabond was applied to close the 105mm incision and used to reinforce the 29UT umbilical incision.  Sponge, lap and needle counts were correct.  Patient tolerated the procedure well and was returned to the recovery room in good condition.

## 2019-01-04 NOTE — Anesthesia Procedure Notes (Signed)
Procedure Name: Intubation Date/Time: 01/04/2019 2:11 PM Performed by: Justice Rocher, CRNA Pre-anesthesia Checklist: Patient identified, Emergency Drugs available, Suction available and Patient being monitored Patient Re-evaluated:Patient Re-evaluated prior to induction Oxygen Delivery Method: Circle system utilized Preoxygenation: Pre-oxygenation with 100% oxygen Induction Type: IV induction Ventilation: Mask ventilation without difficulty Laryngoscope Size: Mac and 4 Grade View: Grade II Tube type: Oral Tube size: 7.5 mm Number of attempts: 1 Airway Equipment and Method: Stylet and Oral airway Placement Confirmation: ETT inserted through vocal cords under direct vision,  positive ETCO2 and breath sounds checked- equal and bilateral Secured at: 21 cm Tube secured with: Tape Dental Injury: Teeth and Oropharynx as per pre-operative assessment

## 2019-01-04 NOTE — Transfer of Care (Signed)
Immediate Anesthesia Transfer of Care Note  Patient: Meredith Velez  Procedure(s) Performed: Procedure(s) (LRB): LAPAROSCOPIC BILATERAL SALPINGECTOMY (Bilateral)  Patient Location: PACU  Anesthesia Type: General  Level of Consciousness: awake, sedated, patient cooperative and responds to stimulation  Airway & Oxygen Therapy: Patient Spontanous Breathing and Patient connected to [place] O2 and soft face mask  Post-op Assessment: Report given to PACU RN, Post -op Vital signs reviewed and stable and Patient moving all extremities  Post vital signs: Reviewed and stable  Complications: No apparent anesthesia complications

## 2019-01-04 NOTE — Discharge Instructions (Addendum)
Post Anesthesia Home Care Instructions  Activity: Get plenty of rest for the remainder of the day. A responsible individual must stay with you for 24 hours following the procedure.  For the next 24 hours, DO NOT: -Drive a car -Advertising copywriter -Drink alcoholic beverages -Take any medication unless instructed by your physician -Make any legal decisions or sign important papers.  Meals: Start with liquid foods such as gelatin or soup. Progress to regular foods as tolerated. Avoid greasy, spicy, heavy foods. If nausea and/or vomiting occur, drink only clear liquids until the nausea and/or vomiting subsides. Call your physician if vomiting continues.  Special Instructions/Symptoms: Your throat may feel dry or sore from the anesthesia or the breathing tube placed in your throat during surgery. If this causes discomfort, gargle with warm salt water. The discomfort should disappear within 24 hours.  If you had a scopolamine patch placed behind your ear for the management of post- operative nausea and/or vomiting:  1. The medication in the patch is effective for 72 hours, after which it should be removed.  Wrap patch in a tissue and discard in the trash. Wash hands thoroughly with soap and water. 2. You may remove the patch earlier than 72 hours if you experience unpleasant side effects which may include dry mouth, dizziness or visual disturbances. 3. Avoid touching the patch. Wash your hands with soap and water after contact with the patch.  May take ibuprofen at 9:00 PM    Surgery to Prevent Pregnancy Female sterilization is surgery to prevent pregnancy. In this surgery, the fallopian tubes are either blocked or closed off. When the fallopian tubes are closed, the eggs that the ovaries release cannot enter the uterus, sperm cannot reach the eggs, and you cannot get pregnant. Sterilization is permanent. It should only be done if you are sure that you do not want to be able to have  children. What are the sterilization surgery options? There are several kinds of female sterilization surgeries. They include:  Laparoscopic tubal ligation. In this surgery, the fallopian tubes are tied off, sealed with heat, or blocked with a clip, ring, or clamp. A small portion of each fallopian tube may also be removed. This surgery is done through several small cuts (incisions) with special instruments that are inserted into your abdomen.  Postpartum tubal ligation. This is also called a mini-laparotomy. This surgery is done right after childbirth or 1 or 2 days after childbirth. In this surgery, the fallopian tubes are tied off, sealed with heat, or blocked with a clip, ring, or clamp. A small portion of each fallopian tube may also be removed. The surgery is done through a single incision in the abdomen.  Tubal ligation during a C-section. In this surgery, the fallopian tubes are tied off, sealed with heat, or blocked with a clip, ring, or clamp. A small portion of each fallopian tube may also be removed. The surgery is done at the same time as a C-section delivery. Is sterilization safe? Generally, sterilization is safe. Complications are rare. However, there are risks. They include:  Bleeding.  Infection.  Reaction to medicine used during the procedure.  Injury to surrounding organs.  Failure of the procedure. How effective is sterilization? Sterilization is nearly 100% effective, but it can fail. In rare cases, the fallopian tubes can grow back together over time. If this happens, pregnancy may be possible and you will be able to get pregnant again. Women who have had this procedure have a higher chance of having  an ectopic pregnancy. An ectopic pregnancy is a pregnancy that happens outside of the uterus. This kind of pregnancy can lead to serious bleeding if it is not treated. What are the benefits?  It is usually effective for a lifetime.  It is usually safe.  It does not  have the drawbacks of other types of birth control in that your hormones are not affected. Because of this, your menstrual periods, sexual desire, and sexual performance will not be affected. What are the drawbacks?  You will need to recover and may have complications after surgery.  If you change your mind and decide that you want to have children, you may not be able to. Sterilization may be reversed, but a reversal is not always successful.  It does not provide protection against STDs (sexually transmitted diseases).  It increases the chance of having an ectopic pregnancy. Follow these instructions at home:  Keep all follow-up visits as told by your health care provider. This is important. Summary  Female sterilization is surgery to prevent pregnancy.  There are different types of female sterilization surgeries.  Sterilization may be reversed, but a reversal is not always successful.  Sterilization does not protect against STDs. This information is not intended to replace advice given to you by your health care provider. Make sure you discuss any questions you have with your health care provider. Document Released: 12/09/2007 Document Revised: 03/03/2018 Document Reviewed: 03/04/2018 Elsevier Patient Education  2020 [employer].

## 2019-01-04 NOTE — Anesthesia Preprocedure Evaluation (Addendum)
Anesthesia Evaluation  Patient identified by MRN, date of birth, ID band Patient awake    Reviewed: Allergy & Precautions, NPO status , Patient's Chart, lab work & pertinent test results  Airway Mallampati: II  TM Distance: >3 FB Neck ROM: Full    Dental no notable dental hx. (+) Teeth Intact, Dental Advisory Given   Pulmonary    Pulmonary exam normal breath sounds clear to auscultation       Cardiovascular hypertension, Pt. on medications Normal cardiovascular exam Rhythm:Regular Rate:Normal     Neuro/Psych  Headaches, PSYCHIATRIC DISORDERS Anxiety Depression    GI/Hepatic PUD, GERD  Medicated,  Endo/Other    Renal/GU      Musculoskeletal   Abdominal   Peds  Hematology   Anesthesia Other Findings   Reproductive/Obstetrics                            Anesthesia Physical Anesthesia Plan  ASA: II  Anesthesia Plan: General   Post-op Pain Management:    Induction: Intravenous  PONV Risk Score and Plan: 3 and Midazolam, Dexamethasone and Ondansetron  Airway Management Planned: Oral ETT  Additional Equipment:   Intra-op Plan:   Post-operative Plan: Extubation in OR  Informed Consent: I have reviewed the patients History and Physical, chart, labs and discussed the procedure including the risks, benefits and alternatives for the proposed anesthesia with the patient or authorized representative who has indicated his/her understanding and acceptance.     Dental advisory given  Plan Discussed with: CRNA  Anesthesia Plan Comments:         Anesthesia Quick Evaluation

## 2019-01-05 ENCOUNTER — Encounter (HOSPITAL_BASED_OUTPATIENT_CLINIC_OR_DEPARTMENT_OTHER): Payer: Self-pay | Admitting: Obstetrics and Gynecology

## 2019-01-05 NOTE — Anesthesia Postprocedure Evaluation (Signed)
Anesthesia Post Note  Patient: Meredith Velez  Procedure(s) Performed: LAPAROSCOPIC BILATERAL SALPINGECTOMY (Bilateral Abdomen)     Patient location during evaluation: PACU Anesthesia Type: General Level of consciousness: awake and alert Pain management: pain level controlled Vital Signs Assessment: post-procedure vital signs reviewed and stable Respiratory status: spontaneous breathing, nonlabored ventilation, respiratory function stable and patient connected to nasal cannula oxygen Cardiovascular status: blood pressure returned to baseline and stable Postop Assessment: no apparent nausea or vomiting Anesthetic complications: no    Last Vitals:  Vitals:   01/04/19 1615 01/04/19 1700  BP: (!) 126/96 (!) 131/92  Pulse: 86 73  Resp: 15 16  Temp: 36.5 C 36.7 C  SpO2: 91% 100%    Last Pain:  Vitals:   01/04/19 1700  TempSrc:   PainSc: 2                  Britni Driscoll L Tekia Waterbury

## 2019-01-11 ENCOUNTER — Telehealth: Payer: Self-pay | Admitting: General Practice

## 2019-01-11 NOTE — Telephone Encounter (Signed)
Pt requests TOC to you, please advise if ok to schedule pt

## 2019-01-11 NOTE — Telephone Encounter (Signed)
Message sent to Dr Banks CMA 

## 2019-01-11 NOTE — Telephone Encounter (Signed)
Fine with me. Thanks, BJ 

## 2019-01-11 NOTE — Telephone Encounter (Signed)
Patient would like to Transfer to Dr. Volanda Napoleon.

## 2019-01-11 NOTE — Telephone Encounter (Signed)
Waiting for Dr Doug Sou approval

## 2019-01-11 NOTE — Telephone Encounter (Signed)
ok 

## 2019-01-11 NOTE — Telephone Encounter (Signed)
Patient called saying that she wanted to do a transfer of care from Dr. Martin[place] to Dr. Volanda Napoleon. I see that there is a CRM in the patient chart but it didn't say that it was okay for the transfer. Please advise

## 2019-01-12 NOTE — Telephone Encounter (Signed)
Pt scheduled for I office TOC on 02/01/2019 at 2 pm

## 2019-02-01 ENCOUNTER — Ambulatory Visit: Payer: 59 | Admitting: Family Medicine

## 2019-02-01 ENCOUNTER — Encounter: Payer: Self-pay | Admitting: Family Medicine

## 2019-02-01 ENCOUNTER — Other Ambulatory Visit: Payer: Self-pay

## 2019-02-01 VITALS — BP 118/87 | HR 106 | Temp 98.0°F | Ht 62.0 in | Wt 180.0 lb

## 2019-02-01 DIAGNOSIS — I10 Essential (primary) hypertension: Secondary | ICD-10-CM

## 2019-02-01 MED ORDER — METOPROLOL SUCCINATE ER 25 MG PO TB24: 25.0000 mg | ORAL_TABLET | Freq: Every day | ORAL | 3 refills | Status: DC

## 2019-02-01 NOTE — Patient Instructions (Addendum)
Managing Your Hypertension Hypertension is commonly called high blood pressure. This is when the force of your blood pressing against the walls of your arteries is too strong. Arteries are blood vessels that carry blood from your heart throughout your body. Hypertension forces the heart to work harder to pump blood, and may cause the arteries to become narrow or stiff. Having untreated or uncontrolled hypertension can cause heart attack, stroke, kidney disease, and other problems. What are blood pressure readings? A blood pressure reading consists of a higher number over a lower number. Ideally, your blood pressure should be below 120/80. The first ("top") number is called the systolic pressure. It is a measure of the pressure in your arteries as your heart beats. The second ("bottom") number is called the diastolic pressure. It is a measure of the pressure in your arteries as the heart relaxes. What does my blood pressure reading mean? Blood pressure is classified into four stages. Based on your blood pressure reading, your health care provider may use the following stages to determine what type of treatment you need, if any. Systolic pressure and diastolic pressure are measured in a unit called mm Hg. Normal  Systolic pressure: below 120.  Diastolic pressure: below 80. Elevated  Systolic pressure: 120-129.  Diastolic pressure: below 80. Hypertension stage 1  Systolic pressure: 130-139.  Diastolic pressure: 80-89. Hypertension stage 2  Systolic pressure: 140 or above.  Diastolic pressure: 90 or above. What health risks are associated with hypertension? Managing your hypertension is an important responsibility. Uncontrolled hypertension can lead to:  A heart attack.  A stroke.  A weakened blood vessel (aneurysm).  Heart failure.  Kidney damage.  Eye damage.  Metabolic syndrome.  Memory and concentration problems. What changes can I make to manage my  hypertension? Hypertension can be managed by making lifestyle changes and possibly by taking medicines. Your health care provider will help you make a plan to bring your blood pressure within a normal range. Eating and drinking   Eat a diet that is high in fiber and potassium, and low in salt (sodium), added sugar, and fat. An example eating plan is called the DASH (Dietary Approaches to Stop Hypertension) diet. To eat this way: ? Eat plenty of fresh fruits and vegetables. Try to fill half of your plate at each meal with fruits and vegetables. ? Eat whole grains, such as whole wheat pasta, brown rice, or whole grain bread. Fill about one quarter of your plate with whole grains. ? Eat low-fat diary products. ? Avoid fatty cuts of meat, processed or cured meats, and poultry with skin. Fill about one quarter of your plate with lean proteins such as fish, chicken without skin, beans, eggs, and tofu. ? Avoid premade and processed foods. These tend to be higher in sodium, added sugar, and fat.  Reduce your daily sodium intake. Most people with hypertension should eat less than 1,500 mg of sodium a day.  Limit alcohol intake to no more than 1 drink a day for nonpregnant women and 2 drinks a day for men. One drink equals 12 oz of beer, 5 oz of wine, or 1 oz of hard liquor. Lifestyle  Work with your health care provider to maintain a healthy body weight, or to lose weight. Ask what an ideal weight is for you.  Get at least 30 minutes of exercise that causes your heart to beat faster (aerobic exercise) most days of the week. Activities may include walking, swimming, or biking.  Include exercise   to strengthen your muscles (resistance exercise), such as weight lifting, as part of your weekly exercise routine. Try to do these types of exercises for 30 minutes at least 3 days a week.  Do not use any products that contain nicotine or tobacco, such as cigarettes and e-cigarettes. If you need help quitting,  ask your health care provider.  Control any long-term (chronic) conditions you have, such as high cholesterol or diabetes. Monitoring  Monitor your blood pressure at home as told by your health care provider. Your personal target blood pressure may vary depending on your medical conditions, your age, and other factors.  Have your blood pressure checked regularly, as often as told by your health care provider. Working with your health care provider  Review all the medicines you take with your health care provider because there may be side effects or interactions.  Talk with your health care provider about your diet, exercise habits, and other lifestyle factors that may be contributing to hypertension.  Visit your health care provider regularly. Your health care provider can help you create and adjust your plan for managing hypertension. Will I need medicine to control my blood pressure? Your health care provider may prescribe medicine if lifestyle changes are not enough to get your blood pressure under control, and if:  Your systolic blood pressure is 130 or higher.  Your diastolic blood pressure is 80 or higher. Take medicines only as told by your health care provider. Follow the directions carefully. Blood pressure medicines must be taken as prescribed. The medicine does not work as well when you skip doses. Skipping doses also puts you at risk for problems. Contact a health care provider if:  You think you are having a reaction to medicines you have taken.  You have repeated (recurrent) headaches.  You feel dizzy.  You have swelling in your ankles.  You have trouble with your vision. Get help right away if:  You develop a severe headache or confusion.  You have unusual weakness or numbness, or you feel faint.  You have severe pain in your chest or abdomen.  You vomit repeatedly.  You have trouble breathing. Summary  Hypertension is when the force of blood pumping  through your arteries is too strong. If this condition is not controlled, it may put you at risk for serious complications.  Your personal target blood pressure may vary depending on your medical conditions, your age, and other factors. For most people, a normal blood pressure is less than 120/80.  Hypertension is managed by lifestyle changes, medicines, or both. Lifestyle changes include weight loss, eating a healthy, low-sodium diet, exercising more, and limiting alcohol. This information is not intended to replace advice given to you by your health care provider. Make sure you discuss any questions you have with your health care provider. Document Released: 03/16/2012 Document Revised: 10/14/2018 Document Reviewed: 05/20/2016 Elsevier Patient Education  2020 [employer] Inc[employer]  

## 2019-02-01 NOTE — Progress Notes (Signed)
Subjective:    Patient ID: Meredith Velez, female    DOB: February 21, 1986, 33 y.o.   MRN: [id number]  No chief complaint on file.   HPI Patient was seen today for f/u on HTN.  Pt was on metoprolol xl 100 mg prior to becoming pregnant.  Pt was switched to Procardia 120 mg, but states her bp was never really controlled.  Since delivery of her son in April pt notes continued elevation in bp.  BP recently 160/110.  Pt has been off bp meds x 1 wk.  Pt hoping to restart Toprol XL but advised to f/u with her pcp.  Pt denies HAs, changes in vision, CP.  Pt notes family h/o HTN (mom, dad, brother).  Pt busy at work and with her kids so having difficulty exercising.  Needs to drink more water.  Eating fast food often.  Past Medical History:  Diagnosis Date  . Anemia   . Anxiety   . Depression    pp depression  . Frequent headaches   . GERD (gastroesophageal reflux disease)   . Hypertension   . Migraines   . Seasonal allergies   . Ulcer, stomach peptic   . UTI (lower urinary tract infection)   . Wears glasses     No Known Allergies  ROS General: Denies fever, chills, night sweats, changes in weight, changes in appetite HEENT: Denies headaches, ear pain, changes in vision, rhinorrhea, sore throat CV: Denies CP, palpitations, SOB, orthopnea Pulm: Denies SOB, cough, wheezing GI: Denies abdominal pain, nausea, vomiting, diarrhea, constipation GU: Denies dysuria, hematuria, frequency, vaginal discharge Msk: Denies muscle cramps, joint pains Neuro: Denies weakness, numbness, tingling Skin: Denies rashes, bruising Psych: Denies depression, anxiety, hallucinations     Objective:    Blood pressure 120/90, pulse (!) 106, temperature 98 F (36.7 C), temperature source Oral, height 5\' 2"  (1.575 m), weight 180 lb (81.6 kg), SpO2 100 %, not currently breastfeeding.  Repeat bp 118/87   Gen. Pleasant, well-nourished, in no distress, normal affect  HEENT: [place]/AT, face symmetric, conjunctiva clear,  no scleral icterus, PERRLA, EOMI, nares patent without drainage Lungs: no accessory muscle use, CTAB, no wheezes or rales Cardiovascular: RRR, no m/r/g, no peripheral edema Neuro:  A&Ox3, CN II-XII intact, normal gait Skin:  Warm, no lesions/ rash   Wt Readings from Last 3 Encounters:  02/01/19 180 lb (81.6 kg)  01/04/19 175 lb 11.2 oz (79.7 kg)  10/20/18 190 lb 4.8 oz (86.3 kg)    Lab Results  Component Value Date   WBC 8.2 01/03/2019   HGB 12.8 01/03/2019   HCT 40.1 01/03/2019   PLT 402 (H) 01/03/2019   GLUCOSE 83 01/03/2019   CHOL 126 05/11/2014   TRIG 29.0 05/11/2014   HDL 43.10 05/11/2014   LDLCALC 77 05/11/2014   ALT 22 10/21/2018   AST 24 10/21/2018   NA 137 01/03/2019   K 4.2 01/03/2019   CL 106 01/03/2019   CREATININE 0.85 01/03/2019   BUN 19 01/03/2019   CO2 22 01/03/2019   TSH 2.26 01/03/2016    Assessment/Plan:  Essential hypertension  -discussed lifestyle modifications -pt encouraged to increase po intake of water -pt to check bp daily and keep a log to bring to clinic.  If bp consistently elevated >140/90 she is to start toprol xl - Plan: metoprolol succinate (TOPROL-XL) 25 MG 24 hr tablet  F/u in 2-4 wks  Grier Mitts, MD

## 2019-02-13 ENCOUNTER — Ambulatory Visit: Payer: Self-pay | Admitting: *Deleted

## 2019-02-13 ENCOUNTER — Ambulatory Visit: Payer: Self-pay

## 2019-02-13 NOTE — Telephone Encounter (Signed)
Incoming call from Patient with complaint of elevated b/p. 150/120 and 152/166 hr 96.  Recommended Patient go to [facility] care od ED.  Went to [facility].  Care would not see her r/t b/p values.  Stating there would be a hx.  Then they would refer he back to  Her PCP . Patient desires an appointment.  Provided care advice.    Reason for Disposition . [0] Systolic BP  >= 102 OR Diastolic >= 725 AND [3] missed most recent dose of blood pressure medication . Systolic BP  >= 664 OR Diastolic >= 403  Answer Assessment - Initial Assessment Questions 1. BLOOD PRESSURE: "What is the blood pressure?" "Did you take at least two measurements 5 minutes apart?"     150/120    152 116  96 2. ONSET: "When did you take your blood pressure?"     Early this morning and now 3. HOW: "How did you obtain the blood pressure?" (e.g., visiting nurse, automatic home BP monitor)     Automatic 4. HISTORY: "Do you have a history of high blood pressure?"     yes 5. MEDICATIONS: "Are you taking any medications for blood pressure?" "Have you missed any doses recently?"     denies 6. OTHER SYMPTOMS: "Do you have any symptoms?" (e.g., headache, chest pain, blurred vision, difficulty breathing, weakness)    denies 7. PREGNANCY: "Is there any chance you are pregnant?" "When was your last menstrual period?"  Protocols used: HIGH BLOOD PRESSURE-A-AH

## 2019-02-13 NOTE — Telephone Encounter (Signed)
Will monitor for ED Arrival 

## 2019-02-13 NOTE — Telephone Encounter (Signed)
See note

## 2019-02-13 NOTE — Telephone Encounter (Signed)
B/P 153/119, p. 96 bpm with mild (rates it a 3)SOB on exertion. Denies chest pain/weakness/unsteady gail/vision change today.Admits to SOB while sitting with breakfast this morning. B/P yesterday elevated with chest discomfort for a short time. She took prilosec it relieved the pain. Takes toprol-xl 25 mg every 24 hours. Yesterday, she took an additional 75 mg of the medication. 4:30am today B/P 159/118. Advised ED at this time. Stated she may not not go and will monitor pressure and sxs. Reviewed all urgent symptoms with patient that need immediate evaluation. Stated she understood. Routing to PCP to monitor for ED visit.  Reason for Disposition . [0] Systolic BP  >= 630 OR Diastolic >= 160 AND [1] cardiac or neurologic symptoms (e.g., chest pain, difficulty breathing, unsteady gait, blurred vision)  Answer Assessment - Initial Assessment Questions 1. BLOOD PRESSURE: "What is the blood pressure?" "Did you take at least two measurements 5 minutes apart?"      Now 153/119 p. 96   2. ONSET: "When did you take your blood pressure?"     now 3. HOW: "How did you obtain the blood pressure?" (e.g., visiting nurse, automatic home BP monitor)     Home monitor 4. HISTORY: "Do you have a history of high blood pressure?"     yes 5. MEDICATIONS: "Are you taking any medications for blood pressure?" "Have you missed any doses recently?"     On medication and taking daily 6. OTHER SYMPTOMS: "Do you have any symptoms?" (e.g., headache, chest pain, blurred vision, difficulty breathing, weakness)     Started noticing SOB and chest discomfort 7. PREGNANCY: "Is there any chance you are pregnant?" "When was your last menstrual period?"     no  Protocols used: HIGH BLOOD PRESSURE-A-AH

## 2019-02-14 ENCOUNTER — Ambulatory Visit (HOSPITAL_COMMUNITY): Payer: 59

## 2019-02-14 ENCOUNTER — Other Ambulatory Visit: Payer: Self-pay

## 2019-02-14 ENCOUNTER — Emergency Department (HOSPITAL_COMMUNITY): Payer: 59

## 2019-02-14 ENCOUNTER — Inpatient Hospital Stay (HOSPITAL_COMMUNITY)
Admission: EM | Admit: 2019-02-14 | Discharge: 2019-02-15 | DRG: 776 | Disposition: A | Discharge status: Home / Self Care | Payer: 59 | Attending: Cardiology | Admitting: Cardiology

## 2019-02-14 ENCOUNTER — Encounter (HOSPITAL_COMMUNITY): Payer: Self-pay | Admitting: Emergency Medicine

## 2019-02-14 DIAGNOSIS — F419 Anxiety disorder, unspecified: Secondary | ICD-10-CM | POA: Diagnosis present

## 2019-02-14 DIAGNOSIS — O903 Peripartum cardiomyopathy: Principal | ICD-10-CM | POA: Diagnosis present

## 2019-02-14 DIAGNOSIS — Z833 Family history of diabetes mellitus: Secondary | ICD-10-CM

## 2019-02-14 DIAGNOSIS — I493 Ventricular premature depolarization: Secondary | ICD-10-CM | POA: Diagnosis present

## 2019-02-14 DIAGNOSIS — Z841 Family history of disorders of kidney and ureter: Secondary | ICD-10-CM

## 2019-02-14 DIAGNOSIS — Z8249 Family history of ischemic heart disease and other diseases of the circulatory system: Secondary | ICD-10-CM | POA: Diagnosis not present

## 2019-02-14 DIAGNOSIS — Z801 Family history of malignant neoplasm of trachea, bronchus and lung: Secondary | ICD-10-CM | POA: Diagnosis not present

## 2019-02-14 DIAGNOSIS — Z20828 Contact with and (suspected) exposure to other viral communicable diseases: Secondary | ICD-10-CM | POA: Diagnosis present

## 2019-02-14 DIAGNOSIS — I361 Nonrheumatic tricuspid (valve) insufficiency: Secondary | ICD-10-CM

## 2019-02-14 DIAGNOSIS — I5021 Acute systolic (congestive) heart failure: Secondary | ICD-10-CM | POA: Diagnosis present

## 2019-02-14 DIAGNOSIS — K219 Gastro-esophageal reflux disease without esophagitis: Secondary | ICD-10-CM | POA: Diagnosis present

## 2019-02-14 DIAGNOSIS — Z823 Family history of stroke: Secondary | ICD-10-CM | POA: Diagnosis not present

## 2019-02-14 DIAGNOSIS — Z820 Family history of epilepsy and other diseases of the nervous system: Secondary | ICD-10-CM | POA: Diagnosis not present

## 2019-02-14 DIAGNOSIS — Z803 Family history of malignant neoplasm of breast: Secondary | ICD-10-CM | POA: Diagnosis not present

## 2019-02-14 DIAGNOSIS — I110 Hypertensive heart disease with heart failure: Secondary | ICD-10-CM | POA: Diagnosis present

## 2019-02-14 DIAGNOSIS — Z8349 Family history of other endocrine, nutritional and metabolic diseases: Secondary | ICD-10-CM

## 2019-02-14 DIAGNOSIS — F329 Major depressive disorder, single episode, unspecified: Secondary | ICD-10-CM | POA: Diagnosis present

## 2019-02-14 DIAGNOSIS — Z791 Long term (current) use of non-steroidal anti-inflammatories (NSAID): Secondary | ICD-10-CM

## 2019-02-14 DIAGNOSIS — I5022 Chronic systolic (congestive) heart failure: Secondary | ICD-10-CM | POA: Diagnosis present

## 2019-02-14 DIAGNOSIS — I340 Nonrheumatic mitral (valve) insufficiency: Secondary | ICD-10-CM

## 2019-02-14 DIAGNOSIS — Z8711 Personal history of peptic ulcer disease: Secondary | ICD-10-CM | POA: Diagnosis not present

## 2019-02-14 DIAGNOSIS — Z79899 Other long term (current) drug therapy: Secondary | ICD-10-CM

## 2019-02-14 DIAGNOSIS — I444 Left anterior fascicular block: Secondary | ICD-10-CM | POA: Diagnosis present

## 2019-02-14 DIAGNOSIS — Z90722 Acquired absence of ovaries, bilateral: Secondary | ICD-10-CM

## 2019-02-14 DIAGNOSIS — I10 Essential (primary) hypertension: Secondary | ICD-10-CM | POA: Diagnosis present

## 2019-02-14 HISTORY — DX: Peripartum cardiomyopathy: O90.3

## 2019-02-14 LAB — BASIC METABOLIC PANEL
Anion gap: 10 (ref 5–15)
BUN: 17 mg/dL (ref 6–20)
CO2: 23 mmol/L (ref 22–32)
Calcium: 9.1 mg/dL (ref 8.9–10.3)
Chloride: 104 mmol/L (ref 98–111)
Creatinine, Ser: 1.1 mg/dL — ABNORMAL HIGH (ref 0.44–1.00)
GFR calc Af Amer: >60 mL/min (ref >60)
GFR calc non Af Amer: >60 mL/min (ref >60)
Glucose, Bld: 90 mg/dL (ref 70–99)
Potassium: 3.9 mmol/L (ref 3.5–5.1)
Sodium: 137 mmol/L (ref 135–145)

## 2019-02-14 LAB — BRAIN NATRIURETIC PEPTIDE: B Natriuretic Peptide: 826.8 pg/mL — ABNORMAL HIGH (ref 0.0–100.0)

## 2019-02-14 LAB — CBC WITH DIFFERENTIAL/PLATELET
Abs Immature Granulocytes: 0.02 10*3/uL (ref 0.00–0.07)
Basophils Absolute: 0 10*3/uL (ref 0.0–0.1)
Basophils Relative: 0 %
Eosinophils Absolute: 0.2 10*3/uL (ref 0.0–0.5)
Eosinophils Relative: 3 %
HCT: 38.6 % (ref 36.0–46.0)
Hemoglobin: 12.4 g/dL (ref 12.0–15.0)
Immature Granulocytes: 0 %
Lymphocytes Relative: 36 %
Lymphs Abs: 2.4 10*3/uL (ref 0.7–4.0)
MCH: 28.6 pg (ref 26.0–34.0)
MCHC: 32.1 g/dL (ref 30.0–36.0)
MCV: 89.1 fL (ref 80.0–100.0)
Monocytes Absolute: 0.4 10*3/uL (ref 0.1–1.0)
Monocytes Relative: 6 %
Neutro Abs: 3.7 10*3/uL (ref 1.7–7.7)
Neutrophils Relative %: 55 %
Platelets: 385 10*3/uL (ref 150–400)
RBC: 4.33 MIL/uL (ref 3.87–5.11)
RDW: 13.6 % (ref 11.5–15.5)
WBC: 6.7 10*3/uL (ref 4.0–10.5)
nRBC: 0 % (ref 0.0–0.2)

## 2019-02-14 LAB — D-DIMER, QUANTITATIVE: D-Dimer, Quant: 0.43 ug/mL-FEU (ref 0.00–0.50)

## 2019-02-14 LAB — TROPONIN I (HIGH SENSITIVITY): Troponin I (High Sensitivity): 6 ng/L (ref <18)

## 2019-02-14 LAB — TSH: TSH: 1.533 u[IU]/mL (ref 0.350–4.500)

## 2019-02-14 LAB — HCG, QUANTITATIVE, PREGNANCY: hCG, Beta Chain, Quant, S: 1 m[IU]/mL (ref <5)

## 2019-02-14 LAB — ECHOCARDIOGRAM COMPLETE

## 2019-02-14 MED ORDER — ENOXAPARIN SODIUM 40 MG/0.4ML ~~LOC~~ SOLN: 40.0000 mg | SUBCUTANEOUS | Status: DC

## 2019-02-14 MED ORDER — METOPROLOL SUCCINATE ER 25 MG PO TB24
25.0000 mg | ORAL_TABLET | Freq: Two times a day (BID) | ORAL | Status: DC
  Administered 2019-02-14 – 2019-02-15 (×2): 25 mg via ORAL
  Filled 2019-02-14 (×2): qty 1

## 2019-02-14 MED ORDER — SODIUM CHLORIDE 0.9% FLUSH: 3.0000 mL | INTRAVENOUS | Status: DC | PRN

## 2019-02-14 MED ORDER — ZOLPIDEM TARTRATE 5 MG PO TABS: 5.0000 mg | ORAL_TABLET | Freq: Every evening | ORAL | Status: DC | PRN

## 2019-02-14 MED ORDER — ONDANSETRON HCL 4 MG/2ML IJ SOLN: 4.0000 mg | Freq: Four times a day (QID) | INTRAMUSCULAR | Status: DC | PRN

## 2019-02-14 MED ORDER — ACETAMINOPHEN 500 MG PO TABS
1000.0000 mg | ORAL_TABLET | Freq: Once | ORAL | Status: AC
  Administered 2019-02-14: 1000 mg via ORAL
  Filled 2019-02-14: qty 2

## 2019-02-14 MED ORDER — ESCITALOPRAM OXALATE 10 MG PO TABS
20.0000 mg | ORAL_TABLET | Freq: Every day | ORAL | Status: DC
  Administered 2019-02-15: 20 mg via ORAL
  Filled 2019-02-14 (×2): qty 2

## 2019-02-14 MED ORDER — FUROSEMIDE 10 MG/ML IJ SOLN
20.0000 mg | Freq: Two times a day (BID) | INTRAMUSCULAR | Status: DC
  Administered 2019-02-14: 20 mg via INTRAVENOUS
  Filled 2019-02-14: qty 2

## 2019-02-14 MED ORDER — SODIUM CHLORIDE 0.9 % IV SOLN: 250.0000 mL | INTRAVENOUS | Status: DC | PRN

## 2019-02-14 MED ORDER — HYDROXYZINE HCL 25 MG PO TABS
25.0000 mg | ORAL_TABLET | Freq: Once | ORAL | Status: AC
  Administered 2019-02-14: 25 mg via ORAL
  Filled 2019-02-14: qty 1

## 2019-02-14 MED ORDER — ACETAMINOPHEN 325 MG PO TABS: 650.0000 mg | ORAL_TABLET | ORAL | Status: DC | PRN

## 2019-02-14 MED ORDER — NITROGLYCERIN 0.4 MG SL SUBL: 0.4000 mg | SUBLINGUAL_TABLET | SUBLINGUAL | Status: DC | PRN

## 2019-02-14 MED ORDER — FUROSEMIDE 20 MG PO TABS
40.0000 mg | ORAL_TABLET | Freq: Once | ORAL | Status: AC
  Administered 2019-02-14: 40 mg via ORAL
  Filled 2019-02-14: qty 2

## 2019-02-14 MED ORDER — FLUTICASONE PROPIONATE 50 MCG/ACT NA SUSP
2.0000 | NASAL | Status: DC | PRN
  Filled 2019-02-14: qty 16

## 2019-02-14 MED ORDER — SODIUM CHLORIDE 0.9% FLUSH
3.0000 mL | Freq: Two times a day (BID) | INTRAVENOUS | Status: DC
  Administered 2019-02-14 – 2019-02-15 (×2): 3 mL via INTRAVENOUS

## 2019-02-14 MED ORDER — METOPROLOL TARTRATE 5 MG/5ML IV SOLN
2.5000 mg | INTRAVENOUS | Status: DC | PRN
  Filled 2019-02-14: qty 5

## 2019-02-14 MED ORDER — ALPRAZOLAM 0.25 MG PO TABS: 0.2500 mg | ORAL_TABLET | Freq: Two times a day (BID) | ORAL | Status: DC | PRN

## 2019-02-14 MED ORDER — SACUBITRIL-VALSARTAN 24-26 MG PO TABS
1.0000 | ORAL_TABLET | Freq: Two times a day (BID) | ORAL | Status: DC
  Administered 2019-02-14: 1 via ORAL
  Filled 2019-02-14: qty 1

## 2019-02-14 NOTE — Telephone Encounter (Signed)
Patient is at ED

## 2019-02-14 NOTE — Telephone Encounter (Signed)
See note

## 2019-02-14 NOTE — ED Triage Notes (Signed)
Pt arrives via POV from home with c/o waking with headache this morning and checked her blood pressure noted to be elevated 160s per patient. Did not take bp meds today. Pt also reports bp systolic in 366Y yesterday, took extra of metoprolol and resolved to 403K systolic last night before bed. Pt reports some chest tightness/SOB and anxiety over the weekend. States takes lorazepam as needed for anxiety. Pt awake, alert, appropriate at present. VSS.

## 2019-02-14 NOTE — ED Notes (Signed)
To xray with transporter.  

## 2019-02-14 NOTE — Progress Notes (Signed)
*  PRELIMINARY RESULTS* Echocardiogram 2D Echocardiogram has been performed.  Dustin Flock 02/14/2019, 2:25 PM

## 2019-02-14 NOTE — ED Notes (Signed)
[facility] refused report.

## 2019-02-14 NOTE — ED Provider Notes (Signed)
[facility] Provider Note   CSN: [id number] Arrival date & time: 02/14/19  [id number]    History   Chief Complaint Chief Complaint  Patient presents with  . Hypertension  . Headache  . Shortness of Breath    HPI Meredith Velez is a 33 y.o. female.     HPI Patient presents for evaluation of multiple complaints.  Her primary concern is that she is had worsening hypertension over the last few days with systolic pressures as high as 119J170s.  She has known essential hypertension and her PCP has been adjusting her medications as she just had a baby 4 months ago.  She is concerned that her hypertension can be dangerous and this is causing her some anxiety.  Reports adherence to her medication regimen.  She also states that she has had several weeks of intermittent achy retro-orbital headache.  No clear alleviating or exacerbating factors.  Usually last for hours.  Symptoms lasted all day.  She does endorse poor hydration and poor sleep schedule with the new infant.  No other associated neurologic symptoms.  Does wear glasses as needed.  Has not had prescription updated since delivery.  She also reports that over the last 2 days that she has had some shortness of breath.  She is fine at rest but with exertion including going up stairs or walking across parking lot, she has to pause.  This is unusual for her.  Sometimes associated with feeling of chest tightness.  No orthopnea or lower extremity edema.  No cough.  Past Medical History:  Diagnosis Date  . Anemia   . Anxiety   . Depression    pp depression  . Frequent headaches   . GERD (gastroesophageal reflux disease)   . Hypertension   . Migraines   . 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 Seasonal allergies   . Ulcer, stomach peptic   . UTI (lower urinary tract infection)   . Wears glasses     Patient Active Problem List   Diagnosis Date Noted  . 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  Postpartum anemia 10/23/2018  . Normal postpartum course 10/23/2018  . Preterm delivery 10/23/2018  . IUGR (intrauterine growth restriction) affecting care of mother, third trimester, not applicable or unspecified fetus 10/20/2018  . Chronic hypertension affecting pregnancy 09/28/2018  . Short cervical length during pregnancy in third trimester 09/28/2018  . Postpartum depression 09/28/2018  . Anemia of pregnancy 08/06/2018  . Dichorionic diamniotic twin pregnancy in third trimester 05/28/2018  . Pregnant 05/09/2018  . History of peptic ulcer 05/06/2018  . Migraine headache without aura 02/28/2016  . Allergic rhinitis 02/28/2016  . GERD (gastroesophageal reflux disease) 07/12/2014  . Essential hypertension 02/21/2014    Past Surgical History:  Procedure Laterality Date  . COLONOSCOPY    . LAPAROSCOPIC BILATERAL SALPINGECTOMY Bilateral 01/04/2019   Procedure: LAPAROSCOPIC BILATERAL SALPINGECTOMY;  Surgeon: Osborn Cohooberts, Angela, MD;  [facility];  Service: Gynecology;  Laterality: Bilateral;     OB History    Gravida  2   Para  2   Term  1   Preterm  1   AB  0   Living  2     SAB  0   TAB  0   Ectopic  0   Multiple  0   Live Births  2            Home Medications    Prior to Admission medications   Medication Sig  Start Date End Date Taking? Authorizing Provider  escitalopram (LEXAPRO) 10 MG tablet Take 20 mg by mouth daily.    Yes [provider]  fluticasone (FLONASE) 50 MCG/ACT nasal spray Place 2 sprays into both nostrils as needed for allergies or rhinitis.    Yes [provider]  metoprolol succinate (TOPROL-XL) 25 MG 24 hr tablet Take 1 tablet (25 mg total) by mouth daily. 02/01/19  Yes Deeann Saint, MD  acetaminophen (TYLENOL) 325 MG tablet Take 2 tablets (650 mg total) by mouth every 4 (four) hours as needed (for pain scale < 4). Patient not taking: Reported on 02/01/2019 10/23/18   Dale Ruston, FNP    Family  History Family History  Problem Relation Age of Onset  . Hypertension Mother   . Hyperlipidemia Mother   . Heart disease Mother   . Kidney disease Mother   . Diabetes Mother   . Hypertension Father   . Stroke Father   . Diabetes Father   . Hypertension Brother   . Breast cancer Maternal Aunt   . Lung cancer Maternal Uncle   . Prostate cancer Maternal Grandmother   . Prostate cancer Maternal Grandfather   . Alzheimer's disease Paternal Grandmother     Social History Social History   Tobacco Use  . Smoking status: Never Smoker  . Smokeless tobacco: Never Used  Substance Use Topics  . Alcohol use: Yes    Comment: occ  . Drug use: No     Allergies   Patient has no known allergies.   Review of Systems Review of Systems  Respiratory: Positive for chest tightness and shortness of breath.   Neurological: Positive for headaches.  Psychiatric/Behavioral: The patient is nervous/anxious.   All other systems reviewed and are negative.    Physical Exam Updated Vital Signs BP (!) 152/114   Pulse 72   Temp 97.9 F (36.6 C) (Oral)   Resp 18   SpO2 98%   Physical Exam Vitals signs and nursing note reviewed.  Constitutional:      General: She is not in acute distress.    Appearance: She is well-developed.  HENT:     Head: Normocephalic and atraumatic.  Eyes:     Conjunctiva/sclera: Conjunctivae normal.  Neck:     Musculoskeletal: Neck supple.  Cardiovascular:     Rate and Rhythm: Normal rate and regular rhythm.     Heart sounds: No murmur.     Comments: Splitting of S2 over pulmonic  Pulmonary:     Effort: Pulmonary effort is normal. No respiratory distress.     Breath sounds: Normal breath sounds.  Abdominal:     Palpations: Abdomen is soft.     Tenderness: There is no abdominal tenderness.  Skin:    General: Skin is warm and dry.  Neurological:     Mental Status: She is alert.  Psychiatric:        Mood and Affect: Mood is anxious.      ED Treatments  / Results  Labs (all labs ordered are listed, but only abnormal results are displayed) Labs Reviewed  BASIC METABOLIC PANEL - Abnormal; Notable for the following components:      Result Value   Creatinine, Ser 1.10 (*)    All other components within normal limits  BRAIN NATRIURETIC PEPTIDE - Abnormal; Notable for the following components:   B Natriuretic Peptide 826.8 (*)    All other components within normal limits  CBC WITH DIFFERENTIAL/PLATELET  D-DIMER, QUANTITATIVE (NOT AT [facility])  HCG, QUANTITATIVE, PREGNANCY  TROPONIN I (HIGH SENSITIVITY)    EKG EKG Interpretation  Date/Time:  Tuesday February 14 2019 08:37:25 EDT Ventricular Rate:  87 PR Interval:    QRS Duration: 82 QT Interval:  412 QTC Calculation: 496 R Axis:   -41 Text Interpretation:  Sinus rhythm Probable left atrial enlargement Left anterior fascicular block Anterior infarct, old Prolonged QT interval T wave inversion AVL new since June 2020 Confirmed by Pricilla LovelessGoldston, Scott [phone] on 02/14/2019 8:49:16 AM Also confirmed by Pricilla LovelessGoldston, Scott [phone]), editor Barbette Hairassel, Kerry [phone])  on 02/14/2019 9:04:03 AM   Radiology Dg Chest 2 View  Result Date: 02/14/2019 CLINICAL DATA:  Chest discomfort and shortness of breath with exertion. Four months postpartum. EXAM: CHEST - 2 VIEW COMPARISON:  09/29/2013 FINDINGS: The heart size and mediastinal contours are within normal limits. Both lungs are clear. The visualized skeletal structures are unremarkable. IMPRESSION: No active cardiopulmonary disease. Electronically Signed   By: Danae OrleansJohn A Stahl M.D.   On: 02/14/2019 09:48    Procedures Ultrasound ED Echo  Date/Time: 02/14/2019 4:52 PM Performed by: Jaclynn MajorStarnes, Laurencia Roma, MD Authorized by: Pricilla LovelessGoldston, Scott, MD   Procedure details:    Indications: dyspnea     Views: subxiphoid, parasternal long axis view, parasternal short axis view, apical 4 chamber view and IVC view     Images: archived   Findings:    Pericardium: no pericardial effusion      LV Function: severly depressed (<30%)     RV Diameter: normal     IVC: normal   Impression:    Impression: decreased contractility     Impression comment:  Dilated cardiomyopathy of the left ventricle with severely reduced ejection fraction   (including critical care time)  Medications Ordered in ED Medications  metoprolol tartrate (LOPRESSOR) injection 2.5 mg (has no administration in time range)  metoprolol succinate (TOPROL-XL) 24 hr tablet 25 mg (25 mg Oral Given 02/14/19 1622)  furosemide (LASIX) injection 20 mg (has no administration in time range)  escitalopram (LEXAPRO) tablet 20 mg (has no administration in time range)  fluticasone (FLONASE) 50 MCG/ACT nasal spray 2 spray (has no administration in time range)  sacubitril-valsartan (ENTRESTO) 24-26 mg per tablet (has no administration in time range)  acetaminophen (TYLENOL) tablet 1,000 mg (1,000 mg Oral Given 02/14/19 0912)  hydrOXYzine (ATARAX/VISTARIL) tablet 25 mg (25 mg Oral Given 02/14/19 0912)  furosemide (LASIX) tablet 40 mg (40 mg Oral Given 02/14/19 1535)     Initial Impression / Assessment and Plan / ED Course  I have reviewed the triage vital signs and the nursing notes.  Pertinent labs & imaging results that were available during my care of the patient were reviewed by me and considered in my medical decision making (see chart for details).       Meredith Velez is a 33 year old female who is 4 weeks postpartum from an uncomplicated spontaneous vaginal delivery.  She has 2 children.  Also has medical history of essential hypertension, postpartum depression, postpartum anemia, GERD, and migraine.  Here today for headache, hypertension, and shortness of breath as above.  Given her complaint of shortness of breath in the setting of postpartum period, included BNP and work-up which was elevated.  Troponin is normal.  Chest x-ray was not suggestive of pulmonary edema, and she did not appear volume overloaded on exam.  I  performed bedside ultrasound as above which was notable for left ventricle dilation and reduced ejection fraction.  Initiated diuresis with 40 mg p.o. Lasix, consulted cardiology service,  and obtained formal echo which confirmed ejection fraction of 20 to 25%.  They evaluated the patient at bedside and recommended admission for further evaluation and treatment.  Final Clinical Impressions(s) / ED Diagnoses   Final diagnoses:  Essential hypertension  Postpartum dilated cardiomyopathy    ED Discharge Orders    None       Tillie Fantasia, MD 02/14/19 1653    Sherwood Gambler, MD 02/15/19 [phone]

## 2019-02-14 NOTE — ED Notes (Addendum)
Attempted report again x3 to [place] up phone

## 2019-02-14 NOTE — H&P (Addendum)
Cardiology History and Physical:   Patient ID: Meredith Velez; [id number]; [birth year]   Admit date: 02/14/2019 Date of Consult: 02/14/2019  Primary Care Provider: Deeann Saint, MD Primary Cardiologist: Donato Schultz, MD New Primary Electrophysiologist:  None   Patient Profile:   Lizette Pazos is a 33 y.o. female with a hx of pre-eclampsia s/p vag delivery 10/20/2018 of 4 lb 12 oz baby at 64 w 4 d, HTN, PUD, anemia, anxiety/depression, migraines, who is being seen today for the evaluation of possible post-partum CM at the request of Dr Criss Alvine.  History of Present Illness:   Ms. Carneiro had labor induced and spontaneous vag delivery of a baby boy at 22 1/[redacted] weeks gestation on 04/16. D/c w/out complications on 04/19.  6/18, had outpt lap BTL and tolerated well. No S&S of volume overload.  07/29 PCP visit for HTN. BP ok in office, keep log and start Toprol XL 25 mg qd if >140/90.   08/10 phone notes regarding chest pain and SOB, elevated BP. CP relieved by Prilosec, taking additional BB (up to 75 mg Toprol XL) without good control of her BP (153/119)>> she was advised to go to the ER but pt did not go in till 08/11.  In ER, sx concerning for post-partum CM>>Cards asked to see.   Ms. Leder noted a dramatic change in her condition this past weekend.  She felt that she was doing well after the delivery and somewhat tired because of taking care of an infant, but otherwise doing okay.  On Sunday morning, she woke up with dyspnea on exertion.  She was unable to climb the stairs in the home without stopping.  She would get noticeably short of breath and have to stop and rest while walking around the grocery store.  She was not short of breath at rest, but felt like she could not do anything without getting short of breath.  She was struggling taking care of the kids.  In the emergency room, she feels a little bit better, but is noticeably short of breath moving herself around in the  bed.  She has had minimal lower extremity edema since initial recovering from the pregnancy, and describes orthopnea over the last couple of nights, but denies PND.  The chest pain that she had started after the shortness of breath.  Would get worse with exertion, but is also worse with deep inspiration.  She feels like the Prilosec helped considerably.  She has still had it when she has to breathe harder or faster because she is exerting herself.  However, she has chest wall tenderness.  This past baby was her second pregnancy.  She has 1 child that is 92 years old.  She had some hypertension towards the end of that pregnancy, but did not have preeclampsia.  Prior to becoming pregnant, her blood pressure was well controlled on metoprolol.  The Procardia did not do as good a job, but was okay until the end of the pregnancy when she started getting preeclampsia and her blood pressure stayed high.  She is no longer breast-feeding.  She has not had blood pressure medication today.  She had a headache when she got here, expresses relief that her headache is improved.   Past Medical History:  Diagnosis Date  . Anemia   . Anxiety   . Depression    pp depression  . Frequent headaches   . GERD (gastroesophageal reflux disease)   . Hypertension   . Migraines   .  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 Seasonal allergies   . Ulcer, stomach peptic   . UTI (lower urinary tract infection)   . Wears glasses     Past Surgical History:  Procedure Laterality Date  . COLONOSCOPY    . LAPAROSCOPIC BILATERAL SALPINGECTOMY Bilateral 01/04/2019   Procedure: LAPAROSCOPIC BILATERAL SALPINGECTOMY;  Surgeon: Everett Graff, MD;  Location: [facility];  Service: Gynecology;  Laterality: Bilateral;     Prior to Admission medications   Medication Sig Start Date End Date Taking? Authorizing Provider  escitalopram (LEXAPRO) 10 MG tablet Take 20 mg by mouth daily.    Yes [provider]   fluticasone (FLONASE) 50 MCG/ACT nasal spray Place 2 sprays into both nostrils as needed for allergies or rhinitis.    Yes [provider]  metoprolol succinate (TOPROL-XL) 25 MG 24 hr tablet Take 1 tablet (25 mg total) by mouth daily. 02/01/19  Yes Billie Ruddy, MD  acetaminophen (TYLENOL) 325 MG tablet Take 2 tablets (650 mg total) by mouth every 4 (four) hours as needed (for pain scale < 4). Patient not taking: Reported on 02/01/2019 10/23/18   Noralyn Pick, FNP  ibuprofen (ADVIL) 600 MG tablet Take 1 tablet (600 mg total) by mouth every 6 (six) hours as needed. Patient not taking: Reported on 02/01/2019 01/04/19   Everett Graff, MD  NIFEdipine (PROCARDIA-XL/NIFEDICAL-XL) 30 MG 24 hr tablet Take 3 tablets (90 mg total) by mouth daily. Patient not taking: Reported on 02/01/2019 10/23/18 10/23/19  Noralyn Pick, FNP    Inpatient Medications: Scheduled Meds:  Continuous Infusions:  PRN Meds:   Allergies:   No Known Allergies  Social History:   Social History   Socioeconomic History  . Marital status: Married    Spouse name: Not on file  . Number of children: Not on file  . Years of education: Not on file  . Highest education level: Not on file  Occupational History  . Occupation: Building surveyor: [employer]  . Financial resource strain: Not on file  . Food insecurity    Worry: Not on file    Inability: Not on file  . Transportation needs    Medical: Not on file    Non-medical: Not on file  Tobacco Use  . Smoking status: Never Smoker  . Smokeless tobacco: Never Used  Substance and Sexual Activity  . Alcohol use: Yes    Comment: occ  . Drug use: No  . Sexual activity: Yes    Birth control/protection: None  Lifestyle  . Physical activity    Days per week: Not on file    Minutes per session: Not on file  . Stress: Not on file  Relationships  . Social Herbalist on phone: Not on file    Gets together: Not on file     Attends religious service: Not on file    Active member of club or organization: Not on file    Attends meetings of clubs or organizations: Not on file    Relationship status: Not on file  . Intimate partner violence    Fear of current or ex partner: Not on file    Emotionally abused: Not on file    Physically abused: Not on file    Forced sexual activity: Not on file  Other Topics Concern  . Not on file  Social History Narrative  . Not on file    Family History:   Family History  Problem Relation Age of Onset  . Hypertension Mother   . Hyperlipidemia Mother   . Heart disease Mother   . Kidney disease Mother   . Diabetes Mother   . Hypertension Father   . Stroke Father   . Diabetes Father   . Hypertension Brother   . Breast cancer Maternal Aunt   . Lung cancer Maternal Uncle   . Prostate cancer Maternal Grandmother   . Prostate cancer Maternal Grandfather   . Alzheimer's disease Paternal Grandmother    Family Status:  Family Status  Relation Name Status  . Mother  Alive  . Father  Deceased  . Brother  (Not Specified)  . Mat Aunt  (Not Specified)  . Mat Uncle  (Not Specified)  . MGM  (Not Specified)  . MGF  (Not Specified)  . PGM  (Not Specified)    ROS:  Please see the history of present illness.  All other ROS reviewed and negative.     Physical Exam/Data:   Vitals:   02/14/19 1215 02/14/19 1230 02/14/19 1330 02/14/19 1530  BP: (!) 126/97 (!) 132/100 (!) 130/92 (!) 152/114  Pulse: 70 77 71 72  Resp: 13 19 18 18   Temp:      TempSrc:      SpO2: 100% 100% 99% 98%   No intake or output data in the 24 hours ending 02/14/19 1541 There were no vitals filed for this visit. There is no height or weight on file to calculate BMI.  General:  Well nourished, well developed, in no acute distress HEENT: normal Lymph: no adenopathy Neck: Minimal JVD Endocrine:  No thryomegaly Vascular: No carotid bruits; 4/4 extremity pulses 2+  Cardiac:  normal S1, S2; RRR;  no murmur  Lungs: Decreased breath sounds bases bilaterally, no wheezing, rhonchi or rales  Abd: soft, nontender, no hepatomegaly, mildly positive hepatojugular reflux Ext: Trace lower extremity edema Musculoskeletal:  No deformities, BUE and BLE strength normal and equal Skin: warm and dry  Neuro:  CNs 2-12 intact, no focal abnormalities noted Psych:  Normal affect   EKG:  The EKG was personally reviewed and demonstrates: 8/11 ECG is sinus rhythm, heart rate 87, flattened T waves in leads I and V1, T wave inversion in aVL are all new since 01/03/2019 Telemetry:  Telemetry was personally reviewed and demonstrates: Sinus rhythm  Relevant CV Studies:  ECHO: 02/14/2019  1. The left ventricle has severely reduced systolic function, with an ejection fraction of 20-25%. The cavity size was severely dilated. Left ventricular diastolic Doppler parameters are consistent with pseudonormalization. Elevated left ventricular  end-diastolic pressure.  2. The right ventricle has normal systolic function. The cavity was normal. There is no increase in right ventricular wall thickness.  3. Mild thickening of the mitral valve leaflet. Mild calcification of the mitral valve leaflet.  4. The aortic valve is tricuspid. Mild thickening of the aortic valve. Mild calcification of the aortic valve.  5. The aorta is normal in size and structure.  6. The interatrial septum was not well visualized. 7.  RVSP is 8.94 cm/s   Laboratory Data:  Chemistry Recent Labs  Lab 02/14/19 0914  NA 137  K 3.9  CL 104  CO2 23  GLUCOSE 90  BUN 17  CREATININE 1.10*  CALCIUM 9.1  GFRNONAA >60  GFRAA >60  ANIONGAP 10    Lab Results  Component Value Date   ALT 22 10/21/2018   AST 24 10/21/2018   ALKPHOS 115 10/21/2018   BILITOT 0.4  10/21/2018   Hematology Recent Labs  Lab 02/14/19 0914  WBC 6.7  RBC 4.33  HGB 12.4  HCT 38.6  MCV 89.1  MCH 28.6  MCHC 32.1  RDW 13.6  PLT 385   Cardiac Enzymes High  Sensitivity Troponin:   Recent Labs  Lab 02/14/19 0914  TROPONINIHS 6      BNP Recent Labs  Lab 02/14/19 0915  BNP 826.8*    DDimer  Recent Labs  Lab 02/14/19 0914  DDIMER 0.43   TSH:  Lab Results  Component Value Date   TSH 2.26 01/03/2016   Lipids: Lab Results  Component Value Date   CHOL 126 05/11/2014   HDL 43.10 05/11/2014   LDLCALC 77 05/11/2014   TRIG 29.0 05/11/2014   CHOLHDL 3 05/11/2014   HgbA1c:No results found for: HGBA1C Magnesium:  Magnesium  Date Value Ref Range Status  10/21/2018 6.2 (HH) 1.7 - 2.4 mg/dL Final    Comment:    CRITICAL RESULT CALLED TO, READ BACK BY AND VERIFIED WITH: HOPPENBAUER,M RN @1406  ON [id number] BY FLEMINGS Performed at [facility], [address] [place], [place] [address]      Radiology/Studies:  Dg Chest 2 View  Result Date: 02/14/2019 CLINICAL DATA:  Chest discomfort and shortness of breath with exertion. Four months postpartum. EXAM: CHEST - 2 VIEW COMPARISON:  09/29/2013 FINDINGS: The heart size and mediastinal contours are within normal limits. Both lungs are clear. The visualized skeletal structures are unremarkable. IMPRESSION: No active cardiopulmonary disease. Electronically Signed   By: Danae OrleansJohn A Stahl M.D.   On: 02/14/2019 09:48    Assessment and Plan:   1.  Postpartum cardiomyopathy: - Continue Toprol-XL 25 mg, increase to BID - BNP is elevated and LA atrial filling pressures are elevated, needs gentle diuresis - She is no longer breast-feeding, and needs more blood pressure control. - add low-dose Entresto and uptitrate rapidly for BP control - Avoid Procardia and other negative inotropes with decreased EF - Recheck echo in 3 months, hopefully, she will improve  2.  Hypertension: - Prepregnancy blood pressure controlled on Toprol-XL 100 mg daily. - This was changed to Procardia 90 mg mg during pregnancy, but her blood pressure was never really controlled, despite taking is much as 120 mg of  Procardia daily. - At 7/29 office visit, patient blood pressure was actually under good control but she reported multiple high readings and had Toprol-XL 25 mg added back as needed to her meds. - In the ER today, her blood pressure is not controlled, but she has not had her medications today. -Restart Toprol-XL at 25 mg twice daily and add Entresto.  Principal Problem:   Postpartum cardiomyopathy Active Problems:   Essential hypertension     For questions or updates, please contact [facility] Please consult [email or web address] for contact info under Cardiology/STEMI.   Signed, Theodore DemarkRhonda Barrett, PA-C  02/14/2019 3:41 PM  Personally seen and examined. Agree with above.   33 year old with newly diagnosed dilated cardiomyopathy, postpartum 3 months with recent onset shortness of breath over the past few weeks.  She has been battling excessively high blood pressures.  150/120 for instance.  Toprol used to help in the past but now she is on Procardia.  Echocardiogram performed personally reviewed shows EF in the 15 to 20% range.  Personally reviewed.  RV mildly reduced as well.  Currently she is resting fairly comfortably in bed.  No significant increased work of breathing.  Positive S3.  Regular rate and rhythm.  Positive JVD.  Minimal crackles heard at bases. No significant lower extremity edema.   TSH pending.  Creatinine 1.1.  Potassium 3.9.  BNP 826.  High-sensitivity troponin VI.  Hemoglobin 12.4. Pregnancy test negative.  Assessment and plan  Newly diagnosed dilated cardiomyopathy-possible peripartum cardiomyopathy versus hypertensive cardiomyopathy - We will start Entresto 24/26 p.o. twice daily. - Continue with beta-blockade Toprol 25 twice daily. -Gentle IV diuresis with Lasix IV 20 mg twice a day.  Elevated left atrial pressure noted on echo. - Monitor renal function closely. - If able, increase Entresto soon.  Blood pressure quite elevated and should be able to tolerate.  -Closely monitor I's and O's.  Donato Schultz, MD

## 2019-02-14 NOTE — Telephone Encounter (Signed)
Patient has arrived ED today

## 2019-02-15 ENCOUNTER — Other Ambulatory Visit: Payer: Self-pay | Admitting: Physician Assistant

## 2019-02-15 DIAGNOSIS — O903 Peripartum cardiomyopathy: Secondary | ICD-10-CM

## 2019-02-15 DIAGNOSIS — I10 Essential (primary) hypertension: Secondary | ICD-10-CM

## 2019-02-15 DIAGNOSIS — I5021 Acute systolic (congestive) heart failure: Secondary | ICD-10-CM

## 2019-02-15 LAB — COMPREHENSIVE METABOLIC PANEL
ALT: 17 U/L (ref 0–44)
AST: 20 U/L (ref 15–41)
Albumin: 4 g/dL (ref 3.5–5.0)
Alkaline Phosphatase: 70 U/L (ref 38–126)
Anion gap: 11 (ref 5–15)
BUN: 14 mg/dL (ref 6–20)
CO2: 23 mmol/L (ref 22–32)
Calcium: 9.2 mg/dL (ref 8.9–10.3)
Chloride: 104 mmol/L (ref 98–111)
Creatinine, Ser: 0.99 mg/dL (ref 0.44–1.00)
GFR calc Af Amer: >60 mL/min (ref >60)
GFR calc non Af Amer: >60 mL/min (ref >60)
Glucose, Bld: 92 mg/dL (ref 70–99)
Potassium: 3.7 mmol/L (ref 3.5–5.1)
Sodium: 138 mmol/L (ref 135–145)
Total Bilirubin: 1 mg/dL (ref 0.3–1.2)
Total Protein: 7.4 g/dL (ref 6.5–8.1)

## 2019-02-15 LAB — SARS CORONAVIRUS 2 BY RT PCR (HOSPITAL ORDER, PERFORMED IN ~~LOC~~ HOSPITAL LAB): SARS Coronavirus 2: NEGATIVE

## 2019-02-15 LAB — HIV ANTIBODY (ROUTINE TESTING W REFLEX): HIV Screen 4th Generation wRfx: NONREACTIVE

## 2019-02-15 MED ORDER — SACUBITRIL-VALSARTAN 49-51 MG PO TABS: 1.0000 | ORAL_TABLET | Freq: Two times a day (BID) | ORAL | 0 refills | Status: DC

## 2019-02-15 MED ORDER — FUROSEMIDE 20 MG PO TABS: 20.0000 mg | ORAL_TABLET | Freq: Every day | ORAL | 3 refills | Status: DC

## 2019-02-15 MED ORDER — METOPROLOL SUCCINATE ER 25 MG PO TB24: 25.0000 mg | ORAL_TABLET | Freq: Two times a day (BID) | ORAL | 5 refills | Status: DC

## 2019-02-15 MED ORDER — FUROSEMIDE 20 MG PO TABS
20.0000 mg | ORAL_TABLET | Freq: Every day | ORAL | Status: DC
  Administered 2019-02-15: 20 mg via ORAL
  Filled 2019-02-15: qty 1

## 2019-02-15 MED ORDER — SACUBITRIL-VALSARTAN 49-51 MG PO TABS
1.0000 | ORAL_TABLET | Freq: Two times a day (BID) | ORAL | Status: DC
  Administered 2019-02-15: 1 via ORAL
  Filled 2019-02-15 (×2): qty 1

## 2019-02-15 MED ORDER — SACUBITRIL-VALSARTAN 49-51 MG PO TABS: 1.0000 | ORAL_TABLET | Freq: Two times a day (BID) | ORAL | 5 refills | Status: DC

## 2019-02-15 NOTE — Discharge Instructions (Signed)
Cardiomyopathy, Adult  Cardiomyopathy is a long-term (chronic) disease of the heart muscle. The disease makes the heart muscle thick, weak, or stiff. As a result, the heart works harder to pump blood. Over time, cardiomyopathy can lead to an irregular heartbeat (arrhythmia) and heart failure. There are several types of cardiomyopathy. The kind of cardiomyopathy that you have depends on what part of the heart is affected and how it is affected. What are the causes? This condition may be caused by:  A medical condition that damages the heart, such as diabetes, high blood pressure, or heart attack.  Diseases of the immune system, connective tissues, or endocrine system.  Alcohol abuse.  Using illegal drugs.  Taking certain medicines.  Pregnancy.  Too much iron in the body (hemochromatosis).  Cancer treatments.  Protein buildup in the organs (amyloidosis), or inflammation in the organs (sarcoidosis). In some cases, the cause is not known. What increases the risk? You are more likely to develop this condition if:  You have a gene that is passed down (inherited) from a family member.  You are overweight or obese. What are the signs or symptoms? Often, people with this condition have no symptoms. If you do have symptoms, this may include:  Shortness of breath, especially during activity.  Fatigue.  An irregular heartbeat.  Feeling dizzy or light-headed.  Fainting.  Chest pain.  Coughing.  Swelling of the lower legs, feet, abdomen, or neck veins. How is this diagnosed? This condition is diagnosed based on:  Your symptoms and medical history.  A physical exam.  Blood tests and imaging studies, such as X-ray, echocardiogram, or MRI.  Other tests, such as: ? An electrocardiogram (ECG). ? A portable heart monitor that records your heart's electrical activity (event monitor). ? A stress test. ? Cardiac catheterization and coronary angiogram. ? Heart tissue  biopsy. How is this treated? Your treatment depends on the type and severity of your symptoms. If you do not have symptoms, you may not need treatment. Treatment may include:  General healthy lifestyle changes.  Medicines to: ? Treat high blood pressure, abnormal heart rate, or inflammation. ? Clear excess fluids from your body. ? Prevent blood clots. ? Balance minerals (electrolytes) in your body and get rid of extra sodium in your body. ? Strengthen your heartbeat.  Surgery to: ? Repair a heart defect, remove heart tissue, or destroy tissues in the area of abnormal electrical activity (ablation). ? Implant a device to treat serious heart rhythm problems or to restore the heart's ability to pump blood. ? Replace your heart with a healthy heart. This is done if all other treatments have failed. Other treatments may include:  Cardiac resynchronization therapy (CRT). This restores the heart's normal beating. Follow these instructions at home: Lifestyle   Eat a heart-healthy diet that includes plenty of fruits, vegetables, whole grains, and foods that are low in salt (sodium).  Maintain a healthy weight.  Stay physically active. Ask your health care provider what activities are safe for you.  Do not use any products that contain nicotine or tobacco, such as cigarettes, e-cigarettes, and chewing tobacco. If you need help quitting, ask your health care provider.  Try to get at least 7 hours of sleep each night.  Find healthy ways to manage stress. Alcohol use  Do not drink alcohol if: ? Your health care provider tells you not to drink. ? You are pregnant, may be pregnant, or are planning to become pregnant. If you drink alcohol:  Limit how   much you use to: ? 0-1 drink a day for women. ? 0-2 drinks a day for men.  Be aware of how much alcohol is in your drink. In the [place] one drink equals one 12 oz bottle of beer (355 mL), one 5 oz glass of wine (148 mL), or one 1 oz glass  of hard liquor (44 mL). General instructions  Take over-the-counter and prescription medicines only as told by your health care provider. Some medicines can be dangerous for your heart.  If you are prescribed a blood thinner, make sure you understand what to do in a bleeding situation.  Tell all health care providers, including your dentist, that you have cardiomyopathy. Ask your health care provider if you need antibiotics before having dental care or before surgery.  Ask your health care provider if you should wear a medical identification bracelet. This may be important if you have a pacemaker or a defibrillator.  Get all needed vaccines. Get a flu shot every year.  Work closely with your health care provider to manage any chronic conditions.  If you plan to start a family, talk to a genetic counselor to discuss the risk of having a child with cardiomyopathy.  Keep all follow-up visits as told by your health care provider. This is important, even if you do not have any symptoms. Your health care provider may need to make sure your condition is not getting worse. Contact a health care provider if:  Your symptoms get worse.  You have new symptoms. Get help right away if you:  Have severe chest pain.  Have shortness of breath.  Cough up a pink, bubbly substance.  Feel nauseous and you vomit.  Suddenly become light-headed or dizzy.  Feel your heart beating very quickly.  Feel like your heart is skipping beats. These symptoms may represent a serious problem that is an emergency. Do not wait to see if the symptoms will go away. Get medical help right away. Call your local emergency services (911 in the [place] Do not drive yourself to the hospital. Summary  Cardiomyopathy is a long-term (chronic) disease of the heart muscle.  Over time, cardiomyopathy can lead to an irregular heartbeat (arrhythmia) and heart failure.  A number of treatments are available for this condition.  Your treatment will depend on the type and severity of your symptoms.  Follow your health care provider's instructions on diet, medicines, physical activity, and when to seek help. This information is not intended to replace advice given to you by your health care provider. Make sure you discuss any questions you have with your health care provider. Document Released: 09/04/2004 Document Revised: 08/25/2018 Document Reviewed: 08/25/2018 Elsevier Patient Education  2020 [employer].

## 2019-02-15 NOTE — Discharge Summary (Addendum)
Discharge Summary    Patient ID: Meredith Velez MRN: [id number]; DOB: 12-31-1985  Admit date: 02/14/2019 Discharge date: 02/15/2019  Primary Care Provider: Deeann Saint, MD  Primary Cardiologist: Donato Schultz, MD  Primary Electrophysiologist:  None   Discharge Diagnoses    Principal Problem:   Postpartum cardiomyopathy Active Problems:   Essential hypertension   Acute systolic CHF (congestive heart failure) (HCC)   Allergies No Known Allergies  Diagnostic Studies/Procedures    Echo 02/14/2019 IMPRESSIONS  1. The left ventricle has severely reduced systolic function, with an ejection fraction of 20-25%. The cavity size was severely dilated. Left ventricular diastolic Doppler parameters are consistent with pseudonormalization. Elevated left ventricular  end-diastolic pressure.  2. The right ventricle has normal systolic function. The cavity was normal. There is no increase in right ventricular wall thickness.  3. Mild thickening of the mitral valve leaflet. Mild calcification of the mitral valve leaflet.  4. The aortic valve is tricuspid. Mild thickening of the aortic valve. Mild calcification of the aortic valve.  5. The aorta is normal in size and structure.  6. The interatrial septum was not well visualized.  _____________   History of Present Illness     Meredith Velez is a 33 y.o. female with a hx of pre-eclampsia s/p vag delivery 10/20/2018 of 4 lb 12 oz baby at 80 w 4 d, HTN, PUD, anemia, anxiety/depression, migraines, who is being seen today for the evaluation of possible post-partum CM at the request of Dr Criss Alvine. Ms. Aust had labor induced and spontaneous vag delivery of a baby boy at 78 1/[redacted] weeks gestation on 04/16. D/c w/out complications on 04/19.  6/18, had outpt lap BTL and tolerated well. No S&S of volume overload.  07/29 PCP visit for HTN. BP ok in office, keep log and start Toprol XL 25 mg qd if >140/90.   08/10 phone notes regarding chest  pain and SOB, elevated BP. CP relieved by Prilosec, taking additional BB (up to 75 mg Toprol XL) without good control of her BP (153/119)>> she was advised to go to the ER but pt did not go in till 08/11.  In ER, sx concerning for post-partum CM>>Cards asked to see.   Ms. Luckman noted a dramatic change in her condition this past weekend.  She felt that she was doing well after the delivery and somewhat tired because of taking care of an infant, but otherwise doing okay.  On Sunday morning, she woke up with dyspnea on exertion.  She was unable to climb the stairs in the home without stopping.  She would get noticeably short of breath and have to stop and rest while walking around the grocery store.  She was not short of breath at rest, but felt like she could not do anything without getting short of breath.  She was struggling taking care of the kids.  In the emergency room, she feels a little bit better, but is noticeably short of breath moving herself around in the bed.  She has had minimal lower extremity edema since initial recovering from the pregnancy, and describes orthopnea over the last couple of nights, but denies PND.  The chest pain that she had started after the shortness of breath.  Would get worse with exertion, but is also worse with deep inspiration.  She feels like the Prilosec helped considerably.  She has still had it when she has to breathe harder or faster because she is exerting herself.  However, she has chest  wall tenderness.  This past baby was her second pregnancy.  She has 1 child that is 20 years old.  She had some hypertension towards the end of that pregnancy, but did not have preeclampsia.  Prior to becoming pregnant, her blood pressure was well controlled on metoprolol.  The Procardia did not do as good a job, but was okay until the end of the pregnancy when she started getting preeclampsia and her blood pressure stayed high.  She is no longer breast-feeding.   She has not had blood pressure medication today.  She had a headache when she got here, expresses relief that her headache is improved.   Hospital Course     Consultants: N/A   Patient was admitted to [facility] and underwent IV diuresis.  She was seen in the morning of 02/15/2019 at which time she was doing well without significant shortness of breath.  Echocardiogram obtained on 02/14/2019 shows EF of 20 to 25%, elevated LVEDP, no significant valve disease.  She is currently on Toprol-XL, and Entresto.  IV Lasix was switched to 20 mg daily of oral Lasix.  Entresto was uptitrated to 49-51 mg twice daily.  She was reevaluated in the afternoon of 02/15/2019 at which time her blood pressure stable and she is able to ambulate without significant issue.  She is deemed stable for discharge from cardiology perspective.  I gave her a work note to return to work work next Monday.  She works in [employer]. Patient will need a BMET in 1 week to assess the renal function.  On the next follow-up, her heart failure medication will be further uptitrated.  The plan is for 21-month repeat echocardiogram.    Note, patient is aware that Delene Loll is contraindicated during pregnancy, her tubes are tied and therefore low chance of pregnancy.  _____________  Discharge Vitals Blood pressure 124/85, pulse 79, temperature 97.7 F (36.5 C), temperature source Oral, resp. rate 17, height 5\' 2"  (1.575 m), weight 76.9 kg, SpO2 99 %, not currently breastfeeding.  Filed Weights   02/14/19 1724 02/15/19 0645  Weight: 81.6 kg 76.9 kg    Labs & Radiologic Studies    CBC Recent Labs    02/14/19 0914  WBC 6.7  NEUTROABS 3.7  HGB 12.4  HCT 38.6  MCV 89.1  PLT 712   Basic Metabolic Panel Recent Labs    02/14/19 0914 02/15/19 0447  NA 137 138  K 3.9 3.7  CL 104 104  CO2 23 23  GLUCOSE 90 92  BUN 17 14  CREATININE 1.10* 0.99  CALCIUM 9.1 9.2   Liver Function Tests Recent Labs    02/15/19  0447  AST 20  ALT 17  ALKPHOS 70  BILITOT 1.0  PROT 7.4  ALBUMIN 4.0   No results for input(s): LIPASE, AMYLASE in the last 72 hours. Cardiac Enzymes No results for input(s): CKTOTAL, CKMB, CKMBINDEX, TROPONINI in the last 72 hours. BNP Invalid input(s): POCBNP D-Dimer Recent Labs    02/14/19 0914  DDIMER 0.43   Hemoglobin A1C No results for input(s): HGBA1C in the last 72 hours. Fasting Lipid Panel No results for input(s): CHOL, HDL, LDLCALC, TRIG, CHOLHDL, LDLDIRECT in the last 72 hours. Thyroid Function Tests Recent Labs    02/14/19 1729  TSH 1.533   _____________  Dg Chest 2 View  Result Date: 02/14/2019 CLINICAL DATA:  Chest discomfort and shortness of breath with exertion. Four months postpartum. EXAM: CHEST - 2 VIEW COMPARISON:  09/29/2013 FINDINGS: The  heart size and mediastinal contours are within normal limits. Both lungs are clear. The visualized skeletal structures are unremarkable. IMPRESSION: No active cardiopulmonary disease. Electronically Signed   By: Danae OrleansJohn A Stahl M.D.   On: 02/14/2019 09:48   Disposition   Pt is being discharged home today in good condition.  Follow-up Plans & Appointments    Follow-up Information    Leone Brandngold, Laura R, NP Follow up on 03/02/2019.   Specialties: Cardiology, Radiology Why: 10:45AM. Cardiology visit with Dr. Anne FuSkains' nurse practitioner Contact information: [address] [place] [address] [phone]        [facility] [address] [facility] Follow up on 02/22/2019.   Specialty: Cardiology Why: obtain BMET blood work during lab hour from 8AM-4PM. This is not a fasting labwork.  Contact information: [address], [address] [place] [id number] [phone]         Discharge Instructions    Call MD for:  difficulty breathing, headache or visual disturbances   Complete by: As directed    Call MD for:  extreme fatigue   Complete by: As directed    Call MD for:  persistant  dizziness or light-headedness   Complete by: As directed    Call MD for:  persistant nausea and vomiting   Complete by: As directed    Diet - low sodium heart healthy   Complete by: As directed    Increase activity slowly   Complete by: As directed       Discharge Medications   Allergies as of 02/15/2019   No Known Allergies     Medication List    TAKE these medications   acetaminophen 325 MG tablet Commonly known as: Tylenol Take 2 tablets (650 mg total) by mouth every 4 (four) hours as needed (for pain scale < 4).   fluticasone 50 MCG/ACT nasal spray Commonly known as: FLONASE Place 2 sprays into both nostrils as needed for allergies or rhinitis.   furosemide 20 MG tablet Commonly known as: LASIX Take 1 tablet (20 mg total) by mouth daily. Start taking on: February 16, 2019   Lexapro 10 MG tablet Generic drug: escitalopram Take 20 mg by mouth daily.   metoprolol succinate 25 MG 24 hr tablet Commonly known as: TOPROL-XL Take 1 tablet (25 mg total) by mouth 2 (two) times daily. What changed: when to take this   sacubitril-valsartan 49-51 MG Commonly known as: ENTRESTO Take 1 tablet by mouth 2 (two) times daily.        Acute coronary syndrome (MI, NSTEMI, STEMI, etc) this admission?: No.    Outstanding Labs/Studies   BMET  Duration of Discharge Encounter   Greater than 30 minutes including physician time.  Ramond DialSigned, Hao Meng, PA 02/15/2019, 4:43 PM    Personally seen and examined. Agree with above.   SOB much improved, ambulating well. No CP.  She desires to go home to her children.  GEN: Well nourished, well developed, in no acute distress  HEENT: normal  Neck: no JVD, carotid bruits, or masses Cardiac: RRR; no murmurs, rubs, or gallops,no edema  Respiratory:  clear to auscultation bilaterally, normal work of breathing GI: soft, nontender, nondistended, + BS MS: no deformity or atrophy  Skin: warm and dry, no rash Neuro:  Alert and Oriented x 3,  Strength and sensation are intact Psych: euthymic mood, full affect  EF 25%  Creat 0.99, K3.7 TSH nl  A/P   Dilated cardiomyopathy likely peripartum cardiomyopathy/HTN  - Entresto now  49/51 BID  - Continue with Toprol 25 twice daily.   - Discussed the case, curbside with advanced heart failure team  - BMET 1 week.   - ECHO 3 months  - Pregnancy precautions discussed (tubal ligation)  - BP much better.   Will see in close follow up.   Donato SchultzMark Durante Violett, MD

## 2019-02-15 NOTE — Progress Notes (Signed)
   Primary Cardiologist: Candee Furbish, MD  Meredith Velez (DOB 10/22/1985) was admitted at [facility] from 8/11-8/12. She may return to work on 02/20/2019.  Thank you  Almyra Deforest, [place] 02/15/2019, 4:44 PM

## 2019-02-15 NOTE — TOC Benefit Eligibility Note (Signed)
Transition of Care [facility]) Benefit Eligibility Note    Patient Details  Name: Meredith Velez MRN: [id number] Date of Birth: 1985-11-08   Medication/Dose: Delene Loll  49-51 MG BID  Covered?: Yes  Tier: 3 Drug  Prescription Coverage Preferred Pharmacy: [facility]  AND [facility]  Spoke with Person/Company/Phone Number:: ARYLN  @ [place] [employer] [facility]  [phone]  Co-Pay: $ 60.00  Prior Approval: No  Deductible: Unmet       Memory Argue Phone Number: 02/15/2019, 1:03 PM

## 2019-02-15 NOTE — Progress Notes (Addendum)
Progress Note  Patient Name: Meredith Velez Date of Encounter: 02/15/2019  Primary Cardiologist: Candee Furbish, MD   Subjective   Denies any CP or SOB.   Inpatient Medications    Scheduled Meds: . enoxaparin (LOVENOX) injection  40 mg Subcutaneous Q24H  . escitalopram  20 mg Oral Daily  . furosemide  20 mg Intravenous BID  . metoprolol succinate  25 mg Oral BID  . sacubitril-valsartan  1 tablet Oral BID  . sodium chloride flush  3 mL Intravenous Q12H   Continuous Infusions: . sodium chloride     PRN Meds: sodium chloride, acetaminophen, ALPRAZolam, fluticasone, metoprolol tartrate, nitroGLYCERIN, ondansetron (ZOFRAN) IV, sodium chloride flush, zolpidem   Vital Signs    Vitals:   02/14/19 2328 02/15/19 0000 02/15/19 0601 02/15/19 0645  BP: 113/81 118/76 124/90 (!) 123/95  Pulse: 80 82 78 84  Resp:      Temp:    98 F (36.7 C)  TempSrc:    Oral  SpO2: 97% 100% 99% 99%  Weight:    76.9 kg  Height:        Intake/Output Summary (Last 24 hours) at 02/15/2019 0746 Last data filed at 02/15/2019 0554 Gross per 24 hour  Intake 120 ml  Output 700 ml  Net -580 ml   Last 3 Weights 02/15/2019 02/14/2019 02/01/2019  Weight (lbs) 169 lb 8 oz 180 lb 180 lb  Weight (kg) 76.885 kg 81.647 kg 81.647 kg      Telemetry    NSR with PVCs - Personally Reviewed  ECG    NSR with poor R wave progression in anterior leads - Personally Reviewed  Physical Exam   GEN: No acute distress.   Neck: minimal JVD Cardiac: RRR, no murmurs, rubs, or gallops.  Respiratory: Clear to auscultation bilaterally. GI: Soft, nontender, non-distended  MS: No edema; No deformity. Neuro:  Nonfocal  Psych: Normal affect   Labs    High Sensitivity Troponin:   Recent Labs  Lab 02/14/19 0914  TROPONINIHS 6      Cardiac EnzymesNo results for input(s): TROPONINI in the last 168 hours. No results for input(s): TROPIPOC in the last 168 hours.   Chemistry Recent Labs  Lab 02/14/19 0914  02/15/19 0447  NA 137 138  K 3.9 3.7  CL 104 104  CO2 23 23  GLUCOSE 90 92  BUN 17 14  CREATININE 1.10* 0.99  CALCIUM 9.1 9.2  PROT  --  7.4  ALBUMIN  --  4.0  AST  --  20  ALT  --  17  ALKPHOS  --  70  BILITOT  --  1.0  GFRNONAA >60 >60  GFRAA >60 >60  ANIONGAP 10 11     Hematology Recent Labs  Lab 02/14/19 0914  WBC 6.7  RBC 4.33  HGB 12.4  HCT 38.6  MCV 89.1  MCH 28.6  MCHC 32.1  RDW 13.6  PLT 385    BNP Recent Labs  Lab 02/14/19 0915  BNP 826.8*     DDimer  Recent Labs  Lab 02/14/19 0914  DDIMER 0.43     Radiology    Dg Chest 2 View  Result Date: 02/14/2019 CLINICAL DATA:  Chest discomfort and shortness of breath with exertion. Four months postpartum. EXAM: CHEST - 2 VIEW COMPARISON:  09/29/2013 FINDINGS: The heart size and mediastinal contours are within normal limits. Both lungs are clear. The visualized skeletal structures are unremarkable. IMPRESSION: No active cardiopulmonary disease. Electronically Signed   By: Jenny Reichmann  Geanie Cooley M.D.   On: 02/14/2019 09:48    Cardiac Studies   Echo 02/14/2019 IMPRESSIONS    1. The left ventricle has severely reduced systolic function, with an ejection fraction of 20-25%. The cavity size was severely dilated. Left ventricular diastolic Doppler parameters are consistent with pseudonormalization. Elevated left ventricular  end-diastolic pressure.  2. The right ventricle has normal systolic function. The cavity was normal. There is no increase in right ventricular wall thickness.  3. Mild thickening of the mitral valve leaflet. Mild calcification of the mitral valve leaflet.  4. The aortic valve is tricuspid. Mild thickening of the aortic valve. Mild calcification of the aortic valve.  5. The aorta is normal in size and structure.  6. The interatrial septum was not well visualized.   Patient Profile     33 y.o. female with PMH of pre-eclampsia s/p vag delivery on 10/20/2018, HTN, anemia, anxiety and  depression who presented to the ED on 02/14/2019 with CP and SOB. EF by echo was 20-25%.   Assessment & Plan    1. Post-partum cardiomyopathy:   - TSH normal.   - Echo 02/14/2019 EF 20-25%, elevated LVEDP, no significant valve disease.  - continue Toprol XL, Entresto, will uptitrate.  Watch blood pressure.  - consider switch to 40mg  daily PO lasix. 1 week BMET (may need to decrease to 20mg  daily lasix if Cr elevated). Consider D/C today if feeling well this afternoon however she still has some shortness of breath at baseline.  Repeat echo as outpatient in 3 month.    2. HTN: BP controlled. On BB and entresto. SBP in 110s   For questions or updates, please contact [facility] Please consult [email or web address] for contact info under        Signed, Azalee Course, PA  02/15/2019, 7:46 AM    Personally seen and examined. Agree with above.   Still feeling some shortness of breath at baseline, when talking on the phone for instance.  No chest pain, no syncope.  GEN: Well nourished, well developed, in no acute distress  HEENT: normal  Neck: Mild JVD, carotid bruits, or masses Cardiac: RRR; no murmurs, rubs, or gallops,no edema  Respiratory:  clear to auscultation bilaterally, normal work of breathing GI: soft, nontender, nondistended, + BS MS: no deformity or atrophy  Skin: warm and dry, no rash Neuro:  Alert and Oriented x 3, Strength and sensation are intact Psych: euthymic mood, full affect  BNP 800 telemetry and EKG personally reviewed unremarkable. Echo EF 20 to 25%.  Assessment and plan:  Dilated cardiomyopathy likely peripartum cardiomyopathy - Hypertension is under much better control currently.  We will go ahead and increase her Entresto to 49/51 twice daily.  Continue with Toprol 25 twice daily.  XL. - If after lunch today she is ambulating well and feeling comfortable enough, we could consider potential discharge this afternoon however she seems very comfortable with the notion  of staying 1 more night if necessary.  I want her to feel comfortable before going home. - Discussed the case, curbside with advanced heart failure team.  Donato Schultz, MD

## 2019-02-22 ENCOUNTER — Other Ambulatory Visit: Payer: Self-pay

## 2019-02-22 ENCOUNTER — Other Ambulatory Visit: Payer: 59 | Admitting: *Deleted

## 2019-02-22 DIAGNOSIS — O903 Peripartum cardiomyopathy: Secondary | ICD-10-CM

## 2019-02-23 LAB — BASIC METABOLIC PANEL
BUN/Creatinine Ratio: 14 (ref 9–23)
BUN: 18 mg/dL (ref 6–20)
CO2: 21 mmol/L (ref 20–29)
Calcium: 9.9 mg/dL (ref 8.7–10.2)
Chloride: 102 mmol/L (ref 96–106)
Creatinine, Ser: 1.27 mg/dL — ABNORMAL HIGH (ref 0.57–1.00)
GFR calc Af Amer: 64 mL/min/{1.73_m2} (ref >59)
GFR calc non Af Amer: 55 mL/min/{1.73_m2} — ABNORMAL LOW (ref >59)
Glucose: 111 mg/dL — ABNORMAL HIGH (ref 65–99)
Potassium: 4.8 mmol/L (ref 3.5–5.2)
Sodium: 141 mmol/L (ref 134–144)

## 2019-02-24 ENCOUNTER — Other Ambulatory Visit: Payer: Self-pay

## 2019-02-24 DIAGNOSIS — O903 Peripartum cardiomyopathy: Secondary | ICD-10-CM

## 2019-02-24 DIAGNOSIS — Z79899 Other long term (current) drug therapy: Secondary | ICD-10-CM

## 2019-02-24 NOTE — Progress Notes (Signed)
Patient was informed of her lab results and to reduce Lasix to every other day and to have repeat BMET in 1 week. Orders placed.

## 2019-02-25 ENCOUNTER — Other Ambulatory Visit: Payer: Self-pay | Admitting: Cardiology

## 2019-02-25 ENCOUNTER — Telehealth: Payer: Self-pay | Admitting: Cardiology

## 2019-02-25 MED ORDER — SPIRONOLACTONE 25 MG PO TABS: 12.5000 mg | ORAL_TABLET | Freq: Every day | ORAL | 4 refills | Status: DC

## 2019-02-25 NOTE — Telephone Encounter (Signed)
   Paged on call answering service regarding patient having mild elevated BP at 127/109, HR 74 and a several pound increase in weight from baseline of 169lb to 172lb this a.m.  Patient reports that she is menstruating and therefore this could be contributing.  She denies shortness of breath, LE swelling orthopnea symptoms.  She states her baseline BP is typically in the 120s over 80s.  She recently had labs performed which showed an increase in her creatinine.  Given this, her Lasix was decreased to every other day.  She is new to [place] as well.  She is due for repeat lab work in 1 week and follow-up appointment with Cecilie Kicks, NP.  We will add spironolactone 12.5 mg daily to her regimen.  Lab and follow-up appointment compliance was reinforced.  Otherwise she is doing well.  Kathyrn Drown NP-C [place] Pager: [phone]

## 2019-02-28 NOTE — Progress Notes (Signed)
Cardiology Office Note   Date:  03/01/2019   ID:  Meredith Velez, DOB 01/03/1986, MRN [id number]  PCP:  Deeann SaintBanks, Shannon R, MD  Cardiologist: Dr. Anne FuSkains, MD  Chief Complaint  Patient presents with  . Cardiomyopathy    History of Present Illness: Meredith Velez is a 33 y.o. female with a hx of pre-eclampsia and subsequent cardiomyopathy, HTN, PUD, anemia, anxiety/depression and migraines who was seen by cardiology service for post-partum CM recent hospital admission.  Echocardiogram obtained on 02/14/2019 showed EF of 20 to 25%, elevated LVEDP and no significant valve disease. She was placed on Toprol-XL and Entresto.  IV Lasix was transitioned to 20 mg PO daily during her hospital course. Entresto was uptitrated to 49-51 mg twice daily.    Plan at discharge was for BMET and hopeful up-titration of Entresto.  Repeat echo in 793-months for re-evaluation. She was ultimately discharged on 02/15/2019  She then called our outpatient answering service 02/25/2019 regarding elevated BP at 127/109, HR 74 and a several pound increase in weight from baseline of 169lb to172lb. She reported that she was menstruating and therefore this could be contributing. She denied shortness of breath, LE swelling orthopnea symptoms. She stated her baseline BP is typically in the 120s over 80s. She recently had labs performed which showed an increase in her creatinine. Given this, her Lasix was decreased to every other day.  She is new to [place] as well.    Today she states that her weight remains mildly elevated however reports that she is menstruating and likely the cause.  She has many questions regarding her cardiomyopathy including fluid and salt intake, medications and reassessment.  We discussed all of these in depth at her visit.  As above, her Lasix was decreased to every other day due to mild rise in her creatinine.  We will recheck this today and titrate her medications as tolerated.  Since  decreasing her Lasix every other day, she reports mild shortness of breath with exertion.  She does not appear markedly fluid volume overloaded on exam.  She states she does feel better when taking the 20 mg of Lasix every day.  She denies chest pain, palpitations, orthopnea, LE swelling or syncope.  BP is very stable.  She is adamant about checking her BP and weight daily.   Past Medical History:  Diagnosis Date  . Anemia   . Anxiety   . Depression    pp depression  . Frequent headaches   . GERD (gastroesophageal reflux disease)   . Hypertension   . Migraines   . 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 Seasonal allergies   . Ulcer, stomach peptic   . UTI (lower urinary tract infection)   . Wears glasses     Past Surgical History:  Procedure Laterality Date  . COLONOSCOPY    . LAPAROSCOPIC BILATERAL SALPINGECTOMY Bilateral 01/04/2019   Procedure: LAPAROSCOPIC BILATERAL SALPINGECTOMY;  Surgeon: Osborn Cohooberts, Angela, MD;  [facility];  Service: Gynecology;  Laterality: Bilateral;     Current Outpatient Medications  Medication Sig Dispense Refill  . acetaminophen (TYLENOL) 325 MG tablet Take 2 tablets (650 mg total) by mouth every 4 (four) hours as needed (for pain scale < 4). 60 tablet 0  . fluticasone (FLONASE) 50 MCG/ACT nasal spray Place 2 sprays into both nostrils as needed for allergies or rhinitis.     . furosemide (LASIX) 20 MG tablet Take 1 tablet (20 mg total) by mouth daily. 30 tablet  3  . furosemide (LASIX) 20 MG tablet Take 20 mg by mouth every other day.    . metoprolol succinate (TOPROL-XL) 25 MG 24 hr tablet Take 1 tablet (25 mg total) by mouth 2 (two) times daily. 60 tablet 5  . sacubitril-valsartan (ENTRESTO) 49-51 MG Take 1 tablet by mouth 2 (two) times daily. 60 tablet 5  . spironolactone (ALDACTONE) 25 MG tablet Take 0.5 tablets (12.5 mg total) by mouth daily. 60 tablet 4   No current facility-administered medications for this visit.      Allergies:   Patient has no known allergies.    Social History:  The patient  reports that she has never smoked. She has never used smokeless tobacco. She reports current alcohol use. She reports that she does not use drugs.   Family History:  The patient's family history includes Alzheimer's disease in her paternal grandmother; Breast cancer in her maternal aunt; Diabetes in her father and mother; Heart disease in her mother; Hyperlipidemia in her mother; Hypertension in her brother, father, and mother; Kidney disease in her mother; Lung cancer in her maternal uncle; Prostate cancer in her maternal grandfather and maternal grandmother; Stroke in her father.    ROS:  Please see the history of present illness. Otherwise, review of systems are positive for none.   All other systems are reviewed and negative.    PHYSICAL EXAM: VS:  BP 110/74   Pulse 80   Ht 5\' 2"  (1.575 m)   Wt 174 lb 13.6 oz (79.3 kg)   SpO2 98%   BMI 31.98 kg/m  , BMI Body mass index is 31.98 kg/m.   General: Well developed, well nourished, NAD Neck: Negative for carotid bruits. No JVD Lungs:Clear to ausculation bilaterally. No wheezes, rales, or rhonchi. Breathing is unlabored. Cardiovascular: RRR with S1 S2. No murmurs Extremities: No edema. No clubbing or cyanosis. DP/PT pulses 2+ bilaterally Neuro: Alert and oriented. No focal deficits. No facial asymmetry. MAE spontaneously. Psych: Responds to questions appropriately with normal affect.     EKG:  EKG is not ordered today.  Recent Labs: 10/21/2018: Magnesium 6.2 02/14/2019: B Natriuretic Peptide 826.8; Hemoglobin 12.4; Platelets 385; TSH 1.533 02/15/2019: ALT 17 02/22/2019: BUN 18; Creatinine, Ser 1.27; Potassium 4.8; Sodium 141    Lipid Panel    Component Value Date/Time   CHOL 126 05/11/2014 0937   TRIG 29.0 05/11/2014 0937   HDL 43.10 05/11/2014 0937   CHOLHDL 3 05/11/2014 0937   VLDL 5.8 05/11/2014 0937   LDLCALC 77 05/11/2014 0937      Wt  Readings from Last 3 Encounters:  03/01/19 174 lb 13.6 oz (79.3 kg)  02/15/19 169 lb 8 oz (76.9 kg)  02/01/19 180 lb (81.6 kg)    Other studies Reviewed: Additional studies/ records that were reviewed today include:   Echo 02/14/2019 1. The left ventricle has severely reduced systolic function, with an ejection fraction of 20-25%. The cavity size was severely dilated. Left ventricular diastolic Doppler parameters are consistent with pseudonormalization. Elevated left ventricular  end-diastolic pressure. 2. The right ventricle has normal systolic function. The cavity was normal. There is no increase in right ventricular wall thickness. 3. Mild thickening of the mitral valve leaflet. Mild calcification of the mitral valve leaflet. 4. The aortic valve is tricuspid. Mild thickening of the aortic valve. Mild calcification of the aortic valve. 5. The aorta is normal in size and structure. 6. The interatrial septum was not well visualized.   ASSESSMENT AND PLAN:  1.  Postpartum cardiomyopathy: -Echocardiogram 02/14/2019 with LVEF of 20 to 25%, elevated LVEDP and no significant valvular disease -Toprol-XL and Entresto initiated during hospitalization -Repeat lab work 1 week after discharge revealed mild elevation in creatinine.  Given this her Lasix was decreased to every other day. -Spironolactone 12.5 mg initiated after outpatient phone call regarding mild elevation in BP  -Obtain BMET -We will attempt to uptitrate Lasix to 20 mg daily as well as intensified dose of Entresto based on lab work -Plan for repeat echocardiogram 06/2019  2.  Hypertension: -Stable, 110/24 -Continue current regimen as above  3.  Acute kidney injury: -Creatinine, 1.27 at last lab draw 02/22/2019 up from 0.99 on 02/15/2019 -Given this will, retrial labs today and titrate meds based on results -We will attempt to increase Lasix to 20 mg daily -Does not appear ready for Entresto titration to high-dose    Current medicines are reviewed at length with the patient today.  The patient does not have concerns regarding medicines.  The following changes have been made:  no change  Labs/ tests ordered today include: BMET  Orders Placed This Encounter  Procedures  . Basic metabolic panel    Disposition:   FU with me in 1 month  Signed, Kathyrn Drown, NP  03/01/2019 [facility] [place], [place], [place]  [address] Phone: [phone]; Fax: [phone]

## 2019-03-01 ENCOUNTER — Other Ambulatory Visit: Payer: Self-pay

## 2019-03-01 ENCOUNTER — Encounter: Payer: Self-pay | Admitting: Cardiology

## 2019-03-01 ENCOUNTER — Ambulatory Visit: Payer: 59 | Admitting: Cardiology

## 2019-03-01 VITALS — BP 110/74 | HR 80 | Ht 62.0 in | Wt 174.8 lb

## 2019-03-01 DIAGNOSIS — O903 Peripartum cardiomyopathy: Secondary | ICD-10-CM

## 2019-03-01 DIAGNOSIS — I10 Essential (primary) hypertension: Secondary | ICD-10-CM

## 2019-03-01 DIAGNOSIS — Z79899 Other long term (current) drug therapy: Secondary | ICD-10-CM | POA: Diagnosis not present

## 2019-03-01 NOTE — Patient Instructions (Addendum)
Medication Instructions:  Your physician recommends that you continue on your current medications as directed. Please refer to the Current Medication list given to you today.  If you need a refill on your cardiac medications before your next appointment, please call your pharmacy.   Lab work: TODAY:  BMET  If you have labs (blood work) drawn today and your tests are completely normal, you will receive your results only by: Marland Kitchen MyChart Message (if you have MyChart) OR . A paper copy in the mail If you have any lab test that is abnormal or we need to change your treatment, we will call you to review the results.  Testing/Procedures: None ordered  Follow-Up: At [facility], you and your health needs are our priority.  As part of our continuing mission to provide you with exceptional heart care, we have created designated Provider Care Teams.  These Care Teams include your primary Cardiologist (physician) and Advanced Practice Providers (APPs -  Physician Assistants and Nurse Practitioners) who all work together to provide you with the care you need, when you need it. Dennis Bast are scheduled to see Kathyrn Drown, NP, 03/28/2019 at 3:00.   Any Other Special Instructions Will Be Listed Below (If Applicable).

## 2019-03-02 ENCOUNTER — Ambulatory Visit: Payer: 59 | Admitting: Cardiology

## 2019-03-02 ENCOUNTER — Telehealth: Payer: Self-pay

## 2019-03-02 LAB — BASIC METABOLIC PANEL
BUN/Creatinine Ratio: 12 (ref 9–23)
BUN: 12 mg/dL (ref 6–20)
CO2: 22 mmol/L (ref 20–29)
Calcium: 9.2 mg/dL (ref 8.7–10.2)
Chloride: 104 mmol/L (ref 96–106)
Creatinine, Ser: 1.02 mg/dL — ABNORMAL HIGH (ref 0.57–1.00)
GFR calc Af Amer: 83 mL/min/{1.73_m2} (ref >59)
GFR calc non Af Amer: 72 mL/min/{1.73_m2} (ref >59)
Glucose: 84 mg/dL (ref 65–99)
Potassium: 4.1 mmol/L (ref 3.5–5.2)
Sodium: 141 mmol/L (ref 134–144)

## 2019-03-02 NOTE — Telephone Encounter (Signed)
The patient has been notified of the result and verbalized understanding.  All questions (if any) were answered. Wilma Flavin, RN 03/02/2019 5:00 PM

## 2019-03-02 NOTE — Telephone Encounter (Signed)
Attempted to call pt regarding lab work-lmtcb

## 2019-03-08 ENCOUNTER — Telehealth: Payer: Self-pay | Admitting: Cardiology

## 2019-03-08 NOTE — Telephone Encounter (Signed)
Pt c/o BP issue: STAT if pt c/o blurred vision, one-sided weakness or slurred speech  1. What are your last 5 BP readings?   08/23 :123/90, 134/98 (both done on the same day)  09/01 6:45 pm: 137/105 HR 81  09/02 10:30 am: 171/126 HR 81  2. Are you having any other symptoms (ex. Dizziness, headache, blurred vision, passed out)? Mild fatigue. A little SOB but nothing extreme like when this happened before  3. What is your BP issue? Pt says her BP has spiked. She was told by Kathyrn Drown to contact the office when this happens again  Patient said she takes 25 mg 2x daily of metoprolol

## 2019-03-08 NOTE — Telephone Encounter (Signed)
I spoke with the patient regarding her BP readings. Prior to today her SBP looks good/ DBP up a little bit. She confirms that her reading from 10:30 am this morning is prior to her taking her meds. She states she still has not taken these. She has been running around trying to get her children situated and dealing with work. She states she has just felt tired this morning.   I have confirmed with her that she is taking: - Spironolactone 25 mg- 1/2 tablet (12.5 mg) once daily - Furosemide 20 mg- 1 tablet every other day - Metoprolol succ 25 mg- 1 tablet twice daily  - Entresto 49/51 mg- 1 tablet twice daily  She states the times of her meds have varied a little over the last few days, but she would normally be taking these at [place].  I have advised her to go ahead and take all of her regular AM meds now, wait 1-2 hours and check her BP again and call us with the results.   The patient voices understanding and is agreeable.

## 2019-03-09 ENCOUNTER — Other Ambulatory Visit: Payer: Self-pay

## 2019-03-09 MED ORDER — METOPROLOL SUCCINATE ER 25 MG PO TB24: 50.0000 mg | ORAL_TABLET | Freq: Two times a day (BID) | ORAL | 3 refills | Status: DC

## 2019-03-09 NOTE — Telephone Encounter (Signed)
° ° °  Pt c/o BP issue: STAT if pt c/o blurred vision, one-sided weakness or slurred speech  1. What are your last 5 BP readings? 134/105  2. Are you having any other symptoms (ex. Dizziness, headache, blurred vision, passed out)? tired 3. What is your BP issue? High BP

## 2019-03-09 NOTE — Telephone Encounter (Signed)
Lpm with [name]'s recommendation.  Increase Toprol to 50 mg bid, record BP and call next Tuesday with results.

## 2019-03-09 NOTE — Telephone Encounter (Signed)
    She can increase her Toprol to 50mg  twice daily and call us back with BP results on Tuesday (office closed Monday)  Thank you  Sharee Pimple

## 2019-03-09 NOTE — Telephone Encounter (Signed)
Informed patient of [name]'s recommendation.  She verbalized understanding.

## 2019-03-09 NOTE — Telephone Encounter (Signed)
Lpm 9/3

## 2019-03-09 NOTE — Telephone Encounter (Signed)
Follow up   Patient states that she is returning call. Please call.

## 2019-03-24 NOTE — Progress Notes (Signed)
Cardiology Office Note   Date:  03/28/2019   ID:  Meredith Velez, DOB 1985/08/24, MRN [id number]  PCP:  Deeann SaintBanks, Shannon R, MD  Cardiologist:  Dr. Anne FuSkains, MD   Chief Complaint  Patient presents with  . Follow-up  . Hypertension     History of Present Illness: Meredith SitChandrea Taylor Nuncio is a 33 y.o. female with a hx of pre-eclampsia and subsequent cardiomyopathy, HTN, PUD, anemia, anxiety/depression and migraines who was seen by cardiology service for post-partum CM recent hospital admission.  Echocardiogram obtained on 02/14/2019 showed EF of 20 to 25%, elevated LVEDP and no significant valve disease. She was placed on Toprol-XL andEntresto. IV Lasix was transitioned to 20 mg PO daily during her hospital course. Entresto was uptitrated to 49-51 mg twice daily.   Plan at discharge was for BMET and hopeful up-titration of Entresto. Repeat echo in 633-months for re-evaluation.She was ultimately discharged on 02/15/2019  She then called our outpatient answering service 02/25/2019 regarding elevated BP at 127/109, HR 74 and a several pound increase in weight from baseline of 169lbto172lb. She reported that she was menstruating and therefore this could be contributing. She denied shortness of breath, LE swelling orthopnea symptoms. She stated her baseline BP is typically in the 120s over 80s.   She was then seen in follow-up 03/01/2019.  At that visit, her Lasix was decreased to every other day due to mild rise in her creatinine.  Repeat lab work showed stabilization of creatinine.  She states she does feel better when taking the 20 mg of Lasix every day.   Patient is seen today for cardiomyopathy and BP follow-up.  She reports her BPs have been more stable since she combined her Toprol that was previously taken 50 mg twice daily to Toprol 100 mg once daily.  Her BP today is 128/84.  She is very persistent about keeping an accurate log.  She does have complaints of fatigue however we  discussed that she is a new mom, is working and is trying to balance a lot of things at the current moment.  Plan is to reassess her LV function early December with close follow-up with Dr. Anne FuSkains thereafter.  If she continues to be fairly fatigued, we discussed possibly transitioning off Toprol and uptitrating her Sherryll Burgerntresto with the possible addition of further antihypertensives if needed.  She denies shortness of breath, LE swelling, orthopnea, dizziness, palpitations or syncope.   Past Medical History:  Diagnosis Date  . Anemia   . Anxiety   . Depression    pp depression  . Frequent headaches   . GERD (gastroesophageal reflux disease)   . Hypertension   . Migraines   . 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 Seasonal allergies   . Ulcer, stomach peptic   . UTI (lower urinary tract infection)   . Wears glasses     Past Surgical History:  Procedure Laterality Date  . COLONOSCOPY    . LAPAROSCOPIC BILATERAL SALPINGECTOMY Bilateral 01/04/2019   Procedure: LAPAROSCOPIC BILATERAL SALPINGECTOMY;  Surgeon: Osborn Cohooberts, Angela, MD;  [facility];  Service: Gynecology;  Laterality: Bilateral;     Current Outpatient Medications  Medication Sig Dispense Refill  . acetaminophen (TYLENOL) 325 MG tablet Take 2 tablets (650 mg total) by mouth every 4 (four) hours as needed (for pain scale < 4). 60 tablet 0  . fluticasone (FLONASE) 50 MCG/ACT nasal spray Place 2 sprays into both nostrils as needed for allergies or rhinitis.     .Marland Kitchen  furosemide (LASIX) 20 MG tablet Take 1 tablet (20 mg total) by mouth daily. 30 tablet 3  . furosemide (LASIX) 20 MG tablet Take 20 mg by mouth every other day.    . metoprolol succinate (TOPROL-XL) 25 MG 24 hr tablet Take 2 tablets (50 mg total) by mouth 2 (two) times daily. Take with or immediately following a meal. 90 tablet 3  . sacubitril-valsartan (ENTRESTO) 49-51 MG Take 1 tablet by mouth 2 (two) times daily. 60 tablet 5  . spironolactone  (ALDACTONE) 25 MG tablet Take 0.5 tablets (12.5 mg total) by mouth daily. 60 tablet 4   No current facility-administered medications for this visit.     Allergies:   Patient has no known allergies.    Social History:  The patient  reports that she has never smoked. She has never used smokeless tobacco. She reports current alcohol use. She reports that she does not use drugs.   Family History:  The patient's family history includes Alzheimer's disease in her paternal grandmother; Breast cancer in her maternal aunt; Diabetes in her father and mother; Heart disease in her mother; Hyperlipidemia in her mother; Hypertension in her brother, father, and mother; Kidney disease in her mother; Lung cancer in her maternal uncle; Prostate cancer in her maternal grandfather and maternal grandmother; Stroke in her father.    ROS:  Please see the history of present illness. Otherwise, review of systems are positive for none.   All other systems are reviewed and negative.    PHYSICAL EXAM: VS:  BP 128/84   Pulse 80   Ht 5\' 2"  (1.575 m)   Wt 172 lb 12.8 oz (78.4 kg)   SpO2 99%   BMI 31.61 kg/m  , BMI Body mass index is 31.61 kg/m.   General: Well developed, well nourished, NAD Lungs:Clear to ausculation bilaterally. No wheezes, rales, or rhonchi. Breathing is unlabored. Cardiovascular: RRR with S1 S2. No murmurs, rubs, gallops, or LV heave appreciated. Extremities: No edema. No clubbing or cyanosis. DP pulses 2+ bilaterally Neuro: Alert and oriented. No focal deficits. No facial asymmetry. MAE spontaneously. Psych: Responds to questions appropriately with normal affect.     EKG:  EKG is not ordered today.  Recent Labs: 10/21/2018: Magnesium 6.2 02/14/2019: B Natriuretic Peptide 826.8; Hemoglobin 12.4; Platelets 385; TSH 1.533 02/15/2019: ALT 17 03/01/2019: BUN 12; Creatinine, Ser 1.02; Potassium 4.1; Sodium 141    Lipid Panel    Component Value Date/Time   CHOL 126 05/11/2014 0937   TRIG  29.0 05/11/2014 0937   HDL 43.10 05/11/2014 0937   CHOLHDL 3 05/11/2014 0937   VLDL 5.8 05/11/2014 0937   LDLCALC 77 05/11/2014 0937     Wt Readings from Last 3 Encounters:  03/28/19 172 lb 12.8 oz (78.4 kg)  03/01/19 174 lb 13.6 oz (79.3 kg)  02/15/19 169 lb 8 oz (76.9 kg)     Other studies Reviewed: Additional studies/ records that were reviewed today include:   Echo 02/14/2019 1. The left ventricle has severely reduced systolic function, with an ejection fraction of 20-25%. The cavity size was severely dilated. Left ventricular diastolic Doppler parameters are consistent with pseudonormalization. Elevated left ventricular  end-diastolic pressure. 2. The right ventricle has normal systolic function. The cavity was normal. There is no increase in right ventricular wall thickness. 3. Mild thickening of the mitral valve leaflet. Mild calcification of the mitral valve leaflet. 4. The aortic valve is tricuspid. Mild thickening of the aortic valve. Mild calcification of the aortic valve. 5.  The aorta is normal in size and structure. 6. The interatrial septum was not well visualized.  ASSESSMENT AND PLAN:  1.  Postpartum cardiomyopathy: -Echocardiogram 02/14/2019 with LVEF of 20 to 25%, elevated LVEDP and no significant valvular disease -Toprol-XL and Entresto initiated during hospitalization -Continue Lasix 20 mg, Toprol 100, Entresto 49-51 and spironolactone 12.5 -Plan for repeat echocardiogram 06/2019 with close follow-up with Dr. Marlou Porch thereafter -No HF symptoms at this time  2.  Hypertension: -Stable,  128/84 -Patient previously was discharged on Toprol 50 mg twice daily>> currently taking Toprol 100 mg once daily with better BP results -Continue Entresto 49-51>> could possibly attempt to uptitrate to high dose Entresto -Continue current regimen as above  3.  Acute kidney injury>>> stabilized: -Most recent creatinine on 03/01/2019 with creatinine at 1.02  -Resolved   Current medicines are reviewed at length with the patient today.  The patient does not have concerns regarding medicines.  The following changes have been made: Toprol 100 mg daily  Labs/ tests ordered today include: None  No orders of the defined types were placed in this encounter.  Disposition:   FU with Dr. Marlou Porch in 3 months  Signed, Kathyrn Drown, NP  03/28/2019 3:50 PM    [address], [place], [place]  [address] Phone: [phone]; Fax: [phone]

## 2019-03-28 ENCOUNTER — Other Ambulatory Visit: Payer: Self-pay

## 2019-03-28 ENCOUNTER — Ambulatory Visit: Payer: 59 | Admitting: Cardiology

## 2019-03-28 ENCOUNTER — Encounter (INDEPENDENT_AMBULATORY_CARE_PROVIDER_SITE_OTHER): Payer: Self-pay

## 2019-03-28 ENCOUNTER — Encounter: Payer: Self-pay | Admitting: Cardiology

## 2019-03-28 VITALS — BP 128/84 | HR 80 | Ht 62.0 in | Wt 172.8 lb

## 2019-03-28 DIAGNOSIS — I425 Other restrictive cardiomyopathy: Secondary | ICD-10-CM

## 2019-03-28 DIAGNOSIS — I10 Essential (primary) hypertension: Secondary | ICD-10-CM | POA: Diagnosis not present

## 2019-03-28 DIAGNOSIS — O903 Peripartum cardiomyopathy: Secondary | ICD-10-CM | POA: Diagnosis not present

## 2019-03-28 MED ORDER — METOPROLOL SUCCINATE ER 100 MG PO TB24: 100.0000 mg | ORAL_TABLET | Freq: Every day | ORAL | 3 refills | Status: DC

## 2019-03-28 NOTE — Patient Instructions (Signed)
Medication Instructions:  Take 100 mg of METOPROLOL ER by mouth once daily. If you need a refill on your cardiac medications before your next appointment, please call your pharmacy.   Lab work: None ordered today  If you have labs (blood work) drawn today and your tests are completely normal, you will receive your results only by: Marland Kitchen MyChart Message (if you have MyChart) OR . A paper copy in the mail If you have any lab test that is abnormal or we need to change your treatment, we will call you to review the results.  Testing/Procedures: Your physician has requested that you have an echocardiogram. Echocardiography is a painless test that uses sound waves to create images of your heart. It provides your doctor with information about the size and shape of your heart and how well your heart's chambers and valves are working. This procedure takes approximately one hour. There are no restrictions for this procedure.    Follow-Up: At [facility], you and your health needs are our priority.  As part of our continuing mission to provide you with exceptional heart care, we have created designated Provider Care Teams.  These Care Teams include your primary Cardiologist (physician) and Advanced Practice Providers (APPs -  Physician Assistants and Nurse Practitioners) who all work together to provide you with the care you need, when you need it. You will need a follow up appointment in 3 months.  Please call our office 2 months in advance to schedule this appointment.  You may see Candee Furbish, MD or one of the following Advanced Practice Providers on your designated Care Team:   Truitt Merle, NP Cecilie Kicks, NP . Kathyrn Drown, NP  Any Other Special Instructions Will Be Listed Below (If Applicable).

## 2019-04-07 ENCOUNTER — Other Ambulatory Visit: Payer: Self-pay

## 2019-04-07 ENCOUNTER — Encounter (INDEPENDENT_AMBULATORY_CARE_PROVIDER_SITE_OTHER): Payer: Self-pay

## 2019-04-07 ENCOUNTER — Ambulatory Visit (HOSPITAL_COMMUNITY): Payer: 59 | Attending: Internal Medicine

## 2019-04-07 DIAGNOSIS — O903 Peripartum cardiomyopathy: Secondary | ICD-10-CM | POA: Insufficient documentation

## 2019-04-12 ENCOUNTER — Telehealth: Payer: Self-pay

## 2019-04-12 NOTE — Telephone Encounter (Signed)
Notes recorded by Frederik Schmidt, RN on 04/12/2019 at 11:02 AM EDT  [id number] 10/7  ------

## 2019-04-12 NOTE — Telephone Encounter (Signed)
Notes recorded by Frederik Schmidt, RN on 04/12/2019 at 12:19 PM EDT  The patient has been notified of the Echo result and verbalized understanding. All questions (if any) were answered.  Frederik Schmidt, RN 04/12/2019 12:19 PM

## 2019-04-12 NOTE — Telephone Encounter (Signed)
-----   Message from [name] D McDaniel, NP sent at 04/12/2019 10:51 AM EDT ----- Good news!  Please let the patient know that her EF has improved from 02/2019 echocardiogram.  EF is now 40 to 45% and previously was 20-25%.  We will plan to keep on with the same medications and follow with a repeat echocardiogram in several more months in hopes for more improvement. 

## 2019-04-12 NOTE — Telephone Encounter (Signed)
-----   Message from Tommie Raymond, NP sent at 04/12/2019 10:51 AM EDT ----- Kermit Balo news!  Please let the patient know that her EF has improved from 02/2019 echocardiogram.  EF is now 40 to 45% and previously was 20-25%.  We will plan to keep on with the same medications and follow with a repeat echocardiogram in several more months in hopes for more improvement.

## 2019-06-10 ENCOUNTER — Emergency Department (HOSPITAL_COMMUNITY): Payer: 59

## 2019-06-10 ENCOUNTER — Other Ambulatory Visit: Payer: Self-pay

## 2019-06-10 ENCOUNTER — Emergency Department (HOSPITAL_COMMUNITY)
Admission: EM | Admit: 2019-06-10 | Discharge: 2019-06-10 | Disposition: A | Discharge status: Home / Self Care | Payer: 59 | Attending: Emergency Medicine | Admitting: Emergency Medicine

## 2019-06-10 ENCOUNTER — Encounter (HOSPITAL_COMMUNITY): Payer: Self-pay | Admitting: Emergency Medicine

## 2019-06-10 DIAGNOSIS — R4182 Altered mental status, unspecified: Secondary | ICD-10-CM | POA: Diagnosis not present

## 2019-06-10 DIAGNOSIS — J069 Acute upper respiratory infection, unspecified: Secondary | ICD-10-CM

## 2019-06-10 DIAGNOSIS — H53149 Visual discomfort, unspecified: Secondary | ICD-10-CM | POA: Diagnosis not present

## 2019-06-10 DIAGNOSIS — R05 Cough: Secondary | ICD-10-CM | POA: Insufficient documentation

## 2019-06-10 DIAGNOSIS — R110 Nausea: Secondary | ICD-10-CM | POA: Insufficient documentation

## 2019-06-10 DIAGNOSIS — R0981 Nasal congestion: Secondary | ICD-10-CM | POA: Diagnosis not present

## 2019-06-10 DIAGNOSIS — Z79899 Other long term (current) drug therapy: Secondary | ICD-10-CM | POA: Insufficient documentation

## 2019-06-10 DIAGNOSIS — Z20828 Contact with and (suspected) exposure to other viral communicable diseases: Secondary | ICD-10-CM | POA: Diagnosis not present

## 2019-06-10 DIAGNOSIS — J019 Acute sinusitis, unspecified: Secondary | ICD-10-CM

## 2019-06-10 DIAGNOSIS — R0789 Other chest pain: Secondary | ICD-10-CM | POA: Diagnosis not present

## 2019-06-10 DIAGNOSIS — Z20822 Contact with and (suspected) exposure to covid-19: Secondary | ICD-10-CM

## 2019-06-10 DIAGNOSIS — H6123 Impacted cerumen, bilateral: Secondary | ICD-10-CM | POA: Diagnosis not present

## 2019-06-10 DIAGNOSIS — H6122 Impacted cerumen, left ear: Secondary | ICD-10-CM

## 2019-06-10 DIAGNOSIS — R519 Headache, unspecified: Secondary | ICD-10-CM | POA: Diagnosis present

## 2019-06-10 LAB — COMPREHENSIVE METABOLIC PANEL
ALT: 16 U/L (ref 0–44)
AST: 17 U/L (ref 15–41)
Albumin: 4.3 g/dL (ref 3.5–5.0)
Alkaline Phosphatase: 76 U/L (ref 38–126)
Anion gap: 8 (ref 5–15)
BUN: 8 mg/dL (ref 6–20)
CO2: 22 mmol/L (ref 22–32)
Calcium: 9.2 mg/dL (ref 8.9–10.3)
Chloride: 107 mmol/L (ref 98–111)
Creatinine, Ser: 0.86 mg/dL (ref 0.44–1.00)
GFR calc Af Amer: >60 mL/min (ref >60)
GFR calc non Af Amer: >60 mL/min (ref >60)
Glucose, Bld: 89 mg/dL (ref 70–99)
Potassium: 4.1 mmol/L (ref 3.5–5.1)
Sodium: 137 mmol/L (ref 135–145)
Total Bilirubin: 1.3 mg/dL — ABNORMAL HIGH (ref 0.3–1.2)
Total Protein: 8 g/dL (ref 6.5–8.1)

## 2019-06-10 LAB — CBC WITH DIFFERENTIAL/PLATELET
Abs Immature Granulocytes: 0.03 10*3/uL (ref 0.00–0.07)
Basophils Absolute: 0 10*3/uL (ref 0.0–0.1)
Basophils Relative: 0 %
Eosinophils Absolute: 0.1 10*3/uL (ref 0.0–0.5)
Eosinophils Relative: 1 %
HCT: 41.9 % (ref 36.0–46.0)
Hemoglobin: 13.5 g/dL (ref 12.0–15.0)
Immature Granulocytes: 0 %
Lymphocytes Relative: 14 %
Lymphs Abs: 1.5 10*3/uL (ref 0.7–4.0)
MCH: 30.4 pg (ref 26.0–34.0)
MCHC: 32.2 g/dL (ref 30.0–36.0)
MCV: 94.4 fL (ref 80.0–100.0)
Monocytes Absolute: 0.6 10*3/uL (ref 0.1–1.0)
Monocytes Relative: 6 %
Neutro Abs: 8.4 10*3/uL — ABNORMAL HIGH (ref 1.7–7.7)
Neutrophils Relative %: 79 %
Platelets: 327 10*3/uL (ref 150–400)
RBC: 4.44 MIL/uL (ref 3.87–5.11)
RDW: 13 % (ref 11.5–15.5)
WBC: 10.7 10*3/uL — ABNORMAL HIGH (ref 4.0–10.5)
nRBC: 0 % (ref 0.0–0.2)

## 2019-06-10 LAB — I-STAT BETA HCG BLOOD, ED (MC, WL, AP ONLY): I-stat hCG, quantitative: <5 m[IU]/mL (ref <5)

## 2019-06-10 LAB — SARS CORONAVIRUS 2 (TAT 6-24 HRS): SARS Coronavirus 2: NEGATIVE

## 2019-06-10 MED ORDER — CETIRIZINE-PSEUDOEPHEDRINE ER 5-120 MG PO TB12: 1.0000 | ORAL_TABLET | Freq: Every day | ORAL | 0 refills | Status: DC

## 2019-06-10 MED ORDER — NETI POT SINUS WASH 2300-700 MG NA KIT: PACK | NASAL | 0 refills | Status: DC

## 2019-06-10 MED ORDER — LABETALOL HCL 5 MG/ML IV SOLN: 10.0000 mg | Freq: Once | INTRAVENOUS | Status: DC

## 2019-06-10 MED ORDER — FLUTICASONE PROPIONATE 50 MCG/ACT NA SUSP: 2.0000 | Freq: Every day | NASAL | 0 refills | Status: DC

## 2019-06-10 MED ORDER — MORPHINE SULFATE (PF) 4 MG/ML IV SOLN
4.0000 mg | Freq: Once | INTRAVENOUS | Status: AC
  Administered 2019-06-10: 4 mg via INTRAVENOUS
  Filled 2019-06-10: qty 1

## 2019-06-10 MED ORDER — SODIUM CHLORIDE 0.9 % IV BOLUS
1000.0000 mL | Freq: Once | INTRAVENOUS | Status: AC
  Administered 2019-06-10: 1000 mL via INTRAVENOUS

## 2019-06-10 NOTE — ED Triage Notes (Signed)
Pt reports headache, cough, nasal congestion and feeling hot and cold since yesterday, no known sick contacts.

## 2019-06-10 NOTE — Discharge Instructions (Addendum)
Please use Tylenol or ibuprofen for pain.  You may use 600 mg ibuprofen every 6 hours or 1000 mg of Tylenol every 6 hours.  You may choose to alternate between the 2.  This would be most effective.  Not to exceed 4 g of Tylenol within 24 hours.  Not to exceed 3200 mg ibuprofen 24 hours.  Use your home flonase.   Viral Illness TREATMENT  Treatment is directed at relieving symptoms. There is no cure. Antibiotics are not effective, because the infection is caused by a virus, not by bacteria. Treatment may include:  Increased fluid intake. Sports drinks offer valuable electrolytes, sugars, and fluids.  Breathing heated mist or steam (vaporizer or shower).  Eating chicken soup or other clear broths, and maintaining good nutrition.  Getting plenty of rest.  Using gargles or lozenges for comfort.  Increasing usage of your inhaler if you have asthma.  Return to work when your temperature has returned to normal.  Gargle warm salt water and spit it out for sore throat. Take benadryl to decrease sinus secretions. Continue to alternate between Tylenol and ibuprofen for pain and fever control.  Follow Up: Follow up with your primary care doctor in 5-7 days for recheck of ongoing symptoms.  Return to emergency department for emergent changing or worsening of symptoms.      Person Under Monitoring Name: Meredith Velez  Location: [address] [place] [address]   Infection Prevention Recommendations for Individuals Confirmed to have, or Being Evaluated for, 2019 Novel Coronavirus (COVID-19) Infection Who Receive Care at Home  Individuals who are confirmed to have, or are being evaluated for, COVID-19 should follow the prevention steps below until a healthcare provider or local or state health department says they can return to normal activities.  Stay home except to get medical care You should restrict activities outside your home, except for getting medical care. Do not go to  work, school, or public areas, and do not use public transportation or taxis.  Call ahead before visiting your doctor Before your medical appointment, call the healthcare provider and tell them that you have, or are being evaluated for, COVID-19 infection. This will help the healthcare providers office take steps to keep other people from getting infected. Ask your healthcare provider to call the local or state health department.  Monitor your symptoms Seek prompt medical attention if your illness is worsening (e.g., difficulty breathing). Before going to your medical appointment, call the healthcare provider and tell them that you have, or are being evaluated for, COVID-19 infection. Ask your healthcare provider to call the local or state health department.  Wear a facemask You should wear a facemask that covers your nose and mouth when you are in the same room with other people and when you visit a healthcare provider. People who live with or visit you should also wear a facemask while they are in the same room with you.  Separate yourself from other people in your home As much as possible, you should stay in a different room from other people in your home. Also, you should use a separate bathroom, if available.  Avoid sharing household items You should not share dishes, drinking glasses, cups, eating utensils, towels, bedding, or other items with other people in your home. After using these items, you should wash them thoroughly with soap and water.  Cover your coughs and sneezes Cover your mouth and nose with a tissue when you cough or sneeze, or you  can cough or sneeze into your sleeve. Throw used tissues in a lined trash can, and immediately wash your hands with soap and water for at least 20 seconds or use an alcohol-based hand rub.  Wash your [employer] your hands often and thoroughly with soap and water for at least 20 seconds. You can use an alcohol-based hand sanitizer if  soap and water are not available and if your hands are not visibly dirty. Avoid touching your eyes, nose, and mouth with unwashed hands.   Prevention Steps for Caregivers and Household Members of Individuals Confirmed to have, or Being Evaluated for, COVID-19 Infection Being Cared for in the Home  If you live with, or provide care at home for, a person confirmed to have, or being evaluated for, COVID-19 infection please follow these guidelines to prevent infection:  Follow healthcare providers instructions Make sure that you understand and can help the patient follow any healthcare provider instructions for all care.  Provide for the patients basic needs You should help the patient with basic needs in the home and provide support for getting groceries, prescriptions, and other personal needs.  Monitor the patients symptoms If they are getting sicker, call his or her medical provider and tell them that the patient has, or is being evaluated for, COVID-19 infection. This will help the healthcare providers office take steps to keep other people from getting infected. Ask the healthcare provider to call the local or state health department.  Limit the number of people who have contact with the patient If possible, have only one caregiver for the patient. Other household members should stay in another home or place of residence. If this is not possible, they should stay in another room, or be separated from the patient as much as possible. Use a separate bathroom, if available. Restrict visitors who do not have an essential need to be in the home.  Keep older adults, very young children, and other sick people away from the patient Keep older adults, very young children, and those who have compromised immune systems or chronic health conditions away from the patient. This includes people with chronic heart, lung, or kidney conditions, diabetes, and cancer.  Ensure good ventilation Make  sure that shared spaces in the home have good air flow, such as from an air conditioner or an opened window, weather permitting.  Wash your hands often Wash your hands often and thoroughly with soap and water for at least 20 seconds. You can use an alcohol based hand sanitizer if soap and water are not available and if your hands are not visibly dirty. Avoid touching your eyes, nose, and mouth with unwashed hands. Use disposable paper towels to dry your hands. If not available, use dedicated cloth towels and replace them when they become wet.  Wear a facemask and gloves Wear a disposable facemask at all times in the room and gloves when you touch or have contact with the patients blood, body fluids, and/or secretions or excretions, such as sweat, saliva, sputum, nasal mucus, vomit, urine, or feces.  Ensure the mask fits over your nose and mouth tightly, and do not touch it during use. Throw out disposable facemasks and gloves after using them. Do not reuse. Wash your hands immediately after removing your facemask and gloves. If your personal clothing becomes contaminated, carefully remove clothing and launder. Wash your hands after handling contaminated clothing. Place all used disposable facemasks, gloves, and other waste in a lined container before disposing them with other  household waste. Remove gloves and wash your hands immediately after handling these items.  Do not share dishes, glasses, or other household items with the patient Avoid sharing household items. You should not share dishes, drinking glasses, cups, eating utensils, towels, bedding, or other items with a patient who is confirmed to have, or being evaluated for, COVID-19 infection. After the person uses these items, you should wash them thoroughly with soap and water.  Wash laundry thoroughly Immediately remove and wash clothes or bedding that have blood, body fluids, and/or secretions or excretions, such as sweat, saliva,  sputum, nasal mucus, vomit, urine, or feces, on them. Wear gloves when handling laundry from the patient. Read and follow directions on labels of laundry or clothing items and detergent. In general, wash and dry with the warmest temperatures recommended on the label.  Clean all areas the individual has used often Clean all touchable surfaces, such as counters, tabletops, doorknobs, bathroom fixtures, toilets, phones, keyboards, tablets, and bedside tables, every day. Also, clean any surfaces that may have blood, body fluids, and/or secretions or excretions on them. Wear gloves when cleaning surfaces the patient has come in contact with. Use a diluted bleach solution (e.g., dilute bleach with 1 part bleach and 10 parts water) or a household disinfectant with a label that says EPA-registered for coronaviruses. To make a bleach solution at home, add 1 tablespoon of bleach to 1 quart (4 cups) of water. For a larger supply, add  cup of bleach to 1 gallon (16 cups) of water. Read labels of cleaning products and follow recommendations provided on product labels. Labels contain instructions for safe and effective use of the cleaning product including precautions you should take when applying the product, such as wearing gloves or eye protection and making sure you have good ventilation during use of the product. Remove gloves and wash hands immediately after cleaning.  Monitor yourself for signs and symptoms of illness Caregivers and household members are considered close contacts, should monitor their health, and will be asked to limit movement outside of the home to the extent possible. Follow the monitoring steps for close contacts listed on the symptom monitoring form.   ? If you have additional questions, contact your local health department or call the epidemiologist on call at [phone] (available 24/7). ? This guidance is subject to change. For the most up-to-date guidance from [facility], please  refer to their website: [email or web address]

## 2019-06-10 NOTE — ED Provider Notes (Signed)
Care assumed from [place], [place].  See note for full HPI  In summation 33 year old female presented for evaluation of left-sided headache.  Initially had upper respiratory symptoms include cough, congestion, chills as well as some sinus pressure and rhinorrhea.  Denies sudden onset thunderclap headache.  She has had some NBNB emesis.  She had taken ibuprofen yesterday however none today.  She has been using some Nettie pot saline rinses.  She is also had some pain to her left ear.  She did have a known Covid exposure.  Her initial evaluation by previous provider patient did have abnormal neurologic exam however this was thought to be due to patient lack of initiation.  He did get a CT of her head which was negative.  Labs without any significant findings.  Patient was reassessed multiple times and had a nonfocal neurologic exam without deficits.  Her symptoms resolved with 1 dose of morphine in the emergency department.  She is ambulatory without ataxic gait.  She did have elevated blood pressure in the emergency department however is not taking her home medicines over the last 2 days due to not feeling well.  Previous provider thought patient's elevated blood pressure is due to her headache.  Her blood pressure did come down some with pain medicine.  She did not want any medications for her blood pressure in the emergency department.  Patient states she will take this when she gets home.  There was low suspicion for hypertensive urgency or emergency.  Plan to evaluate patient's ears after cerumen disimpaction.  Patient to be discharged home with Covid testing and symptomatic management.   Physical Exam  BP (!) 168/109 (BP Location: Right Arm)   Pulse 90   Temp 97.7 F (36.5 C)   Resp 18   SpO2 100%   Physical Exam Physical Exam  Constitutional: Pt is oriented to person, place, and time. Pt appears well-developed and well-nourished. No distress.  HENT:  Head: Normocephalic and atraumatic.   Mouth/Throat: Oropharynx is clear and moist.  Ears: TMs normal.  No tenderness with retraction of pinna or palpation of tragus.  TMs without bulging, scarring, erythema or drainage. Eyes: Conjunctivae and EOM are normal. Pupils are equal, round, and reactive to light. No scleral icterus.  No horizontal, vertical or rotational nystagmus  Neck: Normal range of motion. Neck supple.  Full active and passive ROM without pain No midline or paraspinal tenderness No nuchal rigidity or meningeal signs  Mastoid tenderness to palpation. Cardiovascular: Normal rate, regular rhythm and intact distal pulses.   Pulmonary/Chest: Effort normal and breath sounds normal. No respiratory distress. Pt has no wheezes. No rales.  Abdominal: Soft. Bowel sounds are normal. There is no tenderness. There is no rebound and no guarding.  Musculoskeletal: Normal range of motion.  Lymphadenopathy:    No cervical adenopathy.  Neurological: Pt. is alert and oriented to person, place, and time. He has normal reflexes. No cranial nerve deficit.  Exhibits normal muscle tone. Coordination normal.  Mental Status:  Alert, oriented, thought content appropriate. Speech fluent without evidence of aphasia. Able to follow 2 step commands without difficulty.  Cranial Nerves:  II:  Peripheral visual fields grossly normal, pupils equal, round, reactive to light III,IV, VI: ptosis not present, extra-ocular motions intact bilaterally  V,VII: smile symmetric, facial light touch sensation equal VIII: hearing grossly normal bilaterally  IX,X: midline uvula rise  XI: bilateral shoulder shrug equal and strong XII: midline tongue extension  Motor:  5/5 in upper and lower extremities  bilaterally including strong and equal grip strength and dorsiflexion/plantar flexion Sensory: Pinprick and light touch normal in all extremities.  Deep Tendon Reflexes: 2+ and symmetric  Cerebellar: normal finger-to-nose with bilateral upper extremities Gait:  normal gait and balance CV: distal pulses palpable throughout   Skin: Skin is warm and dry. No rash noted. Pt is not diaphoretic.  Psychiatric: Pt has a normal mood and affect. Behavior is normal. Judgment and thought content normal.  Nursing note and vitals reviewed. ED Course/Procedures     Procedures Labs Reviewed  COMPREHENSIVE METABOLIC PANEL - Abnormal; Notable for the following components:      Result Value   Total Bilirubin 1.3 (*)    All other components within normal limits  CBC WITH DIFFERENTIAL/PLATELET - Abnormal; Notable for the following components:   WBC 10.7 (*)    Neutro Abs 8.4 (*)    All other components within normal limits  SARS CORONAVIRUS 2 (TAT 6-24 HRS)  I-STAT BETA HCG BLOOD, ED ([facility], [facility], [facility] ONLY)  Ct Head Wo Contrast  Result Date: 06/10/2019 CLINICAL DATA:  Severe headaches, cough and nasal congestion. EXAM: CT HEAD WITHOUT CONTRAST TECHNIQUE: Contiguous axial images were obtained from the base of the skull through the vertex without intravenous contrast. COMPARISON:  10/21/2018 FINDINGS: Brain: No evidence of acute infarction, hemorrhage, hydrocephalus, extra-axial collection or mass lesion/mass effect. Vascular: No hyperdense vessel or unexpected calcification. Skull: Normal. Negative for fracture or focal lesion. Sinuses/Orbits: Partial opacification of bilateral ethmoid air cells noted. There is mild mucosal thickening involving the right maxillary sinus. The mastoid air cells are clear. Other: None IMPRESSION: 1. Normal brain. 2. Sinus inflammation. Electronically Signed   By: Signa Kell M.D.   On: 06/10/2019 16:34   MDM  Plan to reevaluate ear after irrigation. Plan to dc Home with symptomatic treatment for viral sinusitis if ear exam negative.  Cerumen disimpacted.  Patient with relief with disimpaction.  No tenderness over mastoid.  No evidence of otitis.  Patient continues to be neurovascularly intact.  Patient states symptoms resolved.  Likely viral  sinusitis.  Will hold on antibiotics given symptoms just began and she did not have any purulent rhinorrhea.  She is also tested for Covid.  Discussed home symptomatic management.  Pt HA treated and improved while in ED.  Presentation non concerning for [facility], ICH, Meningitis, mastoiditis, acute angle glaucoma, dural venous sinus thrombosis, hypertensive urgency or emergency or temporal arteritis. Pt is afebrile with no focal neuro deficits, nuchal rigidity, or change in vision. Pt is to follow up with PCP to discuss prophylactic medication. Pt verbalizes understanding and is agreeable with plan to dc.    The patient has been appropriately medically screened and/or stabilized in the ED. I have low suspicion for any other emergent medical condition which would require further screening, evaluation or treatment in the ED or require inpatient management.  Patient is hemodynamically stable and in no acute distress.  Patient able to ambulate in department prior to ED.  Evaluation does not show acute pathology that would require ongoing or additional emergent interventions while in the emergency department or further inpatient treatment.  I have discussed the diagnosis with the patient and answered all questions.  Pain is been managed while in the emergency department and patient has no further complaints prior to discharge.  Patient is comfortable with plan discussed in room and is stable for discharge at this time.  I have discussed strict return precautions for returning to the emergency department.  Patient  was encouraged to follow-up with PCP/specialist refer to at discharge.  Dhrithi Riche was evaluated in Emergency Department on 06/10/2019 for the symptoms described in the history of present illness. She was evaluated in the context of the global COVID-19 pandemic, which necessitated consideration that the patient might be at risk for infection with the SARS-CoV-2 virus that causes COVID-19.  Institutional protocols and algorithms that pertain to the evaluation of patients at risk for COVID-19 are in a state of rapid change based on information released by regulatory bodies including the CDC and federal and state organizations. These policies and algorithms were followed during the patient's care in the ED.      Humzah Harty A, PA-C 06/10/19 2216    Maudie Flakes, MD 06/11/19 2007

## 2019-06-10 NOTE — ED Provider Notes (Addendum)
[facility] EMERGENCY DEPARTMENT Provider Note   CSN: [id number] Arrival date & time: 06/10/19  1426     History   Chief Complaint Chief Complaint  Patient presents with  . Headache  . Nasal Congestion  . Cough    HPI Meredith Velez is a 33 y.o. female      HPI  Patient presents today for severe left-sided headache states. States sx began with cough, cold congestion, subjective fevers and chills yesterday morning when she woke up.  States that headache has intensified and is now severe, 10/10 and constant.  Endorses nausea.  States that before yesterday morning she was feeling her usual state of health.  States that she took some ibuprofen yesterday but none today.  States that she used Nettie pot saline rinse sinuses.  States that she is not but she had discomfort in her left ear which is persisted.  Patient endorses Covid exposure at work.  Denies any loss of taste or smell.   Patient denies any chest pain.  Endorses shortness of breath however this only occurs when she is coughing.    Past Medical History:  Diagnosis Date  . Anemia   . Anxiety   . Depression    pp depression  . Frequent headaches   . GERD (gastroesophageal reflux disease)   . Hypertension   . Migraines   . 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 Seasonal allergies   . Ulcer, stomach peptic   . UTI (lower urinary tract infection)   . Wears glasses     Patient Active Problem List   Diagnosis Date Noted  . 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 Acute systolic CHF (congestive heart failure) (HCC) 02/14/2019  . Postpartum anemia 10/23/2018  . Normal postpartum course 10/23/2018  . Preterm delivery 10/23/2018  . IUGR (intrauterine growth restriction) affecting care of mother, third trimester, not applicable or unspecified fetus 10/20/2018  . Chronic hypertension affecting pregnancy 09/28/2018  . Short cervical length during pregnancy in third trimester 09/28/2018  .  Postpartum depression 09/28/2018  . Anemia of pregnancy 08/06/2018  . Dichorionic diamniotic twin pregnancy in third trimester 05/28/2018  . Pregnant 05/09/2018  . History of peptic ulcer 05/06/2018  . Migraine headache without aura 02/28/2016  . Allergic rhinitis 02/28/2016  . GERD (gastroesophageal reflux disease) 07/12/2014  . Essential hypertension 02/21/2014    Past Surgical History:  Procedure Laterality Date  . COLONOSCOPY    . LAPAROSCOPIC BILATERAL SALPINGECTOMY Bilateral 01/04/2019   Procedure: LAPAROSCOPIC BILATERAL SALPINGECTOMY;  Surgeon: Osborn Coho, MD;  Location: [facility];  Service: Gynecology;  Laterality: Bilateral;     OB History    Gravida  2   Para  2   Term  1   Preterm  1   AB  0   Living  2     SAB  0   TAB  0   Ectopic  0   Multiple  0   Live Births  2            Home Medications    Prior to Admission medications   Medication Sig Start Date End Date Taking? Authorizing Provider  acetaminophen (TYLENOL) 325 MG tablet Take 2 tablets (650 mg total) by mouth every 4 (four) hours as needed (for pain scale < 4). 10/23/18   [place], Lesly Rubenstein, FNP  fluticasone [facility]) 50 MCG/ACT nasal spray Place 2 sprays into both nostrils as needed for allergies or rhinitis.     [provider]  furosemide (LASIX) 20 MG tablet Take 1 tablet (20 mg total) by mouth daily. 02/16/19   Almyra Deforest, PA  furosemide (LASIX) 20 MG tablet Take 20 mg by mouth every other day.    [provider]  metoprolol succinate (TOPROL-XL) 100 MG 24 hr tablet Take 1 tablet (100 mg total) by mouth daily. Take with or immediately following a meal. 03/28/19 06/26/19  Kathyrn Drown D, NP  sacubitril-valsartan (ENTRESTO) 49-51 MG Take 1 tablet by mouth 2 (two) times daily. 02/15/19   Almyra Deforest, PA  spironolactone (ALDACTONE) 25 MG tablet Take 0.5 tablets (12.5 mg total) by mouth daily. 02/25/19 02/25/20  Tommie Raymond, NP    Family History Family  History  Problem Relation Age of Onset  . Hypertension Mother   . Hyperlipidemia Mother   . Heart disease Mother   . Kidney disease Mother   . Diabetes Mother   . Hypertension Father   . Stroke Father   . Diabetes Father   . Hypertension Brother   . Breast cancer Maternal Aunt   . Lung cancer Maternal Uncle   . Prostate cancer Maternal Grandmother   . Prostate cancer Maternal Grandfather   . Alzheimer's disease Paternal Grandmother     Social History Social History   Tobacco Use  . Smoking status: Never Smoker  . Smokeless tobacco: Never Used  Substance Use Topics  . Alcohol use: Yes    Comment: occ  . Drug use: No     Allergies   Patient has no known allergies.   Review of Systems Review of Systems  Constitutional: Positive for chills and fever.  HENT: Positive for congestion and ear pain (left).   Eyes: Positive for photophobia.  Respiratory: Positive for cough, chest tightness and shortness of breath.   Cardiovascular: Negative for chest pain.  Gastrointestinal: Positive for nausea. Negative for abdominal pain, diarrhea and vomiting.  Musculoskeletal: Positive for myalgias. Negative for gait problem.  Neurological: Positive for headaches.     Physical Exam Updated Vital Signs BP (!) 168/109 (BP Location: Right Arm)   Pulse 90   Temp 97.7 F (36.5 C)   Resp 18   SpO2 100%   Physical Exam Vitals signs and nursing note reviewed.  Constitutional:      General: She is not in acute distress. HENT:     Head: Normocephalic and atraumatic.     Right Ear: External ear normal. There is impacted cerumen.     Left Ear: External ear normal. There is impacted cerumen.     Nose: Nose normal. No congestion.     Comments: Moderate nonpurulent congestion bilateral nares left greater than right    Mouth/Throat:     Pharynx: No oropharyngeal exudate or posterior oropharyngeal erythema.     Comments: Uvula midline, no posterior pharynx erythema.  No tonsillar  exudate.  No tonsillar swelling. Eyes:     General: No scleral icterus.       Right eye: No discharge.        Left eye: No discharge.     Conjunctiva/sclera: Conjunctivae normal.  Cardiovascular:     Rate and Rhythm: Normal rate and regular rhythm.  Pulmonary:     Effort: Pulmonary effort is normal.     Breath sounds: Normal breath sounds.  Abdominal:     Palpations: Abdomen is soft.     Tenderness: There is no abdominal tenderness.  Lymphadenopathy:     Cervical: No cervical adenopathy.  Skin:    General: Skin is  warm and dry.  Neurological:     Mental Status: She is alert. Mental status is at baseline.     Comments: Patient oriented X3  Appears confused however it is difficult to redirect.  Significant difficulty following commands.  Patient without slurring or aphasia  Strength 5/5 in upper/lower extremities  Sensation intact in upper/lower extremities   CN I not tested  CN II grossly intact visual fields bilaterally. Did not visualize posterior eye.   CN III, IV, VI PERRLA and EOMs intact bilaterally  CN V Intact sensation to sharp and light touch to the face  CN VII able to assess facial movements due to poor effort CN VIII not tested  CN IX, X no uvula deviation, symmetric rise of soft palate  CN XI 5/5 SCM and trapezius strength bilaterally  CN XII Midline tongue protrusion, symmetric L/R movements   Psychiatric:        Mood and Affect: Mood normal.        Behavior: Behavior normal.      ED Treatments / Results  Labs (all labs ordered are listed, but only abnormal results are displayed) Labs Reviewed  SARS CORONAVIRUS 2 (TAT 6-24 HRS)  COMPREHENSIVE METABOLIC PANEL  CBC WITH DIFFERENTIAL/PLATELET  I-STAT BETA HCG BLOOD, ED ([facility], [facility], [facility] ONLY)    EKG None  Radiology Ct Head Wo Contrast  Result Date: 06/10/2019 CLINICAL DATA:  Severe headaches, cough and nasal congestion. EXAM: CT HEAD WITHOUT CONTRAST TECHNIQUE: Contiguous axial images were obtained  from the base of the skull through the vertex without intravenous contrast. COMPARISON:  10/21/2018 FINDINGS: Brain: No evidence of acute infarction, hemorrhage, hydrocephalus, extra-axial collection or mass lesion/mass effect. Vascular: No hyperdense vessel or unexpected calcification. Skull: Normal. Negative for fracture or focal lesion. Sinuses/Orbits: Partial opacification of bilateral ethmoid air cells noted. There is mild mucosal thickening involving the right maxillary sinus. The mastoid air cells are clear. Other: None IMPRESSION: 1. Normal brain. 2. Sinus inflammation. Electronically Signed   By: Signa Kell M.D.   On: 06/10/2019 16:34    Procedures Procedures (including critical care time)  Medications Ordered in ED Medications  sodium chloride 0.9 % bolus 1,000 mL (1,000 mLs Intravenous New Bag/Given 06/10/19 1559)  morphine 4 MG/ML injection 4 mg (4 mg Intravenous Given 06/10/19 1600)     Initial Impression / Assessment and Plan / ED Course  I have reviewed the triage vital signs and the nursing notes.  Pertinent labs & imaging results that were available during my care of the patient were reviewed by me and considered in my medical decision making (see chart for details).        33 year old female presented to ED for severe headache.  On initial exam patient has difficulty engaging exam is arousable and answer questions but has difficulty following commands making assessment difficult.  Because of severity of symptoms and the patient's mild AMS conducted CT noncontrast head and ordered blood work and Covid test.  4 mg of morphine given with 1 L normal saline and Zofran as patient states she has had decreased intake.   After CT scan reevaluated patient she is much improved after morphine and is able to follow commands now.  Neuro exam is completely benign.  After thorough history patient history and physical exam is consistent with viral sinusitis/viral URI.  We will treat  patient with conservative management.  Blood work is pending as she was difficult for her blood work on.  CT scan shows sinus inflammation.  Recommended Zyrtec or Benadryl along with continued Nettie pot use.  Recommended Tylenol ibuprofen for discomfort.  Doubt SAH, dural venous thrombosis or other encephalopathy. Suspect AMS was due to patient poorly complying with exam as other healthcare team members noted fully operational mentation after my departure from the room. Doubt PRES although patient has elevated BP. She has hx of HTN and did not take medications today.   This patient appears reasonably screened and I doubt any other medical condition requiring further workup, evaluation, or treatment in the ED at this time prior to discharge.   Patient's vitals are WNL apart from vital sign abnormalities discussed above, patient is in NAD, and able to ambulate in the ED at their baseline. Pain has been managed or a plan has been made for home management and has no complaints prior to discharge. Patient is comfortable with above plan and is stable for discharge at this time. All questions were answered prior to disposition. Results from the ER workup discussed with the patient face to face and all questions answered to the best of my ability. The patient is safe for discharge with strict return precautions. Patient appears safe for discharge with appropriate follow-up. Conveyed my impression with the patient and they voiced understanding and are agreeable to plan.   An After Visit Summary was printed and given to the patient.  Portions of this note were generated with Scientist, clinical (histocompatibility and immunogenetics)Dragon dictation software. Dictation errors may occur despite best attempts at proofreading.     Acquanetta SitChandrea Taylor Monarch was evaluated in Emergency Department on 06/10/2019 for the symptoms described in the history of present illness. She was evaluated in the context of the global COVID-19 pandemic, which necessitated consideration that the  patient might be at risk for infection with the SARS-CoV-2 virus that causes COVID-19. Institutional protocols and algorithms that pertain to the evaluation of patients at risk for COVID-19 are in a state of rapid change based on information released by regulatory bodies including the CDC and federal and state organizations. These policies and algorithms were followed during the patient's care in the ED.     At 5 PM [facility] and [facility].  Patient is understanding of plan at this time is to discharge as long as blood work is within normal limits.  Covid test is pending she understands it will take 24 hours to return.  PA Henderly will reassess patient's ears before discharge after irrigation.    Final Clinical Impressions(s) / ED Diagnoses   Final diagnoses:  Acute sinusitis, recurrence not specified, unspecified location  Viral URI with cough  Suspected COVID-19 virus infection    ED Discharge Orders    None       Gailen ShelterFondaw, Ethan Kasperski S, [place] 06/10/19 1704    Gailen ShelterFondaw, Kalep Full S, [place] 06/10/19 1708    Gailen ShelterFondaw, Coraima Tibbs S, [place] 06/10/19 1733    Gailen ShelterFondaw, Matix Henshaw S, [place] 06/10/19 1735    Sabas SousBero, Michael M, MD 06/11/19 2018

## 2019-06-13 ENCOUNTER — Telehealth (HOSPITAL_COMMUNITY): Payer: Self-pay

## 2019-06-22 ENCOUNTER — Other Ambulatory Visit: Payer: Self-pay | Admitting: Physician Assistant

## 2019-06-22 NOTE — Telephone Encounter (Signed)
Rx has been sent to the pharmacy electronically. ° °

## 2019-06-27 ENCOUNTER — Encounter: Payer: Self-pay | Admitting: Cardiology

## 2019-06-27 ENCOUNTER — Other Ambulatory Visit: Payer: Self-pay

## 2019-06-27 ENCOUNTER — Ambulatory Visit (INDEPENDENT_AMBULATORY_CARE_PROVIDER_SITE_OTHER): Payer: 59 | Admitting: Cardiology

## 2019-06-27 VITALS — BP 142/90 | HR 86 | Ht 62.0 in | Wt 176.8 lb

## 2019-06-27 DIAGNOSIS — O903 Peripartum cardiomyopathy: Secondary | ICD-10-CM | POA: Diagnosis not present

## 2019-06-27 DIAGNOSIS — I10 Essential (primary) hypertension: Secondary | ICD-10-CM

## 2019-06-27 MED ORDER — ENTRESTO 97-103 MG PO TABS: 1.0000 | ORAL_TABLET | Freq: Two times a day (BID) | ORAL | 11 refills | Status: DC

## 2019-06-27 NOTE — Progress Notes (Signed)
Cardiology Office Note:    Date:  06/27/2019   ID:  Meredith Velez, DOB 16-Mar-1986, MRN [id number]  PCP:  Billie Ruddy, MD  Cardiologist:  Candee Furbish, MD  Electrophysiologist:  None   Referring MD: Billie Ruddy, MD     History of Present Illness:    Meredith Velez is a 33 y.o. female with a hx of preeclampsia peripartum cardiomyopathy hypertension anxiety depression migraines seen by the cardiology service for postpartum cardiomyopathy during hospitalization in 2020.  Echo 02/14/2019 showed EF of 20 to 25%.  Placed on Entresto and Toprol.  Moderate dose Entresto.  Repeat echocardiogram 04/07/2019 showed EF of 40 to 45% improved from prior.  She clearly understands that she cannot get pregnant with Entresto as this can cause birth defects.  She ended up having a tubal ligation after her last pregnancy.  Past Medical History:  Diagnosis Date  . Anemia   . Anxiety   . Depression    pp depression  . Frequent headaches   . GERD (gastroesophageal reflux disease)   . Hypertension   . Migraines   . 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 Seasonal allergies   . Ulcer, stomach peptic   . UTI (lower urinary tract infection)   . Wears glasses     Past Surgical History:  Procedure Laterality Date  . COLONOSCOPY    . LAPAROSCOPIC BILATERAL SALPINGECTOMY Bilateral 01/04/2019   Procedure: LAPAROSCOPIC BILATERAL SALPINGECTOMY;  Surgeon: Everett Graff, MD;  Location: [facility];  Service: Gynecology;  Laterality: Bilateral;    Current Medications: Current Meds  Medication Sig  . acetaminophen (TYLENOL) 325 MG tablet Take 2 tablets (650 mg total) by mouth every 4 (four) hours as needed (for pain scale < 4).  . cetirizine-pseudoephedrine (ZYRTEC-D) 5-120 MG tablet Take 1 tablet by mouth daily.  . fluticasone (FLONASE) 50 MCG/ACT nasal spray Place 2 sprays into both nostrils daily.  . furosemide (LASIX) 20 MG tablet Take 20 mg by mouth every other  day.  . metoprolol succinate (TOPROL-XL) 100 MG 24 hr tablet Take 1 tablet (100 mg total) by mouth daily. Take with or immediately following a meal.  . Sodium Chloride-Sodium Bicarb (NETI POT SINUS WASH) 2300-700 MG KIT Use for nasal congestion  1-2 times daily  . spironolactone (ALDACTONE) 25 MG tablet Take 0.5 tablets (12.5 mg total) by mouth daily.  . [DISCONTINUED] sacubitril-valsartan (ENTRESTO) 49-51 MG Take 1 tablet by mouth 2 (two) times daily.     Allergies:   Patient has no known allergies.   Social History   Socioeconomic History  . Marital status: Married    Spouse name: Not on file  . Number of children: Not on file  . Years of education: Not on file  . Highest education level: Not on file  Occupational History  . Occupation: Building surveyor: [employer]  Tobacco Use  . Smoking status: Never Smoker  . Smokeless tobacco: Never Used  Substance and Sexual Activity  . Alcohol use: Yes    Comment: occ  . Drug use: No  . Sexual activity: Yes    Birth control/protection: None  Other Topics Concern  . Not on file  Social History Narrative  . Not on file   Social Determinants of Health   Financial Resource Strain:   . Difficulty of Paying Living Expenses: Not on file  Food Insecurity:   . Worried About Charity fundraiser in the Last Year: Not on file  .  Ran Out of Food in the Last Year: Not on file  Transportation Needs:   . Lack of Transportation (Medical): Not on file  . Lack of Transportation (Non-Medical): Not on file  Physical Activity:   . Days of Exercise per Week: Not on file  . Minutes of Exercise per Session: Not on file  Stress:   . Feeling of Stress : Not on file  Social Connections:   . Frequency of Communication with Friends and Family: Not on file  . Frequency of Social Gatherings with Friends and Family: Not on file  . Attends Religious Services: Not on file  . Active Member of Clubs or Organizations: Not on file  . Attends  Archivist Meetings: Not on file  . Marital Status: Not on file     Family History: The patient's family history includes Alzheimer's disease in her paternal grandmother; Breast cancer in her maternal aunt; Diabetes in her father and mother; Heart disease in her mother; Hyperlipidemia in her mother; Hypertension in her brother, father, and mother; Kidney disease in her mother; Lung cancer in her maternal uncle; Prostate cancer in her maternal grandfather and maternal grandmother; Stroke in her father.  ROS:   Please see the history of present illness.     All other systems reviewed and are negative.  EKGs/Labs/Other Studies Reviewed:    The following studies were reviewed today: Ultrasounds as above  EKG:  No new  Recent Labs: 10/21/2018: Magnesium 6.2 02/14/2019: B Natriuretic Peptide 826.8; TSH 1.533 06/10/2019: ALT 16; BUN 8; Creatinine, Ser 0.86; Hemoglobin 13.5; Platelets 327; Potassium 4.1; Sodium 137  Recent Lipid Panel    Component Value Date/Time   CHOL 126 05/11/2014 0937   TRIG 29.0 05/11/2014 0937   HDL 43.10 05/11/2014 0937   CHOLHDL 3 05/11/2014 0937   VLDL 5.8 05/11/2014 0937   LDLCALC 77 05/11/2014 0937    Physical Exam:    VS:  BP (!) 142/90   Pulse 86   Ht '5\' 2"'  (1.575 m)   Wt 176 lb 12.8 oz (80.2 kg)   SpO2 99%   BMI 32.34 kg/m     Wt Readings from Last 3 Encounters:  06/27/19 176 lb 12.8 oz (80.2 kg)  03/28/19 172 lb 12.8 oz (78.4 kg)  03/01/19 174 lb 13.6 oz (79.3 kg)     GEN:  Well nourished, well developed in no acute distress HEENT: Normal NECK: No JVD; No carotid bruits LYMPHATICS: No lymphadenopathy CARDIAC: RRR, no murmurs, rubs, gallops RESPIRATORY:  Clear to auscultation without rales, wheezing or rhonchi  ABDOMEN: Soft, non-tender, non-distended MUSCULOSKELETAL:  No edema; No deformity  SKIN: Warm and dry NEUROLOGIC:  Alert and oriented x 3 PSYCHIATRIC:  Normal affect   ASSESSMENT:    1. Peripartum cardiomyopathy     2. Essential hypertension    PLAN:    In order of problems listed above:  Peripartum cardiomyopathy -Original EF 20%, repeat echo 45%.  This is on Entresto spironolactone and Lasix and Toprol.  Still with some fatigue.  Improved blood pressure control.  Once again understands possibility of birth defects with Entresto.  Status post tubal ligation. -We will go ahead and increase her Entresto to goal dose of 97/11m. -Continue with exercise, discussed today.  Fluid 1.5 to 2 L.  Essential hypertension -Likely contributing as well to her cardiomyopathy.  Blood pressure under better control, but we will go ahead and increase her Entresto to goal dose.  EF improved.  Last creatinine and potassium excellent.  We will have her touch base with Kathyrn Drown, NP in 2 months.  Medication Adjustments/Labs and Tests Ordered: Current medicines are reviewed at length with the patient today.  Concerns regarding medicines are outlined above.  No orders of the defined types were placed in this encounter.  Meds ordered this encounter  Medications  . sacubitril-valsartan (ENTRESTO) 97-103 MG    Sig: Take 1 tablet by mouth 2 (two) times daily.    Dispense:  60 tablet    Refill:  11    Patient Instructions  Medication Instructions:  Please increase your Entresto to 97/103 mg twice daily. Continue all other medications as listed.  *If you need a refill on your cardiac medications before your next appointment, please call your pharmacy*  Follow-Up: At [facility], you and your health needs are our priority.  As part of our continuing mission to provide you with exceptional heart care, we have created designated Provider Care Teams.  These Care Teams include your primary Cardiologist (physician) and Advanced Practice Providers (APPs -  Physician Assistants and Nurse Practitioners) who all work together to provide you with the care you need, when you need it.  Your next appointment:   2  month(s)  The format for your next appointment:   In Person  Provider:   Kathyrn Drown, NP   Thank you for choosing [facility]!!        Signed, Candee Furbish, MD  06/27/2019 10:21 AM    [place]

## 2019-06-27 NOTE — Patient Instructions (Signed)
Medication Instructions:  Please increase your Entresto to 97/103 mg twice daily. Continue all other medications as listed.  *If you need a refill on your cardiac medications before your next appointment, please call your pharmacy*  Follow-Up: At [facility], you and your health needs are our priority.  As part of our continuing mission to provide you with exceptional heart care, we have created designated Provider Care Teams.  These Care Teams include your primary Cardiologist (physician) and Advanced Practice Providers (APPs -  Physician Assistants and Nurse Practitioners) who all work together to provide you with the care you need, when you need it.  Your next appointment:   2 month(s)  The format for your next appointment:   In Person  Provider:   Kathyrn Drown, NP   Thank you for choosing [facility]!!

## 2019-08-26 NOTE — Progress Notes (Signed)
Cardiology Office Note   Date:  08/29/2019   ID:  Meredith Velez, DOB 10-Nov-1985, MRN [id number]  PCP:  Billie Ruddy, MD  Cardiologist: Dr. Marlou Porch, MD  Chief Complaint  Patient presents with  . Follow-up    History of Present Illness: Meredith Velez is a 34 y.o. female who presents for 68-monthfollow-up, seen for Dr. SMarlou Porch  Meredith Velez a hx of preeclampsia peripartum cardiomyopathy, hypertension, anxiety, depression, and migraines seen by the cardiology service for postpartum cardiomyopathy during hospitalization in 2020.  Initial echocardiogram 02/14/2019 showed EF of 20 to 25% at which time she was placed on Entresto and Toprol, moderate dose.Repeat echocardiogram 04/07/2019 showed EF of 40 to 45% improved from prior.  Patient understood that pregnancy while on Entresto could cause birth defects therefore she actually ended up having a tubal ligation with her last pregnancy.  She was most recently seen by Dr. SMarlou Porch12/22/2020 in follow-up.  At that time her EDelene Lollwas increased to high-dose of 97/23 mg.  She was continued on spironolactone, Lasix and Toprol.  Was still having some mild fatigue.  BP was better controlled.  Today she presents for follow-up and states that she is doing well from a CV standpoint however has been experiencing some mild depression.  She states that sometimes she will get overwhelmed and depressed that she is only 34years old and has to deal with all of this. We spent some time discussing ways to alleviate some of her burden.  She reports that she was seeking help with a therapist however has been more recently seen them on an as-needed basis.  Dr. SMarlou Porchhad discussed increasing her physical activity for which she initially was doing fairly well with however reports that when she is not in a group setting it is difficult for her to stay motivated.  We discussed possibly starting a membership at a gym or [facility]  to help with motivation.  She reports that she has not taken her medications since Saturday for these reasons.  BP is a little off today given this.  We did have a long discussion about medication compliance and how her heart function has improved in a relatively short amount of time given this.  Patient agrees.  She denies chest pain, shortness of breath, LE edema, PND, orthopnea, dizziness or syncope.    Past Medical History:  Diagnosis Date  . Anemia   . Anxiety   . Depression    pp depression  . Frequent headaches   . GERD (gastroesophageal reflux disease)   . Hypertension   . Migraines   . 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 Seasonal allergies   . Ulcer, stomach peptic   . UTI (lower urinary tract infection)   . Wears glasses     Past Surgical History:  Procedure Laterality Date  . COLONOSCOPY    . LAPAROSCOPIC BILATERAL SALPINGECTOMY Bilateral 01/04/2019   Procedure: LAPAROSCOPIC BILATERAL SALPINGECTOMY;  Surgeon: REverett Graff MD;  [facility]  Service: Gynecology;  Laterality: Bilateral;     Current Outpatient Medications  Medication Sig Dispense Refill  . acetaminophen (TYLENOL) 325 MG tablet Take 2 tablets (650 mg total) by mouth every 4 (four) hours as needed (for pain scale < 4). 60 tablet 0  . cetirizine-pseudoephedrine (ZYRTEC-D) 5-120 MG tablet Take 1 tablet by mouth daily. 30 tablet 0  . fluticasone (FLONASE) 50 MCG/ACT nasal spray Place 2 sprays into both nostrils daily.  11.1 mL 0  . furosemide (LASIX) 20 MG tablet Take 20 mg by mouth every other day.    . metoprolol succinate (TOPROL-XL) 100 MG 24 hr tablet Take 1 tablet (100 mg total) by mouth daily. Take with or immediately following a meal. 90 tablet 3  . sacubitril-valsartan (ENTRESTO) 97-103 MG Take 1 tablet by mouth 2 (two) times daily. 60 tablet 11  . Sodium Chloride-Sodium Bicarb (NETI POT SINUS WASH) 2300-700 MG KIT Use for nasal congestion  1-2 times daily 1 kit 0  .  spironolactone (ALDACTONE) 25 MG tablet Take 0.5 tablets (12.5 mg total) by mouth daily. 60 tablet 4  . valACYclovir (VALTREX) 1000 MG tablet Take 1 tablet by mouth as needed.     No current facility-administered medications for this visit.    Allergies:   Patient has no known allergies.    Social History:  The patient  reports that she has never smoked. She has never used smokeless tobacco. She reports current alcohol use. She reports that she does not use drugs.   Family History:  The patient's family history includes Alzheimer's disease in her paternal grandmother; Breast cancer in her maternal aunt; Diabetes in her father and mother; Heart disease in her mother; Hyperlipidemia in her mother; Hypertension in her brother, father, and mother; Kidney disease in her mother; Lung cancer in her maternal uncle; Prostate cancer in her maternal grandfather and maternal grandmother; Stroke in her father.    ROS:  Please see the history of present illness. Otherwise, review of systems are positive for none.   All other systems are reviewed and negative.    PHYSICAL EXAM: VS:  BP (!) 150/90   Pulse 82   Ht _0  (1.575 m)   Wt 179 lb (81.2 kg)   SpO2 98%   BMI 32.74 kg/m  , BMI Body mass index is 32.74 kg/m.   General: Well developed, well nourished, NAD Neck: Negative for carotid bruits. No JVD Lungs:Clear to ausculation bilaterally. No wheezes, rales, or rhonchi. Breathing is unlabored. Cardiovascular: RRR with S1 S2. No murmurs Extremities: No edema.  Neuro: Alert and oriented. No focal deficits. No facial asymmetry. MAE spontaneously. Psych: Responds to questions appropriately with normal affect.     EKG:  EKG is ordered today. The ekg ordered today demonstrates NSR with HR 82 bpm, PVCs, no significant change from prior tracing.    Recent Labs: 10/21/2018: Magnesium 6.2 02/14/2019: B Natriuretic Peptide 826.8; TSH 1.533 06/10/2019: ALT 16; BUN 8; Creatinine, Ser 0.86; Hemoglobin  13.5; Platelets 327; Potassium 4.1; Sodium 137    Lipid Panel    Component Value Date/Time   CHOL 126 05/11/2014 0937   TRIG 29.0 05/11/2014 0937   HDL 43.10 05/11/2014 0937   CHOLHDL 3 05/11/2014 0937   VLDL 5.8 05/11/2014 0937   LDLCALC 77 05/11/2014 0937     Wt Readings from Last 3 Encounters:  08/29/19 179 lb (81.2 kg)  06/27/19 176 lb 12.8 oz (80.2 kg)  03/28/19 172 lb 12.8 oz (78.4 kg)    Other studies Reviewed: Additional studies/ records that were reviewed today include:   Echocardiogram 02/14/2019:  1. The left ventricle has severely reduced systolic function, with an  ejection fraction of 20-25%. The cavity size was severely dilated. Left  ventricular diastolic Doppler parameters are consistent with  pseudonormalization. Elevated left ventricular  end-diastolic pressure.  2. The right ventricle has normal systolic function. The cavity was  normal. There is no increase in right ventricular wall thickness.  3. Mild thickening of the mitral valve leaflet. Mild calcification of the  mitral valve leaflet.  4. The aortic valve is tricuspid. Mild thickening of the aortic valve.  Mild calcification of the aortic valve.  5. The aorta is normal in size and structure.  6. The interatrial septum was not well visualized.  Echocardiogram 04/07/2019:  Left ventricular ejection fraction, by visual estimation, is 40 to 45%. The left ventricle has moderately decreased function. Mildly increased left ventricular size. There is no left ventricular hypertrophy. 2. Apical window is foreshortened some, making evaluation difficult Compared to echo images from Aug 2020, LVEF appears improved. 3. Global right ventricle has normal systolic function.The right ventricular size is normal. No increase in right ventricular wall thickness.  ASSESSMENT AND PLAN:  1.  Peripartum cardiomyopathy: -Initial LVEF noted to be 20% in 2020 with repeat echocardiogram 04/07/2019 with improvement  at 40 to 45%.  Entresto increased to high-dose at last office visit 06/27/2019.  She was continued on spironolactone, Lasix and Toprol.  -Denies symptoms -Plan to repeat echocardiogram prior to next appointment in 6 months  2. Essential hypertension: -Stable, 150/80>> reports skipping her medications since Saturday due to mild depression -Compliance reinforced -Continue current regimen  3. Depression: -Previously was followed with a therapist however this is on an as-needed basis more recently -Discussed ways to assist including increasing physical activity in a group setting -Offered for her to message me on my chart if needed  Current medicines are reviewed at length with the patient today.  The patient does not have concerns regarding medicines.  The following changes have been made:  no change  Labs/ tests ordered today include: None   Orders Placed This Encounter  Procedures  . EKG 12-Lead  . ECHOCARDIOGRAM COMPLETE    Disposition:   FU with myself or Dr. Marlou Porch in 6 months  Signed, Kathyrn Drown, NP  08/29/2019 10:33 AM    [address], [place], [place]  [id number] Phone: [phone]; Fax: [phone]

## 2019-08-28 ENCOUNTER — Ambulatory Visit: Payer: 59 | Admitting: Cardiology

## 2019-08-29 ENCOUNTER — Ambulatory Visit: Payer: 59 | Admitting: Cardiology

## 2019-08-29 ENCOUNTER — Encounter: Payer: Self-pay | Admitting: Cardiology

## 2019-08-29 ENCOUNTER — Other Ambulatory Visit: Payer: Self-pay

## 2019-08-29 ENCOUNTER — Encounter (INDEPENDENT_AMBULATORY_CARE_PROVIDER_SITE_OTHER): Payer: Self-pay

## 2019-08-29 VITALS — BP 150/90 | HR 82 | Ht 62.0 in | Wt 179.0 lb

## 2019-08-29 DIAGNOSIS — F32A Depression, unspecified: Secondary | ICD-10-CM

## 2019-08-29 DIAGNOSIS — I10 Essential (primary) hypertension: Secondary | ICD-10-CM

## 2019-08-29 DIAGNOSIS — F329 Major depressive disorder, single episode, unspecified: Secondary | ICD-10-CM | POA: Diagnosis not present

## 2019-08-29 DIAGNOSIS — O903 Peripartum cardiomyopathy: Secondary | ICD-10-CM | POA: Diagnosis not present

## 2019-08-29 NOTE — Patient Instructions (Signed)
Medication Instructions:   Your physician recommends that you continue on your current medications as directed. Please refer to the Current Medication list given to you today.  *If you need a refill on your cardiac medications before your next appointment, please call your pharmacy*  Lab Work:  None ordered today  If you have labs (blood work) drawn today and your tests are completely normal, you will receive your results only by: Marland Kitchen MyChart Message (if you have MyChart) OR . A paper copy in the mail If you have any lab test that is abnormal or we need to change your treatment, we will call you to review the results.  Testing/Procedures:  Your physician has requested that you have an echocardiogram. Echocardiography is a painless test that uses sound waves to create images of your heart. It provides your doctor with information about the size and shape of your heart and how well your heart's chambers and valves are working. This procedure takes approximately one hour. There are no restrictions for this procedure.  Follow-Up: At [facility], you and your health needs are our priority.  As part of our continuing mission to provide you with exceptional heart care, we have created designated Provider Care Teams.  These Care Teams include your primary Cardiologist (physician) and Advanced Practice Providers (APPs -  Physician Assistants and Nurse Practitioners) who all work together to provide you with the care you need, when you need it.  Your next appointment:   6 month(s)  The format for your next appointment:   In Person  Provider:   You may see Donato Schultz, MD or one of the following Advanced Practice Providers on your designated Care Team:    Norma Fredrickson, NP  Nada Boozer, NP  Georgie Chard, NP   Other Instructions  Schedule ECHO about 2 weeks before 6 month follow up

## 2019-09-11 IMAGING — DX CHEST - 2 VIEW
2 series · 2 of 2 positions shown · non-contrast
Comparison: 09/29/2013

CLINICAL DATA: Chest discomfort and shortness of breath with
exertion. Four months postpartum.

EXAM:
CHEST - 2 VIEW

[w chest pa]
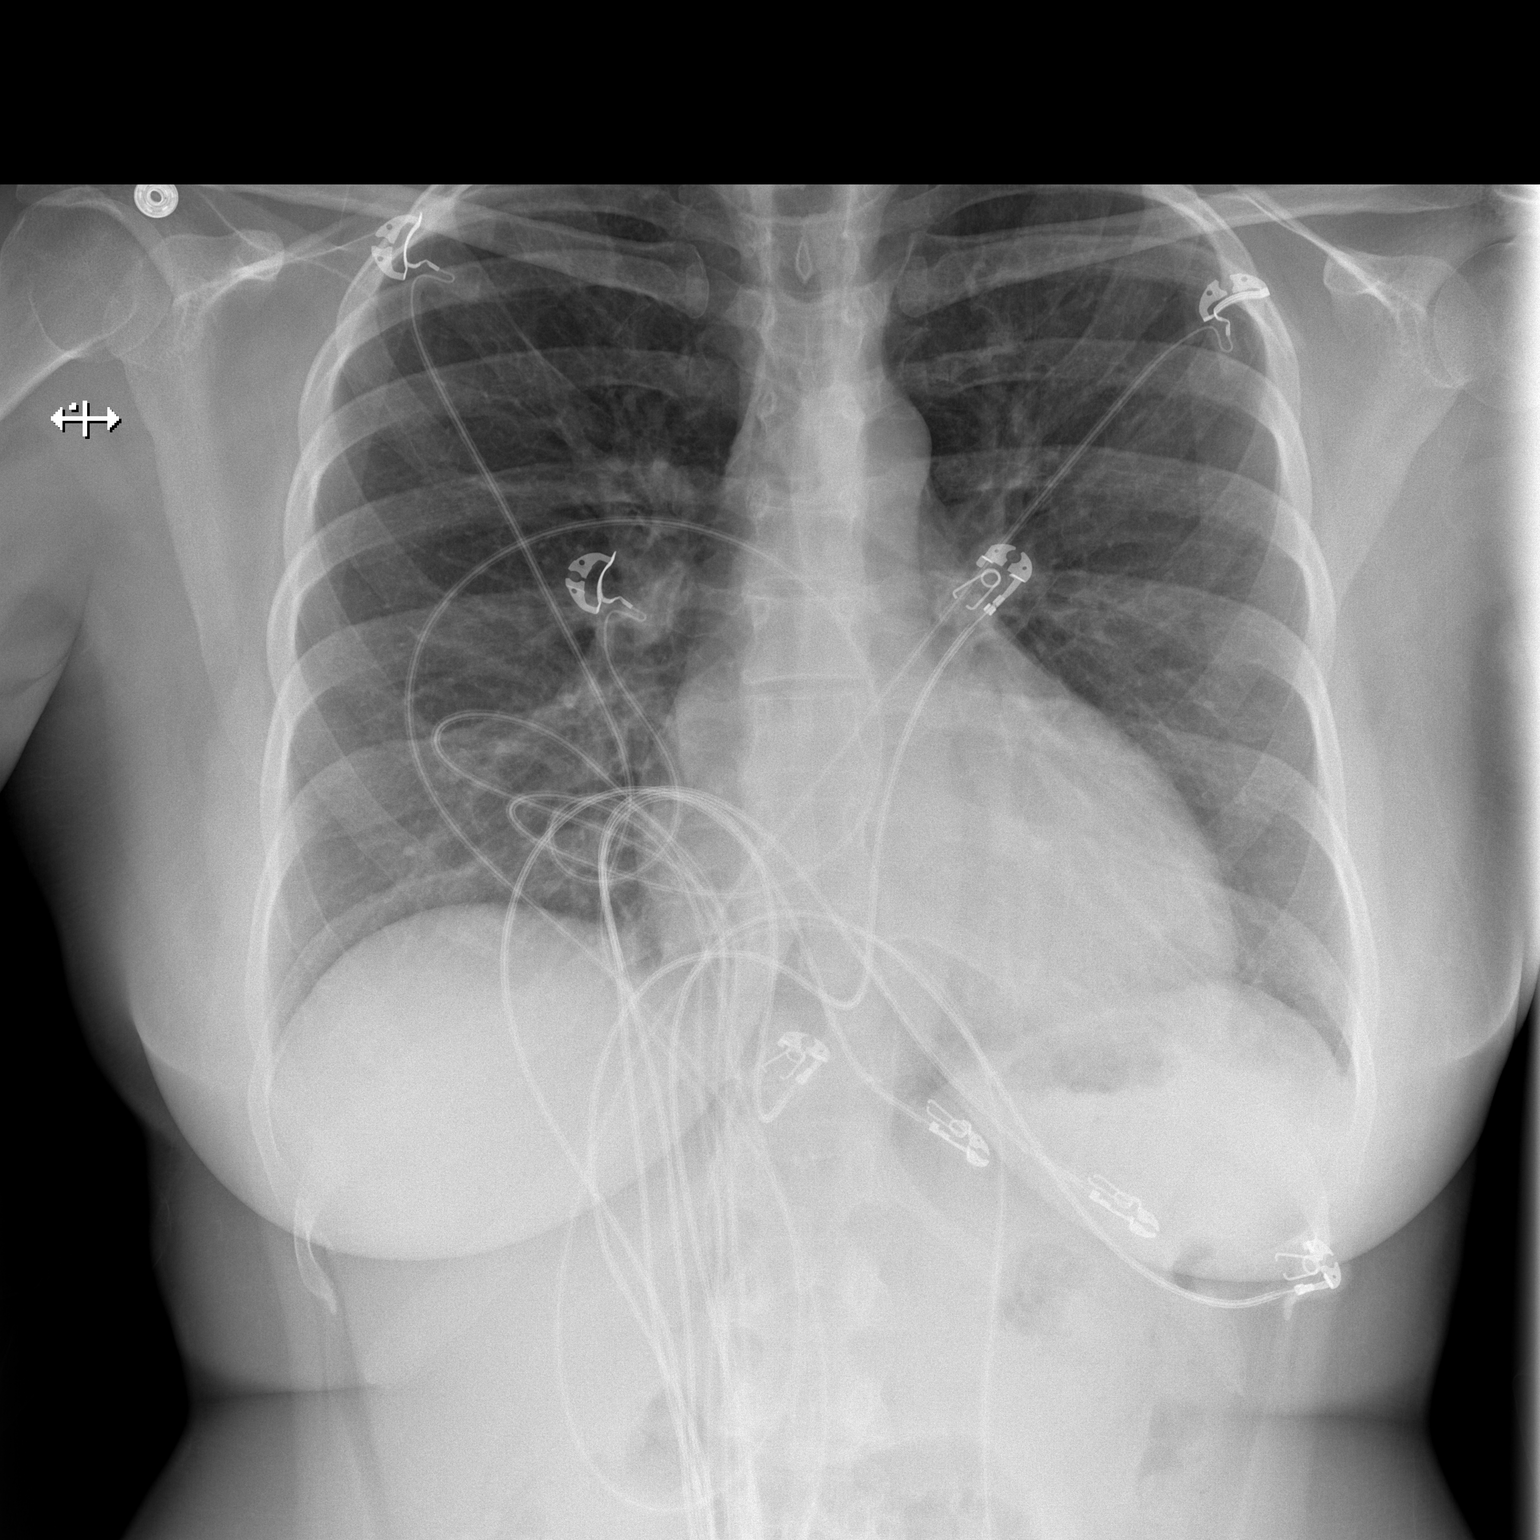

[w chest lat]
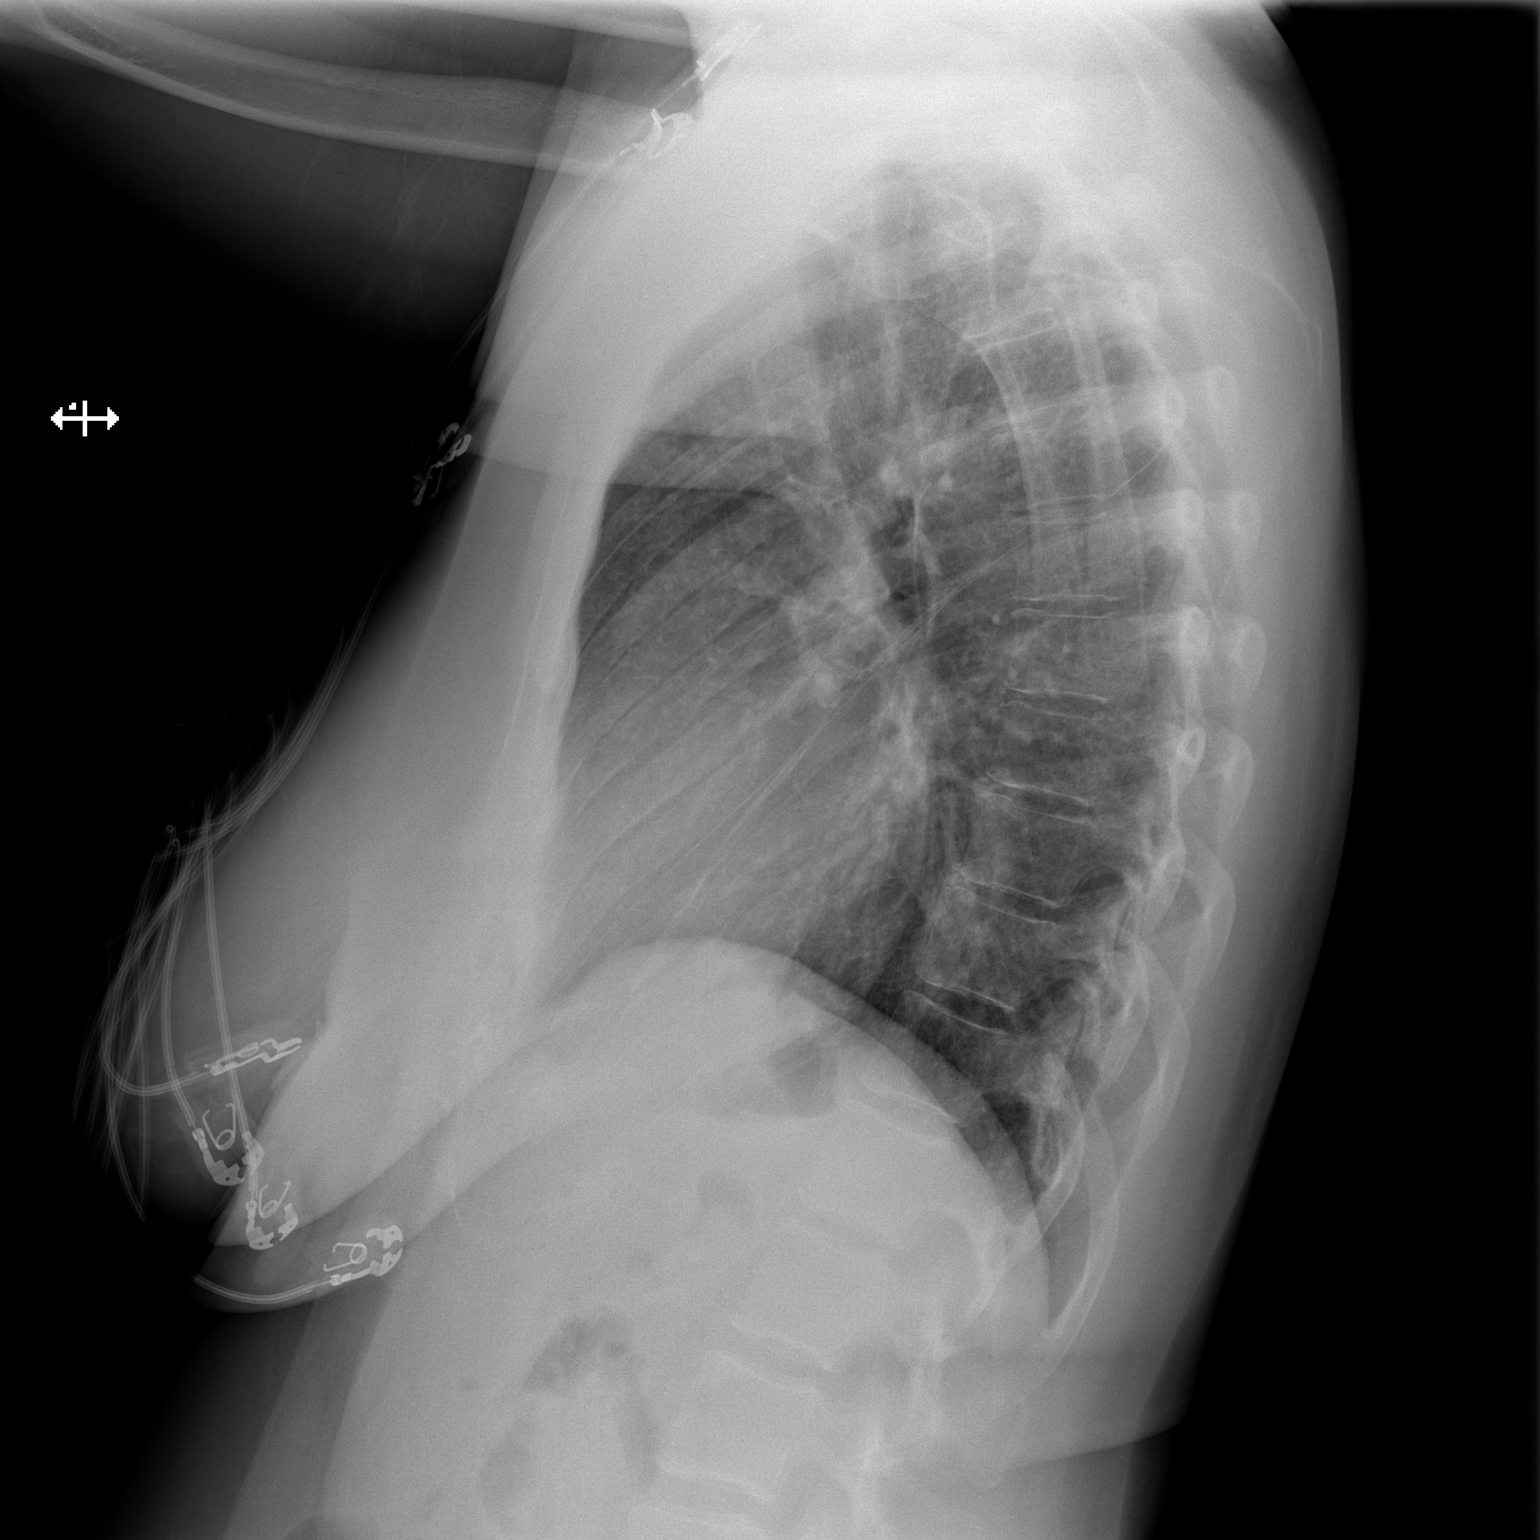

[2 of 2 positions shown; findings below may reference images not displayed]

FINDINGS: The heart size and mediastinal contours are within normal limits.
Both lungs are clear. The visualized skeletal structures are
unremarkable.
IMPRESSION: No active cardiopulmonary disease.

## 2019-12-20 ENCOUNTER — Telehealth: Payer: Self-pay

## 2019-12-20 NOTE — Telephone Encounter (Signed)
**Note De-Identified  Obfuscation** Following message received through covermymeds:  Meredith Velez Key: [id number] - PA Case ID: [id number]  Outcome Approved today  Request Reference Number: [id number]. ENTRESTO TAB 97-103MG  is approved through 12/19/2020.  Your patient may now fill this prescription and it will be covered.  Drug Entresto 97-103MG  tablets  Form[employer]umRx Electronic Prior Authorization Form (2017 NCPDP)  I have notified [facility] pharmacy and the pt of this approval. The pt expressed much gratitude towards [place] for acting so quickly in getting her Sherryll Burger approved for coverage.

## 2019-12-20 NOTE — Telephone Encounter (Signed)
**Note De-Identified  Obfuscation** I have done an urgent Entresto 97-103 mg PA through covermymeds. Key: [id number]

## 2019-12-20 NOTE — Telephone Encounter (Signed)
Pt calling stating that she needs a prior auth on her medication Entresto 97-103 mg tablet. Pt states that she does not have any medication at this time. Please address

## 2020-01-05 IMAGING — CT CT HEAD W/O CM
4 series · 17 of 47 positions shown, 19 images · non-contrast
Comparison: 10/21/2018

CLINICAL DATA: Severe headaches, cough and nasal congestion.

EXAM:
CT HEAD WITHOUT CONTRAST
TECHNIQUE: Contiguous axial images were obtained from the base of the skull
through the vertex without intravenous contrast.

[Series 3: head wo · axial · 0.41mm/px · z∈[-154,-38]mm · 7 of 31 slices shown, 9 images]
[im 4/31  brain]
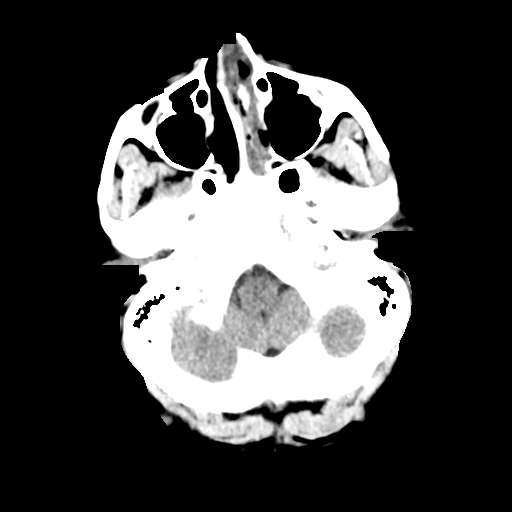
[im 4/31  bone]
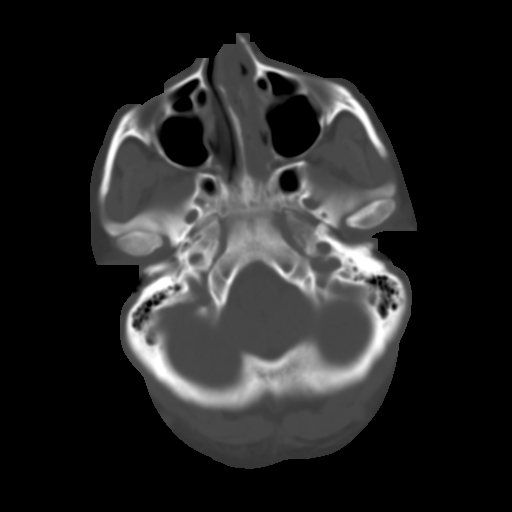
[im 8/31  brain]
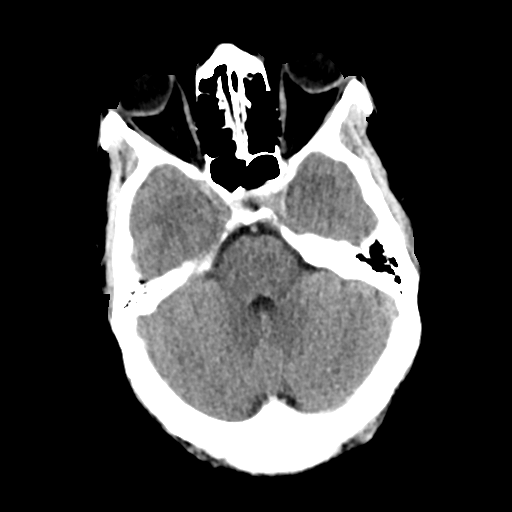
[im 12/31  brain]
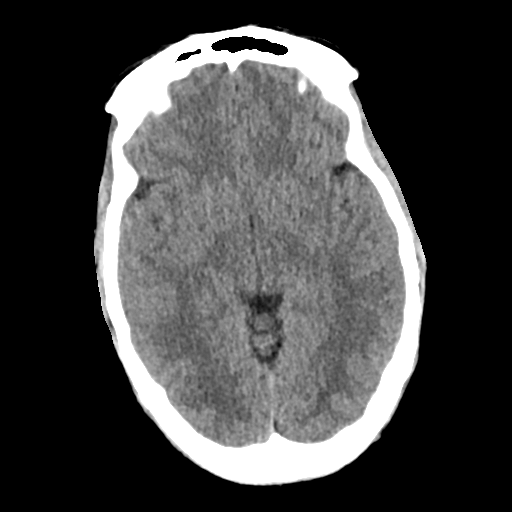
[im 16/31  brain]
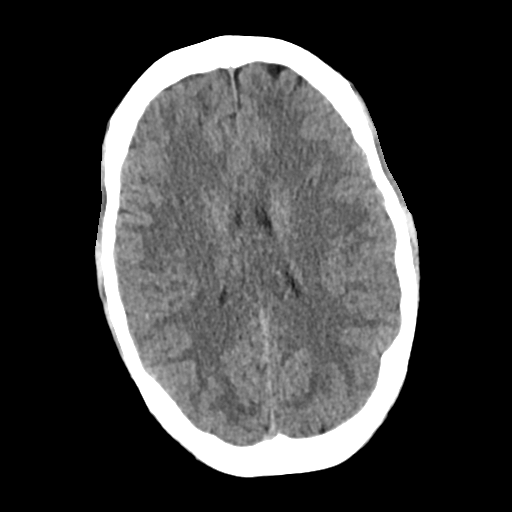
[im 19/31  brain]
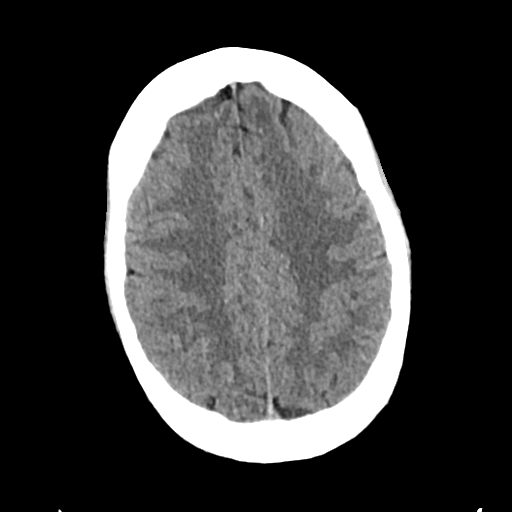
[im 19/31  bone]
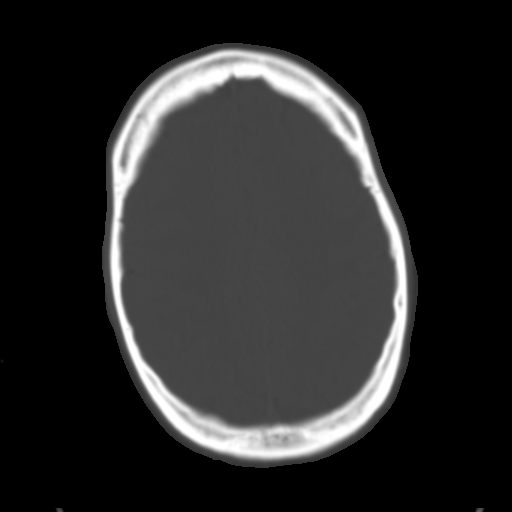
[im 23/31  brain]
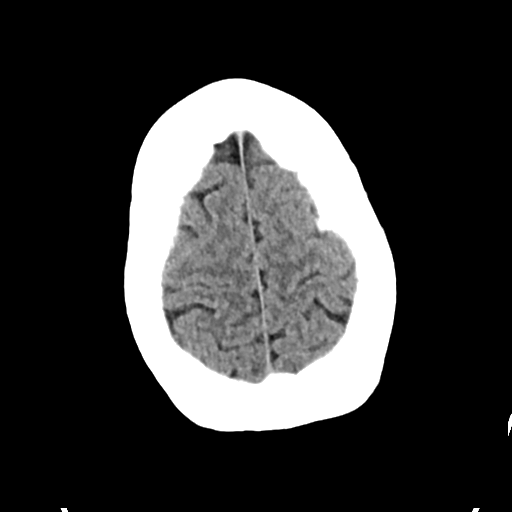
[im 27/31  brain]
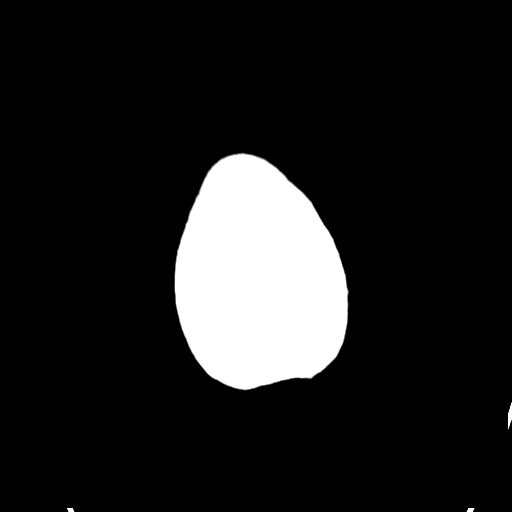

[Series 4: head bone · axial · 0.41mm/px · z∈[-154,-100]mm · 4 of 77 slices shown]
[im 8/77  bone]
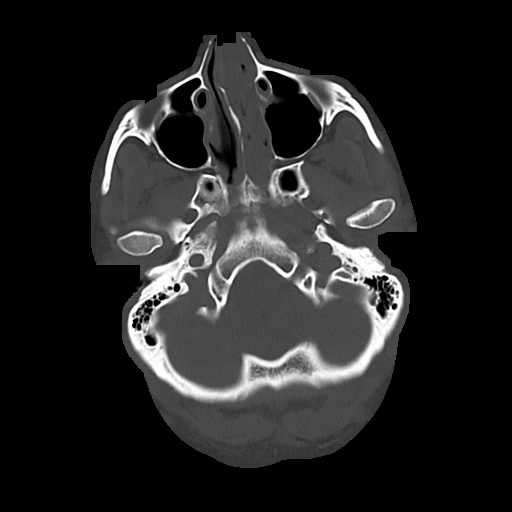
[im 16/77  bone]
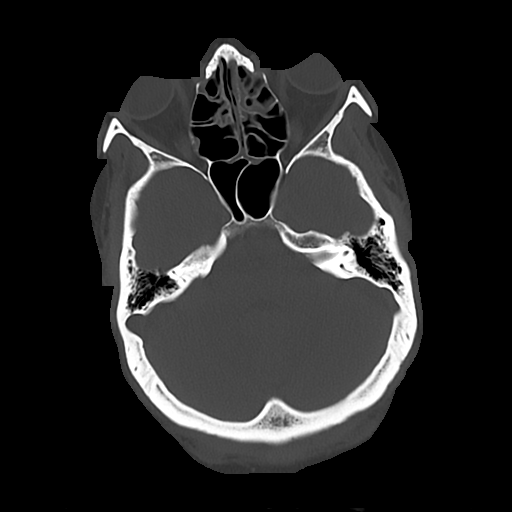
[im 23/77  bone]
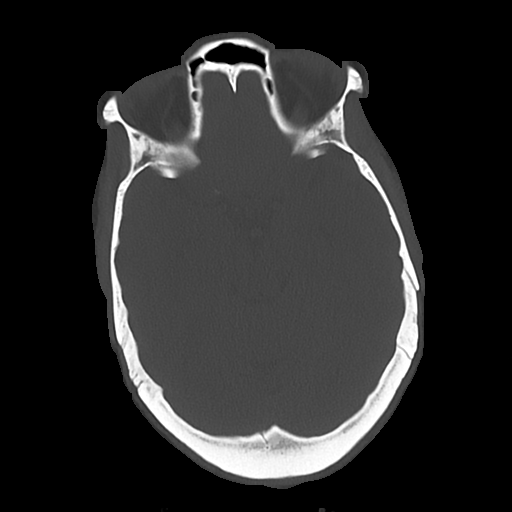
[im 35/77  bone]
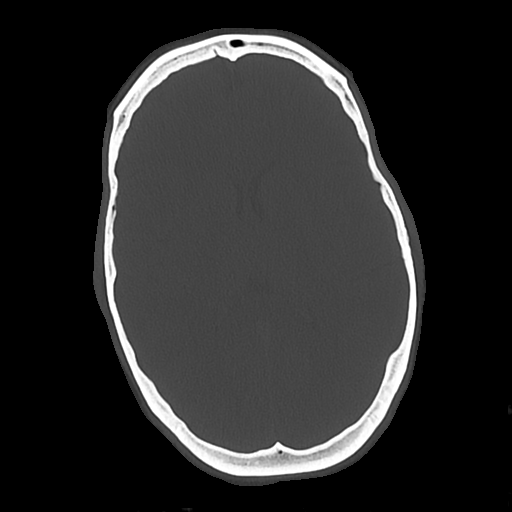

[Series 5: cor soft · coronal · 0.31mm/px · 3 of 70 slices shown]
[im 24/70  brain]
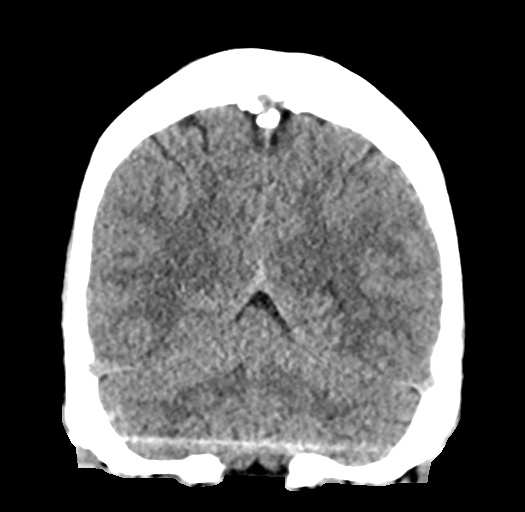
[im 31/70  brain]
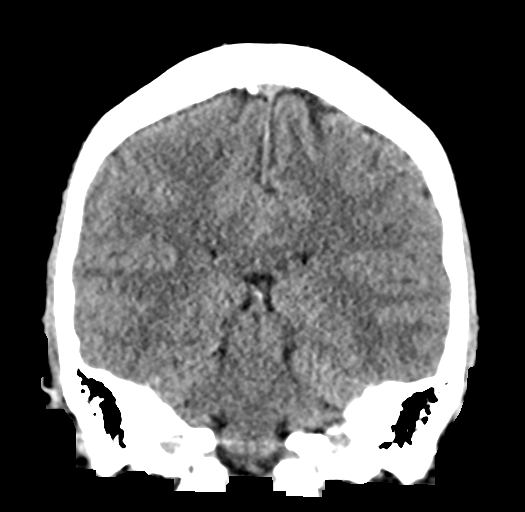
[im 39/70  brain]
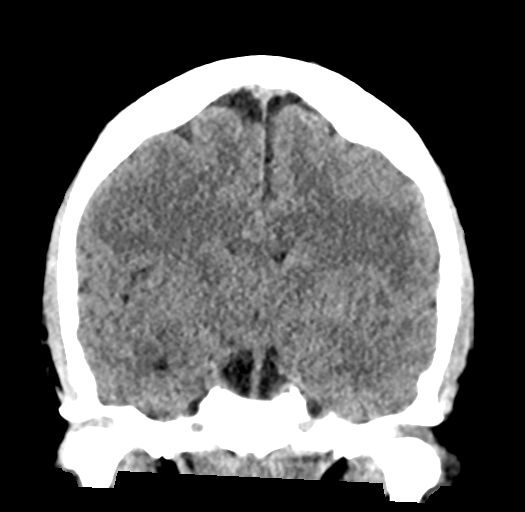

[Series 6: sag soft · sagittal · 0.31mm/px · 3 of 55 slices shown]
[im 19/55  brain]
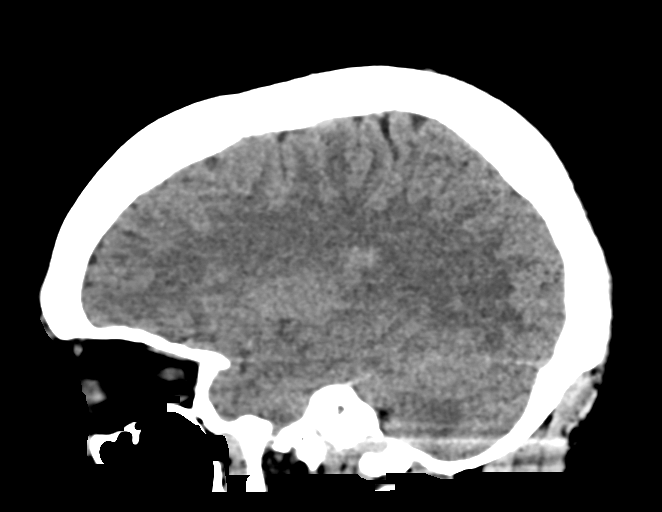
[im 28/55  brain]
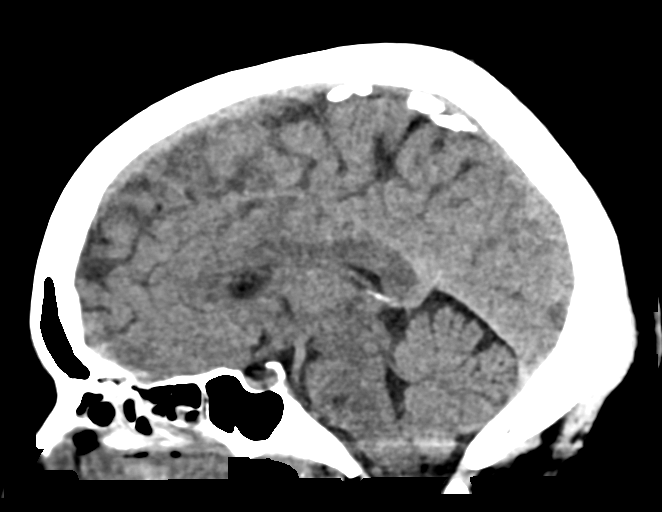
[im 37/55  brain]
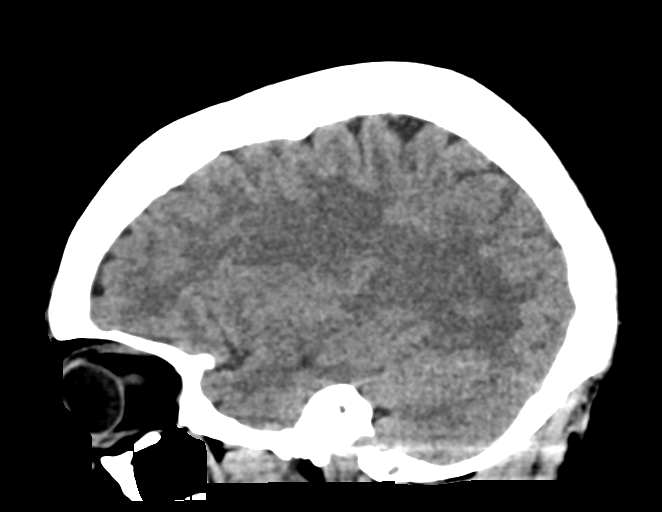

[17 of 47 positions shown; findings below may reference images not displayed]

FINDINGS: Brain: No evidence of acute infarction, hemorrhage, hydrocephalus,
extra-axial collection or mass lesion/mass effect.

Vascular: No hyperdense vessel or unexpected calcification.

Skull: Normal. Negative for fracture or focal lesion.

Sinuses/Orbits: Partial opacification of bilateral ethmoid air cells
noted. There is mild mucosal thickening involving the right
maxillary sinus. The mastoid air cells are clear.

Other: None
IMPRESSION: 1. Normal brain.
2. Sinus inflammation.

## 2020-02-06 ENCOUNTER — Other Ambulatory Visit: Payer: Self-pay

## 2020-02-06 ENCOUNTER — Ambulatory Visit (HOSPITAL_COMMUNITY): Payer: 59 | Attending: Cardiology

## 2020-02-06 DIAGNOSIS — O903 Peripartum cardiomyopathy: Secondary | ICD-10-CM | POA: Insufficient documentation

## 2020-02-06 LAB — ECHOCARDIOGRAM COMPLETE
Area-P 1/2: 5.62 cm2
S' Lateral: 4 cm

## 2020-02-09 ENCOUNTER — Telehealth: Payer: Self-pay

## 2020-02-09 NOTE — Telephone Encounter (Signed)
-----   Message from Filbert Schilder, NP sent at 02/08/2020 10:11 AM EDT ----- Please let Ms. Burgoon know that her echocardiogram is similar to prior studies. No changes needed at this time

## 2020-02-09 NOTE — Telephone Encounter (Signed)
The patient has been notified of the result and verbalized understanding.  All questions (if any) were answered. Leanord Hawking, RN 02/09/2020 8:15 AM

## 2020-02-26 ENCOUNTER — Encounter: Payer: Self-pay | Admitting: Family Medicine

## 2020-02-26 ENCOUNTER — Telehealth (INDEPENDENT_AMBULATORY_CARE_PROVIDER_SITE_OTHER): Payer: 59 | Admitting: Family Medicine

## 2020-02-26 DIAGNOSIS — Z7189 Other specified counseling: Secondary | ICD-10-CM

## 2020-02-26 DIAGNOSIS — J0100 Acute maxillary sinusitis, unspecified: Secondary | ICD-10-CM | POA: Diagnosis not present

## 2020-02-26 MED ORDER — AMOXICILLIN 500 MG PO TABS: 500.0000 mg | ORAL_TABLET | Freq: Two times a day (BID) | ORAL | 0 refills | Status: AC

## 2020-02-26 NOTE — Progress Notes (Signed)
Virtual Visit via Video Note  I connected with Halford Chessman on 02/26/20 at  1:00 PM EDT by a video enabled telemedicine application 2/2 [id number] pandemic and verified that I am speaking with the correct person using two identifiers.  Location patient: car Location provider:work or home office Persons participating in the virtual visit: patient, provider  I discussed the limitations of evaluation and management by telemedicine and the availability of in person appointments. The patient expressed understanding and agreed to proceed.   HPI: Pt is a 34 yo female with pmh sig for migraines, HTN, post partum cardiomyopathy/CHF, h/o postpartum depression, GERD seen for acute concern.   Pt having nasal congestion and HAs x 1 wk.  Also with intermittent ear pain and pressure.Had a evisit with [employer], given ipatropium nasal spray TID. Denies fever, chills, sore throat, cough, n/v, diarrhea.   Pt has not tried anything else for her symptoms.  States her symptoms improved after taking the spray but returned.  Pt's 110 yo son tested positive for strep today.  RSV was negative.  Pediatrician did not tested for COVID, but advised would check in 3 days if symptoms continued.  ROS: See pertinent positives and negatives per HPI.  Past Medical History:  Diagnosis Date  . Anemia   . Anxiety   . Depression    pp depression  . Frequent headaches   . GERD (gastroesophageal reflux disease)   . Hypertension   . Migraines   . 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 Seasonal allergies   . Ulcer, stomach peptic   . UTI (lower urinary tract infection)   . Wears glasses     Past Surgical History:  Procedure Laterality Date  . COLONOSCOPY    . LAPAROSCOPIC BILATERAL SALPINGECTOMY Bilateral 01/04/2019   Procedure: LAPAROSCOPIC BILATERAL SALPINGECTOMY;  Surgeon: Everett Graff, MD;  [facility];  Service: Gynecology;  Laterality: Bilateral;    Family History  Problem  Relation Age of Onset  . Hypertension Mother   . Hyperlipidemia Mother   . Heart disease Mother   . Kidney disease Mother   . Diabetes Mother   . Hypertension Father   . Stroke Father   . Diabetes Father   . Hypertension Brother   . Breast cancer Maternal Aunt   . Lung cancer Maternal Uncle   . Prostate cancer Maternal Grandmother   . Prostate cancer Maternal Grandfather   . Alzheimer's disease Paternal Grandmother      Current Outpatient Medications:  .  acetaminophen (TYLENOL) 325 MG tablet, Take 2 tablets (650 mg total) by mouth every 4 (four) hours as needed (for pain scale < 4)., Disp: 60 tablet, Rfl: 0 .  cetirizine-pseudoephedrine (ZYRTEC-D) 5-120 MG tablet, Take 1 tablet by mouth daily., Disp: 30 tablet, Rfl: 0 .  fluticasone (FLONASE) 50 MCG/ACT nasal spray, Place 2 sprays into both nostrils daily., Disp: 11.1 mL, Rfl: 0 .  furosemide (LASIX) 20 MG tablet, Take 20 mg by mouth every other day., Disp: , Rfl:  .  metoprolol succinate (TOPROL-XL) 100 MG 24 hr tablet, Take 1 tablet (100 mg total) by mouth daily. Take with or immediately following a meal., Disp: 90 tablet, Rfl: 3 .  sacubitril-valsartan (ENTRESTO) 97-103 MG, Take 1 tablet by mouth 2 (two) times daily., Disp: 60 tablet, Rfl: 11 .  Sodium Chloride-Sodium Bicarb (NETI POT SINUS WASH) 2300-700 MG KIT, Use for nasal congestion  1-2 times daily, Disp: 1 kit, Rfl: 0 .  spironolactone (ALDACTONE) 25 MG tablet, Take 0.5  tablets (12.5 mg total) by mouth daily., Disp: 60 tablet, Rfl: 4 .  valACYclovir (VALTREX) 1000 MG tablet, Take 1 tablet by mouth as needed., Disp: , Rfl:   EXAM:  VITALS per patient if applicable:  RR between 12-20 bpm  GENERAL: alert, oriented, appears well, mildly fatigued and in no acute distress.  Sounds congested during conversation.  HEENT: atraumatic, conjunctiva clear, no obvious abnormalities on inspection of external nose and ears  NECK: normal movements of the head and neck  LUNGS: on  inspection no signs of respiratory distress, breathing rate appears normal, no obvious gross SOB, gasping or wheezing  CV: no obvious cyanosis  MS: moves all visible extremities without noticeable abnormality  PSYCH/NEURO: pleasant and cooperative, no obvious depression or anxiety, speech and thought processing grossly intact  ASSESSMENT AND PLAN:  Discussed the following assessment and plan:  Acute maxillary sinusitis, recurrence not specified  -Okay to continue ipratropium nasal spray -Continue other supportive care including Tylenol as needed for headache -Send Rx for amoxicillin twice daily x7 days -Given precautions - Plan: amoxicillin (AMOXIL) 500 MG tablet  Educated about COVID-19 virus infection -Discussed signs/symptoms of COVID-19 virus infection -Discussed obtaining Covid testing for worsening or continued symptoms -Discussed area locations that provide Covid test  Follow-up as needed   I discussed the assessment and treatment plan with the patient. The patient was provided an opportunity to ask questions and all were answered. The patient agreed with the plan and demonstrated an understanding of the instructions.   The patient was advised to call back or seek an in-person evaluation if the symptoms worsen or if the condition fails to improve as anticipated.  I provided 14 minutes of non-face-to-face time during this encounter.   Billie Ruddy, MD

## 2020-02-28 ENCOUNTER — Telehealth: Payer: Self-pay | Admitting: Cardiology

## 2020-02-28 MED ORDER — SACUBITRIL-VALSARTAN 49-51 MG PO TABS: 1.0000 | ORAL_TABLET | Freq: Two times a day (BID) | ORAL | 6 refills | Status: DC

## 2020-02-28 NOTE — Telephone Encounter (Signed)
Decrease entresto to 49/51 BID  Thanks  Donato Schultz, MD

## 2020-02-28 NOTE — Telephone Encounter (Signed)
Will have Dr Skains review and advise. 

## 2020-02-28 NOTE — Telephone Encounter (Signed)
Pt c/o medication issue:  1. Name of Medication: sacubitril-valsartan (ENTRESTO) 97-103 MG  2. How are you currently taking this medication (dosage and times per day)? Has not taken since 02/25/20  3. Are you having a reaction (difficulty breathing--STAT)? Yes  4. What is your medication issue? Bryndle is calling stating she has not been taking this medication like she should and when she began taking it again it caused her to have fatigue and nausea. She states she tried it again and had the same reaction. Due to this she has not taken the medication since Sunday. She is wanting to know if she should start back taking it at a lower dose until her body adjust to taking the medication again. Please advise.

## 2020-02-28 NOTE — Telephone Encounter (Signed)
Spoke with pt who reports she has been feeling depressed and overwhelmed with all the medications she is supposed to be taking.  She has missed several doses and when she tried to restart she had nausea and fatigue.  Of note - she also states she was restarting her blood pressure medication as well.  Advised of Dr Anne Fu order to restart at 49-51 mg.  Pt sates standing and is agreeable to this. She reports she does still have some of this dose at home and does not need a new RX at this time.  Advised I will send a rx into her pharmacy to hold on file until she needs it. Pt states understanding and was grateful for the call back and information.

## 2020-03-14 ENCOUNTER — Other Ambulatory Visit: Payer: Self-pay | Admitting: Cardiology

## 2020-03-26 ENCOUNTER — Encounter: Payer: Self-pay | Admitting: Cardiology

## 2020-03-26 ENCOUNTER — Other Ambulatory Visit: Payer: Self-pay

## 2020-03-26 ENCOUNTER — Ambulatory Visit: Payer: 59 | Admitting: Cardiology

## 2020-03-26 VITALS — BP 134/90 | HR 100 | Ht 62.0 in | Wt 178.0 lb

## 2020-03-26 DIAGNOSIS — I10 Essential (primary) hypertension: Secondary | ICD-10-CM

## 2020-03-26 DIAGNOSIS — O903 Peripartum cardiomyopathy: Secondary | ICD-10-CM | POA: Diagnosis not present

## 2020-03-26 NOTE — Progress Notes (Signed)
Cardiology Office Note:    Date:  03/26/2020   ID:  Meredith Velez, DOB 04-16-1986, MRN [id number]  PCP:  Deeann Saint, MD  [facility] Cardiologist:  Donato Schultz, MD  [facility] Electrophysiologist:  None   Referring MD: Deeann Saint, MD     History of Present Illness:    Meredith Velez is a 34 y.o. female here for heart failure follow-up.  She was feeling overwhelmed medications, we went ahead and decreased her Entresto to half dose.  She has peripartum cardiomyopathy.  Echocardiogram in August 2020 showed EF of 20%.  Repeat echocardiogram showed EF of 45%.  Also has comorbidities of anxiety depression migraines.  Has been struggling to take her medications.  Been overwhelmed.  Feeling depressed.  At times misses doses.  Had lengthy discussion.  Overall feels better when she is on the medications.  No chest pain no shortness of breath no orthopnea no syncope.  Past Medical History:  Diagnosis Date  . Anemia   . Anxiety   . Depression    pp depression  . Frequent headaches   . GERD (gastroesophageal reflux disease)   . Hypertension   . Migraines   . 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 Seasonal allergies   . Ulcer, stomach peptic   . UTI (lower urinary tract infection)   . Wears glasses     Past Surgical History:  Procedure Laterality Date  . COLONOSCOPY    . LAPAROSCOPIC BILATERAL SALPINGECTOMY Bilateral 01/04/2019   Procedure: LAPAROSCOPIC BILATERAL SALPINGECTOMY;  Surgeon: Osborn Coho, MD;  Location: [facility];  Service: Gynecology;  Laterality: Bilateral;    Current Medications: Current Meds  Medication Sig  . acetaminophen (TYLENOL) 325 MG tablet Take 2 tablets (650 mg total) by mouth every 4 (four) hours as needed (for pain scale < 4).  . cetirizine-pseudoephedrine (ZYRTEC-D) 5-120 MG tablet Take 1 tablet by mouth daily. (Patient taking differently: Take 1 tablet by mouth as needed. )  . furosemide (LASIX) 20  MG tablet Take 20 mg by mouth every other day.  . ipratropium (ATROVENT) 0.03 % nasal spray as needed.  . metoprolol succinate (TOPROL-XL) 100 MG 24 hr tablet Take 1 tablet (100 mg total) by mouth daily. Take with or immediately following a meal.  . sacubitril-valsartan (ENTRESTO) 49-51 MG Take 1 tablet by mouth 2 (two) times daily.  Marland Kitchen spironolactone (ALDACTONE) 25 MG tablet TAKE 1/2 TABLET BY MOUTH DAILY  . valACYclovir (VALTREX) 1000 MG tablet Take 1 tablet by mouth as needed.     Allergies:   Patient has no known allergies.   Social History   Socioeconomic History  . Marital status: Married    Spouse name: Not on file  . Number of children: Not on file  . Years of education: Not on file  . Highest education level: Not on file  Occupational History  . Occupation: Wellsite geologist: [employer]  Tobacco Use  . Smoking status: Never Smoker  . Smokeless tobacco: Never Used  Vaping Use  . Vaping Use: Never used  Substance and Sexual Activity  . Alcohol use: Yes    Comment: occ  . Drug use: No  . Sexual activity: Yes    Birth control/protection: None  Other Topics Concern  . Not on file  Social History Narrative  . Not on file   Social Determinants of Health   Financial Resource Strain:   . Difficulty of Paying Living Expenses: Not on file  Food Insecurity:   . Worried About Programme researcher, broadcasting/film/video in the Last Year: Not on file  . Ran Out of Food in the Last Year: Not on file  Transportation Needs:   . Lack of Transportation (Medical): Not on file  . Lack of Transportation (Non-Medical): Not on file  Physical Activity:   . Days of Exercise per Week: Not on file  . Minutes of Exercise per Session: Not on file  Stress:   . Feeling of Stress : Not on file  Social Connections:   . Frequency of Communication with Friends and Family: Not on file  . Frequency of Social Gatherings with Friends and Family: Not on file  . Attends Religious Services: Not on file    . Active Member of Clubs or Organizations: Not on file  . Attends Banker Meetings: Not on file  . Marital Status: Not on file     Family History: The patient's family history includes Alzheimer's disease in her paternal grandmother; Breast cancer in her maternal aunt; Diabetes in her father and mother; Heart disease in her mother; Hyperlipidemia in her mother; Hypertension in her brother, father, and mother; Kidney disease in her mother; Lung cancer in her maternal uncle; Prostate cancer in her maternal grandfather and maternal grandmother; Stroke in her father.  ROS:   Please see the history of present illness.     All other systems reviewed and are negative.  EKGs/Labs/Other Studies Reviewed:       Recent Labs: 06/10/2019: ALT 16; BUN 8; Creatinine, Ser 0.86; Hemoglobin 13.5; Platelets 327; Potassium 4.1; Sodium 137  Recent Lipid Panel    Component Value Date/Time   CHOL 126 05/11/2014 0937   TRIG 29.0 05/11/2014 0937   HDL 43.10 05/11/2014 0937   CHOLHDL 3 05/11/2014 0937   VLDL 5.8 05/11/2014 0937   LDLCALC 77 05/11/2014 0937    Physical Exam:    VS:  BP 134/90   Pulse 100   Ht 5\' 2"  (1.575 m)   Wt 178 lb (80.7 kg)   SpO2 99%   BMI 32.56 kg/m     Wt Readings from Last 3 Encounters:  03/26/20 178 lb (80.7 kg)  08/29/19 179 lb (81.2 kg)  06/27/19 176 lb 12.8 oz (80.2 kg)     GEN:  Well nourished, well developed in no acute distress HEENT: Normal NECK: No JVD; No carotid bruits LYMPHATICS: No lymphadenopathy CARDIAC: RRR, no murmurs, rubs, gallops RESPIRATORY:  Clear to auscultation without rales, wheezing or rhonchi  ABDOMEN: Soft, non-tender, non-distended MUSCULOSKELETAL:  No edema; No deformity  SKIN: Warm and dry NEUROLOGIC:  Alert and oriented x 3 PSYCHIATRIC:  Normal affect   ASSESSMENT:    1. Peripartum cardiomyopathy   2. Essential hypertension    PLAN:    In order of problems listed above:  Peripartum cardiomyopathy/HTN -EF  originally was 20%, repeat echo up to 45%. -Taking half dose/moderate dose Entresto.  Tubal ligation.  Overwhelmed with medications previously. -Worried that hypertension was playing a role in her cardiomyopathy.  Anxiety/depression -Previously 06/29/19 discussed this with her.  Suggested group classes at the [facility] to help with motivation.  Medication compliance.  We will have her see [facility], social worker for heart failure team.  Appreciate help.  Medication Adjustments/Labs and Tests Ordered: Current medicines are reviewed at length with the patient today.  Concerns regarding medicines are outlined above.  No orders of the defined types were placed in this encounter.  No orders of  the defined types were placed in this encounter.   Patient Instructions  Medication Instructions:  The current medical regimen is effective;  continue present plan and medications.  *If you need a refill on your cardiac medications before your next appointment, please call your pharmacy*  Follow-Up: At [facility], you and your health needs are our priority.  As part of our continuing mission to provide you with exceptional heart care, we have created designated Provider Care Teams.  These Care Teams include your primary Cardiologist (physician) and Advanced Practice Providers (APPs -  Physician Assistants and Nurse Practitioners) who all work together to provide you with the care you need, when you need it.  We recommend signing up for the patient portal called "MyChart".  Sign up information is provided on this After Visit Summary.  MyChart is used to connect with patients for Virtual Visits (Telemedicine).  Patients are able to view lab/test results, encounter notes, upcoming appointments, etc.  Non-urgent messages can be sent to your provider as well.   To learn more about what you can do with MyChart, go to [email or web address].    Your next appointment:   3 month(s)  The format for  your next appointment:   In Person  Provider:   Georgie Chard, NP   Thank you for choosing [facility]!!    You will be contacted by [name], RN.  She is the Congestive Heart Failure Child psychotherapist.      Signed, Donato Schultz, MD  03/26/2020 3:21 PM    [facility]

## 2020-03-26 NOTE — Patient Instructions (Addendum)
Medication Instructions:  The current medical regimen is effective;  continue present plan and medications.  *If you need a refill on your cardiac medications before your next appointment, please call your pharmacy*  Follow-Up: At [facility], you and your health needs are our priority.  As part of our continuing mission to provide you with exceptional heart care, we have created designated Provider Care Teams.  These Care Teams include your primary Cardiologist (physician) and Advanced Practice Providers (APPs -  Physician Assistants and Nurse Practitioners) who all work together to provide you with the care you need, when you need it.  We recommend signing up for the patient portal called "MyChart".  Sign up information is provided on this After Visit Summary.  MyChart is used to connect with patients for Virtual Visits (Telemedicine).  Patients are able to view lab/test results, encounter notes, upcoming appointments, etc.  Non-urgent messages can be sent to your provider as well.   To learn more about what you can do with MyChart, go to [email or web address].    Your next appointment:   3 month(s)  The format for your next appointment:   In Person  Provider:   Georgie Chard, NP   Thank you for choosing [facility]!!    You will be contacted by [name], RN.  She is the Congestive Heart Failure Child psychotherapist.

## 2020-03-28 ENCOUNTER — Telehealth: Payer: Self-pay | Admitting: Licensed Clinical Social Worker

## 2020-03-28 NOTE — Telephone Encounter (Signed)
CSW received referral to assist patient with supportive intervention around depression and adjustment to HF diagnosis. CSW spoke with patient via phone and she states that she is currently at work and unavailable to talk at this time. CSW and patient scheduled phone call for next Tuesday September 28 at [facility] [name], [place], CCSW-MCS [phone]

## 2020-04-02 ENCOUNTER — Telehealth: Payer: Self-pay | Admitting: Licensed Clinical Social Worker

## 2020-04-02 NOTE — Progress Notes (Signed)
Heart and Vascular Care Navigation  04/02/2020  Meredith Velez Feb 19, 1986 [id number]  Reason for Referral: CSW referred to assist with coping due to anxiety and depressive symptoms.                                                                            Assessment:   Patient is a 34yo married female with 2 young children (7yo and 36yo). She states she was diagnosed with  Cardiomyopathy after her second child was born. She initially states she had a good response to treatment  With an increase in her EF from 20% to 45%. She states recently she was told that "you are one of our young ones with HF" and she began to think about the diagnosis and struggled with depression. She states no one else my age has to deal with illness at my age. She mentioned that she feels "guilty if I can't do it all". She acknowledged that she feels she has to be superwoman and her husband offers to help but she feels like it is her responsibility and feels guilty. 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 Patient became tearful during the conversation and stated she was grateful for the call and supportive intervention to "get back on track".                               HRT/VAS Care Coordination    Patients Home Cardiology Office [facility]   Outpatient Care Team Social Worker   Social Worker Name: [name], [place] [phone]   Living arrangements for the past 2 months Single Family Home   Lives with: Spouse; Minor Children  Patient has a Building surveyor and 34yo   Patient Current Optometrist   Patient Has Concern With Paying Medical Bills No   Does Patient Have Prescription Coverage? Yes   Home Assistive Devices/Equipment Scales      Social History:                                                                             SDOH Screenings   Alcohol Screen:   . Last Alcohol Screening Score (AUDIT): Not on file  Depression  (PHQ2-9):   . PHQ-2 Score: Not on file  Financial Resource Strain:   . Difficulty of Paying Living Expenses: Not on file  Food Insecurity: No Food Insecurity  . Worried About Programme researcher, broadcasting/film/video in the Last Year: Never true  . Ran Out of Food in the Last Year: Never true  Housing: Low Risk   . Last Housing Risk Score: 0  Physical Activity:   . Days of Exercise per Week: Not on file  . Minutes of Exercise per Session: Not on file  Social Connections:   . Frequency of Communication with Friends and Family:  Not on file  . Frequency of Social Gatherings with Friends and Family: Not on file  . Attends Religious Services: Not on file  . Active Member of Clubs or Organizations: Not on file  . Attends Banker Meetings: Not on file  . Marital Status: Not on file  Stress: Stress Concern Present  . Feeling of Stress : Very much  Tobacco Use: Low Risk   . Smoking Tobacco Use: Never Smoker  . Smokeless Tobacco Use: Never Used  Transportation Needs: No Transportation Needs  . Lack of Transportation (Medical): No  . Lack of Transportation (Non-Medical): No   SDOH Interventions   Health Promotion Interventions:     Physical Inactivity Clinical Exercise Physiologist for health coaching and discussion about safe home exercises and/or starting an exercise program.  Smoking Cessation n/a  Dietary Concerns Will refer to Care Guide for further support and coping around healthy eating.  Health Coaching Patient referred to Care Guide for health coaching regarding health goals around medication and diet.    Other Care Navigation Interventions:     Inpatient/Outpatient Substance Abuse Counseling/Rehab Options N/A  Provided Pharmacy assistance resources  N/A  Patient expressed Mental Health concerns Yes, Referred to:  CSW will continue to meet with patient via phone visits for supportive intervention and coping skills.   Patient Referred to: CSW, Exercise Coach and Care Guide    Follow-up plan:  CSW provided supportive intervention and discussed coping skills around adjustment to illness and disease management. Patient will be referred to Exercise and Wellness Coach and Care Guide for added support and improved disease management.   CSW continues to follow and will meet next Tuesday April 08, 2020 at [facility]  [name], [place], CCSW-MCS [phone]

## 2020-04-03 NOTE — Telephone Encounter (Signed)
Thank you Diona Browner

## 2020-04-05 ENCOUNTER — Telehealth: Payer: Self-pay

## 2020-04-05 DIAGNOSIS — Z0000 Encounter for general adult medical examination without abnormal findings: Secondary | ICD-10-CM

## 2020-04-05 NOTE — Telephone Encounter (Signed)
Called patient to determine if she was interested in health coaching for improving eating behaviors. Patient is interested in starting health coaching to improve heart health/hypertension, and health eating.   Patient states that she lacks motivation but knows what to do. Patient was doing well with eating and physical activity before the diagnosis. Patient stated that it has been tough with receiving diagnosis.   Patient has been scheduled for an initial health coaching appt on Oct 5th at 5:30pm to accommodate her work schedule.

## 2020-04-09 ENCOUNTER — Telehealth: Payer: Self-pay | Admitting: Licensed Clinical Social Worker

## 2020-04-09 ENCOUNTER — Other Ambulatory Visit: Payer: Self-pay

## 2020-04-09 ENCOUNTER — Ambulatory Visit (INDEPENDENT_AMBULATORY_CARE_PROVIDER_SITE_OTHER): Payer: 59

## 2020-04-09 DIAGNOSIS — Z0000 Encounter for general adult medical examination without abnormal findings: Secondary | ICD-10-CM

## 2020-04-09 NOTE — Progress Notes (Signed)
Appointment Outcome: No Show

## 2020-04-09 NOTE — Telephone Encounter (Signed)
CSW contacted patient to follow up on last week's call. Patient shared she had a good week and is working on ways to be more compliant with her medications. She shared that she is feeling more motivated since our call last week and has an appointment later today with the Care guide to discuss health goals. Patient was very grateful and feels like "I am on the right road now". She said she felt heard and prior was feeling like she was alone in this HF journey but now knows she is not alone. CSW continues to provide supportive intervention and will follow and be available as needed. [name], LCSW, CCSW-MCS [phone]

## 2020-04-10 ENCOUNTER — Telehealth: Payer: Self-pay | Admitting: *Deleted

## 2020-04-10 NOTE — Telephone Encounter (Addendum)
Patient referred to Clinical Exercise Physiologist by CSW for guidance and discussion about safe home exercises and/or starting an exercise program. Exercises were demonstrated and detailed with safety precautions to patient and accompanying caregiver (if present). Patient was presented with an information packet including demonstrations of the exercises discussed. All patient's questions were answered and patient was given contact information for further questions or concerns regarding their exercise.       [place]-Exercise and Wellness Coaching/Programming                 ? Why have you decided to seek exercise guidance at this time? (please be specific) - Wants to improver prognosis with peripartum cardiomyopathy- increase her energy and feel better overall  ? Who referred you to this program? - CSW- [name] via [place] Navigation via [employer]    ? Referring Provider (Cardiologist) and last office visit? - Donato Schultz- Cardiology- [facility]     Activity &Health History  YES NO DESCRIPTION:  1. Do you participate in a regular exercise program or have you in the past? Or had a personal trainer? ? ? Self-program of walking/dancing 3x a week up until recently  2. Can you currently walk a mile without fatigue or shortness of breath? ? ? slowly  3. Can you walk a flight of stairs without shortness of breath or fatigue? ? ?   4. Are you able to perform ADLs without getting short of breath? ? ?   5. Have you performed strength training in the past? ? ?   6. Do you have musculoskeletal limitations/disabilities that interfere with exercising? If so, describe.  ? ?   7. Do you smoke? ? ?   8. Do you take medications that affect your heart rate, blood pressure, etc. ? ? ? Beta-blocker  9. Do you have diabetes?  ? ?   10. Do you have chronic kidney disease? ? ?        ? Personal health and fitness goals:  o To improve fitness and HF prognosis and decrease  side-effects of heart failure ? Long- term Goals: overall improved health and wellness with lifestyle changes to sustain independently ? Short-Term Goals: re-initiate and re-motivate for increased physical activity ? Subsequent Follow-up Session Schedule:  o Initial session: 04/10/2020 o Next Session 10/13 at 11:00am Telephonically  Time Spent telephonically with patient today: 45 minutes     Meredith Hausen, MS, ACSM, [facility] Clinical Exercise Physiologist/ Health and Wellness Coach

## 2020-04-10 NOTE — Progress Notes (Signed)
Appointment on 04/09/20 @ 5:30pm  Appointment Outcome:  Completed, Session #: Initial Health Coaching Session - Telephone  AGREEMENTS SECTION  Overall Goal(s): Over the course of three months the patient wants to: Eat healthier daily to improve heart health Reduce daily stress Lose at least 20 - 25 pounds (2 pounds per/week) - Ultimate goal is between (150 - 160).                                        Agreement/Action Steps:  - Patient will wind down in the evening by writing daily in a gratitude   journal - Patient will start meal planning so that she can meal prep 2-3 days at   a time.  - Patient will begin to eat a light breakfast - Patient will incorporate positive self-talk/affirmations and visualize her   success - Patient will start walking 10-15 minutes on days she does not engage   in the exercise program with Lesia Hausen.  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 Patient stated that currently she lacks motivation and the confidence. Patient try to tell herself positive self-affirmations sometimes.   Patient is not a breakfast eater and prefers brunch. Patient stated that she sometimes takes a 10-15 minute walk around her block or sometimes around the entire housing development, but is not sure how long that takes.   Patient has meal prepped in the past with her husband and one day they stopped, and she doesn't know why. Patient currently states that a major problem for her is not knowing what to eat that is healthy for her heart. Patient stated that she was told by her provider that she needs to lose weight but not the amount. Patient stated that she is currently 178 and would like to be between 150-160 within 3 months.  Patient is not getting adequate rest because her mind is racing. Patient constantly thinks about what the kids need, what needs to be done the next day at home, work, etc.   Patient stated that  her reasoning for wanting to change her behavior is being present for her kids and being in a better place overall with her health, while improving her confidence.  . Indicators of Success and Accountability:  What are the mile markers along your path to reaching your desired changes  . Readiness: Patient is in the preparation stage of behavior change. . Strengths and Supports: Patient has engaged in healthy behaviors before and will draw upon those experiences. Patient's husband will be a great support on her health journey because he has engaged in her healthy habits previously. . Challenges and Barriers: Stress, lack of confidence and motivation, and not knowing what to eat.  Coaching Outcomes: Patient is aware of the importance of eating breakfast and will start eating a light breakfast such as a piece of toast, boiled egg, and some fruit. Patient will incorporate positive self-talk/affirmations to increase her motivation and confidence in being able to make these health behavior changes.   Patient will continue walking 10-15 minutes around her neighborhood to help ease her back into physical activity while she waits to start the exercise program with Lesia Hausen. She will continue walking the same duration until she can increase the time frame on the days that she does not have to participate in the program.  Patient was given a meal planner that she will use to aid in listing ingredients that she needs for various recipes and other healthy options. This will help the patient in meal prepping throughout the week.  Patient will begin to write in a gratitude journal nightly as a wind down technique to help calm the mind before bedtime to aid in reducing racing thoughts to improve sleep.  Patient enthusiasm improved over the course of the session, and she stated that she is excited to get started with these steps. Her major takeaway from the session was her "why" for wanting to change her health  behaviors.

## 2020-04-11 NOTE — Telephone Encounter (Signed)
Thank you!  OK to participate in exercise.   Donato Schultz, MD

## 2020-04-13 ENCOUNTER — Other Ambulatory Visit: Payer: Self-pay | Admitting: Cardiology

## 2020-04-17 ENCOUNTER — Telehealth: Payer: Self-pay | Admitting: *Deleted

## 2020-04-17 NOTE — Telephone Encounter (Addendum)
    Progress report and new FITT ex rx emailed to patient.   Time spent telephonically coaching patient: 30 minutes   Lesia Hausen, MS, ACSM, [facility] Clinical Exercise Physiologist/ Health and Wellness Coach

## 2020-04-23 ENCOUNTER — Ambulatory Visit: Payer: Self-pay

## 2020-04-23 ENCOUNTER — Telehealth: Payer: Self-pay

## 2020-04-23 DIAGNOSIS — Z0000 Encounter for general adult medical examination without abnormal findings: Secondary | ICD-10-CM

## 2020-04-23 NOTE — Telephone Encounter (Signed)
Called patient at 5:30pm for health coaching session. Left patient a message to call back.

## 2020-04-26 ENCOUNTER — Telehealth: Payer: Self-pay | Admitting: *Deleted

## 2020-04-26 NOTE — Telephone Encounter (Signed)
Contacted patient for follow-up for exercise and wellness coaching. Called all numbers in medical record, not able to leave a message. Will continue attempting to reach patient.     Lesia Hausen, MS, ACSM-RCEP, [facility] Clinical Exercise Physiologist/ Health and Wellness Coach

## 2020-05-01 ENCOUNTER — Telehealth (HOSPITAL_COMMUNITY): Payer: Self-pay | Admitting: *Deleted

## 2020-05-01 ENCOUNTER — Telehealth: Payer: Self-pay | Admitting: Licensed Clinical Social Worker

## 2020-05-01 NOTE — Telephone Encounter (Signed)
CSW contacted patient to follow up and provide supportive counseling. Patient shared that she had a rough week last week but continues to think about previous conversations with CSW and has found ways to provide some self affirmation. She stated "I got this". She missed last week's sessions with Care Guide and states she plans to follow up with her. CSW discussed recent cooking show videos and will forward them to patient via email. Patient is very excited about participating in a nutrition pilot offered through the H&V service line in the coming months. CSW provided supportive intervention and will continue to follow as needed. Patient appeared in good spirits and sounded very motivated for improved health. CSW continues to be available. [name], LCSW, CCSW-MCS [phone]

## 2020-05-01 NOTE — Telephone Encounter (Signed)
     Time spent telephonically with patient: 30 minutes    Lesia Hausen, MS, ACSM, [facility] Clinical Exercise Physiologist/ Health and Wellness Coach

## 2020-05-08 ENCOUNTER — Telehealth (HOSPITAL_COMMUNITY): Payer: Self-pay | Admitting: *Deleted

## 2020-05-08 NOTE — Telephone Encounter (Signed)
-   Has had too much going on-Has not exercised at all. Other family health issues  - Considered hanging it up and moving on and walking away but decided to acknowledge where she is and try again - Still taking medicine- she is not allowing herself to go to a dark place in the mix of this  - Patient wants to continue and resume the previous plan from 10/27 (see encounter note for ongoing home Ex Rx plan) - Next appointment 11/10  at 11am  Time spent telephonically with patient: 10 mins   Lesia Hausen, MS, ACSM, [facility] Clinical Exercise Physiologist/ Health and Wellness Coach

## 2020-05-15 ENCOUNTER — Telehealth: Payer: Self-pay

## 2020-05-15 DIAGNOSIS — Z0000 Encounter for general adult medical examination without abnormal findings: Secondary | ICD-10-CM

## 2020-05-15 NOTE — Telephone Encounter (Signed)
Called patient after receiving email from her to reschedule her health coaching f/u appt. Patient is scheduled for 05/20/20 at 6pm.

## 2020-05-20 ENCOUNTER — Telehealth: Payer: Self-pay

## 2020-05-20 ENCOUNTER — Other Ambulatory Visit: Payer: Self-pay

## 2020-05-20 ENCOUNTER — Ambulatory Visit (INDEPENDENT_AMBULATORY_CARE_PROVIDER_SITE_OTHER): Payer: 59

## 2020-05-20 DIAGNOSIS — Z0000 Encounter for general adult medical examination without abnormal findings: Secondary | ICD-10-CM

## 2020-05-20 NOTE — Progress Notes (Signed)
Appointment Outcome:  °Completed, Session #: 1 ° °AGREEMENTS SECTION ° °Overall Goal(s): °Eat a heart healthy diet °Reduce daily stress °Lose at least 20-25 pounds (2 pounds/wk) ° ° °Agreement/Action Steps:  °Wind down in evening by writing 3 things in gratitude journal °Start meal planning °Meal prep on Sunday for two days  °Eat a light breakfast °Maintain positive self-talk/affirmations & visualize success °Walk 10-15 minutes around neighborhood daily ° °Progress Notes:  °Patient stated that the first two weeks after meeting with me, implementing the steps were not hard because she was in a good head space. Patient stated that she prior to and after her trip to [place], she fell off. Patient stated that prior to the trip she was walking every day. She had cut back on junk food/soda.  ° °Patient feels that her barrier is her mental space, and this leads to her comfort eating. Patient typically eats a cheeseburger or chili cheese fries when she is stressed. She also enjoys baked potatoes, caramelized sweet potatoes, but she is not really a sweets person. ° °She states that she has not really been trying and have lost motivation. She mentioned that she has been beating herself up because she was not able to keep up with her meetings and staying on track with her goals.  ° °Patient stated that she has been able to maintain her journaling but the timing when she does it varies. Patient has been able to share her journal (feelings/thoughts) with her husband regarding some of the things that have caused her some mental/emotional stress. This allowed this time to connect and talk.  ° °Patient stated that she took her a mental weekend. During this time, she did house chores. She felt focused because she was able to accomplish a task, which made her feel better.  ° °Patient has been working with Kristen Thomas on exercise routine. Patient informed me that the plan is to walk 30 minutes a day (15 minutes at a time) and  exercises like jumping jacks. Patient stated that she has a [employer] health app and another app that she uses as a backup to her watch to monitor her heart rate, distance, etc. ° °• Indicators of Success and Accountability:  To be able to engage in action steps regardless of mental state. °• Readiness: Patient is in action phase of managing stress, eating healthy, and incorporating exercise. °• Strengths and Supports: Patient has her husband as a major support. Patient has been engaged in healthy eating and physical activity habits prior to health coaching with husband and can draw on those experiences.  °• Challenges and Barriers: Patient stated that it will be tough making time for herself because she is so used to ding for everybody else. ° ° °Coaching Outcomes: °Patient has a friend that conducts Zumba virtually on [employer]. Patient is interested in doing exercises in her home with the change in the weather and time. Care Guide will assist with creative exercises that the patient can do in home. Patient will continue to use apps to track physical activity data while she walks 30-minutes, M-F. ° °Patient stated that it would be best for her to work out when everyone goes to bed. Typically, the children are sleep by 9:30pm and she normally goes to sleep by 11-11:30pm. Patient will utilize this time to exercise and to wind down by writing in her journal. Patient will use this as a communication strategy between her and her husband.  ° °Patient will start meal   prepping on Sundays for two days. Patient will replace some of her unhealthy comfort eating with seedless green grapes, strawberries, peaches, bananas, or watermelon. Patient is interested in being able to cook southern, Italian, and seafood in a healthy way. Patient will restart eating a light breakfast each morning.  ° °Patient will continue to check-in with herself to ask how she is feeling and what does she need in the moment. Patient will also continue to  practice positive self-talk/affirmations.  ° °Patient's biggest takeaway is the need to make time for herself and that these sessions are critical to helping her stay focused. ° ° °Attempted: °• Fulfilled #- Patient has been able to maintain journaling as a stress reliever.  °• Partial #- Patient was able to make changes during the first two weeks with all of her action steps until she approached the trip to [place].  ° ° ° ° ° ° °

## 2020-05-20 NOTE — Telephone Encounter (Signed)
Appointment Outcome:  Completed, Session #: 1  AGREEMENTS SECTION  Overall Goal(s): Eat a heart healthy diet Reduce daily stress Lose at least 20-25 pounds (2 pounds/wk)   Agreement/Action Steps:  Wind down in evening by writing 3 things in gratitude journal Start meal planning Meal prep on Sunday for two days  Eat a light breakfast Maintain positive self-talk/affirmations & visualize success Walk 10-15 minutes around neighborhood daily  Progress Notes:  Patient stated that the first two weeks after meeting with me, implementing the steps were not hard because she was in a good head space. Patient stated that she prior to and after her trip to [place], she fell off. Patient stated that prior to the trip she was walking every day. She had cut back on junk food/soda.   Patient feels that her barrier is her mental space, and this leads to her comfort eating. Patient typically eats a cheeseburger or chili cheese fries when she is stressed. She also enjoys baked potatoes, caramelized sweet potatoes, but she is not really a sweets person.  She states that she has not really been trying and have lost motivation. She mentioned that she has been beating herself up because she was not able to keep up with her meetings and staying on track with her goals.   Patient stated that she has been able to maintain her journaling but the timing when she does it varies. Patient has been able to share her journal (feelings/thoughts) with her husband regarding some of the things that have caused her some mental/emotional stress. This allowed this time to connect and talk.   Patient stated that she took her a mental weekend. During this time, she did house chores. She felt focused because she was able to accomplish a task, which made her feel better.   Patient has been working with Meredith Velez on exercise routine. Patient informed me that the plan is to walk 30 minutes a day (15 minutes at a time) and  exercises like jumping jacks. Patient stated that she has a Neurosurgeon and another app that she uses as a backup to her watch to monitor her heart rate, distance, etc.   Indicators of Success and Accountability:  To be able to engage in action steps regardless of mental state.  Readiness: Patient is in action phase of managing stress, eating healthy, and incorporating exercise.  Strengths and Supports: Patient has her husband as a major support. Patient has been engaged in healthy eating and physical activity habits prior to health coaching with husband and can draw on those experiences.   Challenges and Barriers: Patient stated that it will be tough making time for herself because she is so used to ding for everybody else.   Coaching Outcomes: Patient has a friend that conducts Zumba virtually on [employer]. Patient is interested in doing exercises in her home with the change in the weather and time. Care Guide will assist with creative exercises that the patient can do in home. Patient will continue to use apps to track physical activity data while she walks 30-minutes, M-F.  Patient stated that it would be best for her to work out when everyone goes to bed. Typically, the children are sleep by 9:30pm and she normally goes to sleep by 11-11:30pm. Patient will utilize this time to exercise and to wind down by writing in her journal. Patient will use this as a communication strategy between her and her husband.   Patient will start meal  prepping on Sundays for two days. Patient will replace some of her unhealthy comfort eating with seedless green grapes, strawberries, peaches, bananas, or watermelon. Patient is interested in being able to cook [place], and seafood in a healthy way. Patient will restart eating a light breakfast each morning.   Patient will continue to check-in with herself to ask how she is feeling and what does she need in the moment. Patient will also continue to  practice positive self-talk/affirmations.   Patient's biggest takeaway is the need to make time for herself and that these sessions are critical to helping her stay focused.   Attempted:  Fulfilled #- Patient has been able to maintain journaling as a stress reliever.   Partial #- Patient was able to make changes during the first two weeks with all of her action steps until she approached the trip to [place].

## 2020-06-03 ENCOUNTER — Ambulatory Visit: Payer: Self-pay

## 2020-06-10 ENCOUNTER — Other Ambulatory Visit: Payer: Self-pay

## 2020-06-10 ENCOUNTER — Ambulatory Visit (INDEPENDENT_AMBULATORY_CARE_PROVIDER_SITE_OTHER): Payer: 59

## 2020-06-10 DIAGNOSIS — Z0000 Encounter for general adult medical examination without abnormal findings: Secondary | ICD-10-CM

## 2020-06-10 NOTE — Patient Instructions (Signed)

## 2020-06-10 NOTE — Progress Notes (Signed)
Appointment Outcome:  Completed, Session #: 2  AGREEMENTS SECTION   Overall Goal(s): Improve stress management  Eat a heart healthy diet Lose at least 20-25 pounds  Agreement/Action Steps:  Walk on both 15 minutes breaks at work for 10 minutes.  Spend the remaining 5 minutes reflecting on one or two things positive that you can write down in your journal during these breaks.  Incorporate positive self-talk/affirmations Self-check-ins: "How does Meredith Velez feel right now?" and "What does [name] need in this moment of stress?" Listen to [employer] and [employer] prepping again on Wed for Thurs-Friday. Start back eating a light breakfast (fruit, boiled egg, toast) Limit sodium intake to 2000 mg per day Drink no more than 64 oz. of fluid per day (includes fluid in food and beverages)  Weigh self every day at 6:00am  Progress Notes:  Patient has not been able to really implement action steps because of grieving the loss of her brother and husband's grandmother right before Thanksgiving. Patient reported that she was journaling and it helps but haven't been able to because of time and everything surrounding the family.  Patient stated that she has been dealing with everything by focusing on the children to keep her mind off things. Patient reported that she has not been resting well as a result and have just recently gone back to work after getting back in town. Patient stated that she managed to cry once today compared to others.   Patient stated that she has been listening to [place] but have not had any personal time to engage in physical activity.  . Indicators of Success and Accountability:  Being able to get back into the routine of implementing action steps.   . Readiness: Patient is in the action phase of stress management and the preparation stage of implementing a heart healthy diet and losing weight.  . Strengths and Supports: Patient has  her husband and other family members that are supporting her. Patient was able to find a strategy to aid in stress management even during bereavement.  . Challenges and Barriers: Grief  Coaching Outcomes: Patient reflected with health coach on positive things that she has been able to accomplish over the past two weeks. Patient recognizes that she had the strength to continue pushing in spite of the circumstances and finally was able to get herself to the point to go back to work, to get her daughter ready for school, and take care of other responsibilities she has. Patient perspective now is that "you got this," "it'll get better,", and "this won't last forever." Patient recognizes the benefits of positive self-talk.   Patient is setting aside personal time to listen to [place], meditate and pray as a means to reduce stress. Patient will conduct self-check-ins, in addition to incorporating positive self-talk and daily affirmations. Patient will begin to take her (2) 15-minute work breaks to walk for 10 minutes and reflect in her journal for 5 minutes.   Patient will start weighing herself each morning at 6am to determine if there has been a 3-pound gain overnight or a 5-pound gain in a few days to monitor fluid levels.   Patient will start meal prepping Wednesday for the following two days and return to the regular schedule of meal prepping on Sundays. Patient will start incorporating a light breakfast (fruit, boiled egg, toast) again into her daily meals.   Patient will be utilizing her support system outside of her husband.  Patient  is interested in learning more about low-sodium, heart healthy diet.   Patient's takeaway from today's session was allowing herself to grieve and that it is important to her to regain focus of her goals.    Attempted: . Partial - Patient was able to journal sometimes during the past two weeks. Patient utilized some positive self-talk/affirmations. . Not met -  Patient was not able to meal prep or walk for 10-15 minutes day around neighborhood daily. Patient has not been able to eat breakfast on a regular basis because of time and lack of appetite.   Note: Patient will fax a signed copy of the health coaching agreement to the Care Guide asap. Care Guide will provide resources on a low-sodium diet.

## 2020-06-10 NOTE — Progress Notes (Signed)
Cardiology Office Note   Date:  06/14/2020   ID:  Meredith Velez, DOB 04-Mar-1986, MRN [id number]  PCP:  Meredith Saint, MD  Cardiologist:  Dr. Anne Fu, MD   Chief Complaint  Patient presents with  . Follow-up    History of Present Illness: Meredith Velez is a 34 y.o. female who presents for follow up, seen for Dr. Anne Velez.   [name] has a hx of preeclampsia peripartum cardiomyopathy, hypertension, anxiety, depression,and migraines seen by the cardiology service for postpartum cardiomyopathy during hospitalization in 2020.  Initial echocardiogram 02/14/2019 showed EF of 20 to 25% at which time she was placed on Entresto and Toprol, moderate dose.Repeat echocardiogram 04/07/2019 showed EF of 40 to 45% improved from prior. Patient understood that pregnancy while on Entresto could cause birth defects therefore she actually ended up having a tubal ligation with her last pregnancy.  She was last seen by Dr. Anne Velez 03/26/20 and reported feeling very overwhelmed with her current cardiac situation. She was struggling to take her medications and was feeling very depressed about her situation. She was continued on Entresto 49/51 dosing and worried that hypertension was playing a role in her cardiomyopathy. Plan was to have Meredith Velez follow with her. She is now participating in health coaching on a regular basis.   Today she states that since being set up with CSW, she has been receiving counseling including nutrition and exercise along with life coaching which has completely changed her outlook on her situation. She reports that she continues to have her good and bad days however she now has the resources to self help or she can also reach out to her team. Overall she is well at this time. She BP is very well controlled. No chest pain, SOB or LE edema. I am happy to see her happy.   Past Medical History:  Diagnosis Date  . Anemia   . Anxiety   . Depression    pp depression   . Frequent headaches   . GERD (gastroesophageal reflux disease)   . Hypertension   . Migraines   . 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 Seasonal allergies   . Ulcer, stomach peptic   . UTI (lower urinary tract infection)   . Wears glasses     Past Surgical History:  Procedure Laterality Date  . COLONOSCOPY    . LAPAROSCOPIC BILATERAL SALPINGECTOMY Bilateral 01/04/2019   Procedure: LAPAROSCOPIC BILATERAL SALPINGECTOMY;  Surgeon: Meredith Coho, MD;  Location: [facility];  Service: Gynecology;  Laterality: Bilateral;     Current Outpatient Medications  Medication Sig Dispense Refill  . acetaminophen (TYLENOL) 325 MG tablet Take 2 tablets (650 mg total) by mouth every 4 (four) hours as needed (for pain scale < 4). 60 tablet 0  . cetirizine-pseudoephedrine (ZYRTEC-D) 5-120 MG tablet Take 1 tablet by mouth daily. (Patient taking differently: Take 1 tablet by mouth as needed.) 30 tablet 0  . furosemide (LASIX) 20 MG tablet Take 20 mg by mouth every other day.    . ipratropium (ATROVENT) 0.03 % nasal spray as needed.    . metoprolol succinate (TOPROL-XL) 100 MG 24 hr tablet TAKE 1 TABLET BY MOUTH DAILY WITH OR IMMEDIATELY FOLLOWING A MEAL 90 tablet 0  . sacubitril-valsartan (ENTRESTO) 49-51 MG Take 1 tablet by mouth 2 (two) times daily. 60 tablet 6  . spironolactone (ALDACTONE) 25 MG tablet TAKE 1/2 TABLET BY MOUTH DAILY 45 tablet 1  . valACYclovir (VALTREX) 1000 MG tablet Take 1  tablet by mouth as needed.     No current facility-administered medications for this visit.    Allergies:   Patient has no known allergies.    Social History:  The patient  reports that she has never smoked. She has never used smokeless tobacco. She reports current alcohol use. She reports that she does not use drugs.   Family History:  The patient's family history includes Alzheimer's disease in her paternal grandmother; Breast cancer in her maternal aunt; Diabetes in her father and mother;  Heart disease in her mother; Hyperlipidemia in her mother; Hypertension in her brother, father, and mother; Kidney disease in her mother; Lung cancer in her maternal uncle; Prostate cancer in her maternal grandfather and maternal grandmother; Stroke in her father.    ROS:  Please see the history of present illness.   Otherwise, review of systems are positive for none.   All other systems are reviewed and negative.    PHYSICAL EXAM: VS:  BP 112/76   Pulse 84   Ht 5\' 2"  (1.575 m)   Wt 175 lb 3.2 oz (79.5 kg)   SpO2 96%   BMI 32.04 kg/m  , BMI Body mass index is 32.04 kg/m.  General: Well developed, well nourished, NAD Neck: Negative for carotid bruits. No JVD Lungs:Clear to ausculation bilaterally. No wheezes, rales, or rhonchi. Breathing is unlabored. Cardiovascular: RRR with S1 S2. No murmurs Extremities: No edema. Neuro: Alert and oriented. No focal deficits. No facial asymmetry. MAE spontaneously. Psych: Responds to questions appropriately with normal affect.    EKG:  EKG is not ordered today.  Recent Labs: No results found for requested labs within last 8760 hours.   Lipid Panel    Component Value Date/Time   CHOL 126 05/11/2014 0937   TRIG 29.0 05/11/2014 0937   HDL 43.10 05/11/2014 0937   CHOLHDL 3 05/11/2014 0937   VLDL 5.8 05/11/2014 0937   LDLCALC 77 05/11/2014 0937    Wt Readings from Last 3 Encounters:  06/14/20 175 lb 3.2 oz (79.5 kg)  03/26/20 178 lb (80.7 kg)  08/29/19 179 lb (81.2 kg)     Other studies Reviewed: Additional studies/ records that were reviewed today include:  Review of the above records demonstrates:  Echocardiogram 02/14/2019:  1. The left ventricle has severely reduced systolic function, with an  ejection fraction of 20-25%. The cavity size was severely dilated. Left  ventricular diastolic Doppler parameters are consistent with  pseudonormalization. Elevated left ventricular  end-diastolic pressure.  2. The right ventricle has  normal systolic function. The cavity was  normal. There is no increase in right ventricular wall thickness.  3. Mild thickening of the mitral valve leaflet. Mild calcification of the  mitral valve leaflet.  4. The aortic valve is tricuspid. Mild thickening of the aortic valve.  Mild calcification of the aortic valve.  5. The aorta is normal in size and structure.  6. The interatrial septum was not well visualized.  Echocardiogram 04/07/2019:  Left ventricular ejection fraction, by visual estimation, is 40 to 45%. The left ventricle has moderately decreased function. Mildly increased left ventricular size. There is no left ventricular hypertrophy. 2. Apical window is foreshortened some, making evaluation difficult Compared to echo images from Aug 2020, LVEF appears improved. 3. Global right ventricle has normal systolic function.The right ventricular size is normal. No increase in right ventricular wall thickness.  ASSESSMENT AND PLAN:  1.  Peripartum cardiomyopathy: -Initial LVEF noted to be 20% in 2020 with repeat echocardiogram 04/07/2019  with improvement at 40 to 45%. Previously on high dose Entresto however given her struggles with depression she self discontinued for a short time. She was then restarted at mid dose and is tolerating well. Given this, we will continue this dose for a while longer then at next follow up, plan to up titrate.  -Continue on spironolactone, Lasix and Toprol.   2. Essential hypertension: -Improved, 112/76 -Continue current regimen   3. Depression: -Now follows with CSW that includes counseling regarding nutrition, exercise and life coaching and she is thriving   Current medicines are reviewed at length with the patient today.  The patient does not have concerns regarding medicines.  The following changes have been made:  no change  Labs/ tests ordered today include: BMET  Orders Placed This Encounter  Procedures  . Basic metabolic panel     Disposition:   Velez with Dr. Anne Velez or myself in 6 months  Signed, Georgie Chard, NP  06/14/2020 2:20 PM    [facility] [address] [place], [place], [place]  [address] Phone: [phone]; Fax: [phone]

## 2020-06-14 ENCOUNTER — Ambulatory Visit: Payer: 59 | Admitting: Cardiology

## 2020-06-14 ENCOUNTER — Encounter: Payer: Self-pay | Admitting: Cardiology

## 2020-06-14 ENCOUNTER — Other Ambulatory Visit: Payer: Self-pay

## 2020-06-14 ENCOUNTER — Telehealth (HOSPITAL_COMMUNITY): Payer: Self-pay | Admitting: Licensed Clinical Social Worker

## 2020-06-14 VITALS — BP 112/76 | HR 84 | Ht 62.0 in | Wt 175.2 lb

## 2020-06-14 DIAGNOSIS — I10 Essential (primary) hypertension: Secondary | ICD-10-CM

## 2020-06-14 DIAGNOSIS — F32A Depression, unspecified: Secondary | ICD-10-CM | POA: Diagnosis not present

## 2020-06-14 DIAGNOSIS — O903 Peripartum cardiomyopathy: Secondary | ICD-10-CM

## 2020-06-14 DIAGNOSIS — Z79899 Other long term (current) drug therapy: Secondary | ICD-10-CM | POA: Diagnosis not present

## 2020-06-14 NOTE — Patient Instructions (Signed)
Medication Instructions:  Your physician recommends that you continue on your current medications as directed. Please refer to the Current Medication list given to you today.  *If you need a refill on your cardiac medications before your next appointment, please call your pharmacy*   Lab Work: BMET today If you have labs (blood work) drawn today and your tests are completely normal, you will receive your results only by: Marland Kitchen MyChart Message (if you have MyChart) OR . A paper copy in the mail If you have any lab test that is abnormal or we need to change your treatment, we will call you to review the results.   Testing/Procedures: none   Follow-Up: At [facility], you and your health needs are our priority.  As part of our continuing mission to provide you with exceptional heart care, we have created designated Provider Care Teams.  These Care Teams include your primary Cardiologist (physician) and Advanced Practice Providers (APPs -  Physician Assistants and Nurse Practitioners) who all work together to provide you with the care you need, when you need it.  We recommend signing up for the patient portal called "MyChart".  Sign up information is provided on this After Visit Summary.  MyChart is used to connect with patients for Virtual Visits (Telemedicine).  Patients are able to view lab/test results, encounter notes, upcoming appointments, etc.  Non-urgent messages can be sent to your provider as well.   To learn more about what you can do with MyChart, go to [email or web address].    Your next appointment:   6 month(s)  The format for your next appointment:   In Person  Provider:   You may see Donato Schultz, MD or one of the following Advanced Practice Providers on your designated Care Team:     Georgie Chard, NP    Other Instructions You will receive a letter in the mail reminding you to call and make your apt for June of 2022.

## 2020-06-14 NOTE — Telephone Encounter (Signed)
CSW contacted patient to offer support and follow up patient shared that she lost her older brother right before Thanksgiving. She spoke of the challenges although stares she is doing well.  CSW provided supportive intervention and available as needed. [name], LCSW, CCSW-MCS [phone]

## 2020-06-15 LAB — BASIC METABOLIC PANEL
BUN/Creatinine Ratio: 13 (ref 9–23)
BUN: 12 mg/dL (ref 6–20)
CO2: 25 mmol/L (ref 20–29)
Calcium: 9.2 mg/dL (ref 8.7–10.2)
Chloride: 103 mmol/L (ref 96–106)
Creatinine, Ser: 0.95 mg/dL (ref 0.57–1.00)
GFR calc Af Amer: 90 mL/min/{1.73_m2} (ref >59)
GFR calc non Af Amer: 78 mL/min/{1.73_m2} (ref >59)
Glucose: 99 mg/dL (ref 65–99)
Potassium: 4.1 mmol/L (ref 3.5–5.2)
Sodium: 139 mmol/L (ref 134–144)

## 2020-06-19 ENCOUNTER — Telehealth: Payer: Self-pay | Admitting: *Deleted

## 2020-06-19 NOTE — Telephone Encounter (Signed)
Contacted patient to re-initiate coaching as there were several missed encounters and break in communication. Patient stated she has been grieving the loss of her brother (just before thanksgiving) and has had a slight shift in her priorities. We briefly discussed her new focus in exercise coaching. She was encouraged to walk 3 days a week, 30 minutes each time. We will recap and reset goals on December 29th at 10:30am.    Lesia Hausen, MS, ACSM, [facility] Clinical Exercise Physiologist/ Health and Wellness Coach   Time spent telephonically with patient: 15 minutes

## 2020-06-24 ENCOUNTER — Ambulatory Visit: Payer: 59

## 2020-06-24 ENCOUNTER — Telehealth: Payer: Self-pay

## 2020-06-24 DIAGNOSIS — Z0000 Encounter for general adult medical examination without abnormal findings: Secondary | ICD-10-CM

## 2020-06-24 NOTE — Telephone Encounter (Signed)
Called patient as scheduled for health coaching session over the phone. Patient was unable to meet during this time and requested a reschedule. Patient has been rescheduled for 06/25/20 at 6:00pm.

## 2020-06-25 ENCOUNTER — Telehealth: Payer: Self-pay

## 2020-06-25 ENCOUNTER — Ambulatory Visit: Payer: 59

## 2020-06-25 DIAGNOSIS — Z0000 Encounter for general adult medical examination without abnormal findings: Secondary | ICD-10-CM

## 2020-06-25 NOTE — Telephone Encounter (Signed)
Called patient during scheduled appointment for health coaching. Left a message for patient to call Care Guide back by 7pm today at [phone] for session or to reschedule.

## 2020-07-10 ENCOUNTER — Telehealth: Payer: Self-pay | Admitting: *Deleted

## 2020-07-10 NOTE — Telephone Encounter (Signed)
Contacted patient to follow-up for Exercise and wellness coaching. She sounded very congested and said she was exposed as she was taking care of her mom who tested positive for COVID. She was tested today and is going to contact her primary care doctor for more guidance.   Will follow-up in a week with patient.     Lesia Hausen, MS, ACSM, [facility] Clinical Exercise Physiologist/ Health and Wellness Coach

## 2020-07-12 ENCOUNTER — Other Ambulatory Visit: Payer: Self-pay | Admitting: Cardiology

## 2020-07-12 ENCOUNTER — Other Ambulatory Visit: Payer: Self-pay | Admitting: Physician Assistant

## 2020-07-17 ENCOUNTER — Telehealth: Payer: Self-pay | Admitting: *Deleted

## 2020-07-17 NOTE — Telephone Encounter (Signed)
Called to check in on patient since she last stated last week that she has Covid and is having a tough time shaking it. She did sound better today and she also reports that she is improving but very slowly and that she only has a few hours of the day that she feels like working or handling house chores. We agreed I would follow up and check in with her in one week. Patient was advised to call her primary doctor is this lingers much longer and to monitor any worsening symptoms to seek medical help.    Lesia Hausen, MS, ACSM, [facility] Clinical Exercise Physiologist/ Health and Wellness Coach

## 2020-07-24 ENCOUNTER — Telehealth: Payer: Self-pay | Admitting: *Deleted

## 2020-07-24 NOTE — Telephone Encounter (Signed)
Contacted patient for follow-up for exercise and wellness coaching. Patient did not answer. Left message for patient's return call and to reschedule a coaching visit. Will continue attempting to reach patient.     Javin Nong, MS, ACSM-RCEP, [facility] Clinical Exercise Physiologist/ Health and Wellness Coach       

## 2020-08-21 ENCOUNTER — Encounter: Payer: 59 | Admitting: Family Medicine

## 2020-08-22 ENCOUNTER — Encounter: Payer: Self-pay | Admitting: Family Medicine

## 2020-08-22 ENCOUNTER — Ambulatory Visit (INDEPENDENT_AMBULATORY_CARE_PROVIDER_SITE_OTHER): Payer: 59 | Admitting: Family Medicine

## 2020-08-22 ENCOUNTER — Other Ambulatory Visit: Payer: Self-pay

## 2020-08-22 VITALS — BP 132/88 | HR 96 | Temp 98.0°F | Ht 62.0 in | Wt 168.0 lb

## 2020-08-22 DIAGNOSIS — I10 Essential (primary) hypertension: Secondary | ICD-10-CM

## 2020-08-22 DIAGNOSIS — Z0000 Encounter for general adult medical examination without abnormal findings: Secondary | ICD-10-CM | POA: Diagnosis not present

## 2020-08-22 DIAGNOSIS — R5383 Other fatigue: Secondary | ICD-10-CM | POA: Diagnosis not present

## 2020-08-22 DIAGNOSIS — R0683 Snoring: Secondary | ICD-10-CM

## 2020-08-22 DIAGNOSIS — L308 Other specified dermatitis: Secondary | ICD-10-CM

## 2020-08-22 MED ORDER — TRIAMCINOLONE ACETONIDE 0.025 % EX OINT: 1.0000 "application " | TOPICAL_OINTMENT | Freq: Two times a day (BID) | CUTANEOUS | 0 refills | Status: DC

## 2020-08-22 NOTE — Progress Notes (Signed)
Subjective:     Meredith Velez is a 35 y.o. female and is here for a comprehensive physical exam. The patient reports continued fatigue s/p COVID infection in January 2022. Endorses snoring, decreased appetite, and eczema.  Does not feel rested when wakes up.  Pt taking lasix, toprol xl, entresto, and spironolactone for HTN and postpartum cardiomyopathy.  Follwoed by OB/Gyn, has an appt in April.  Taking Ergocalciferol for vit D def.  Social History   Socioeconomic History  . Marital status: Married    Spouse name: Not on file  . Number of children: Not on file  . Years of education: Not on file  . Highest education level: Not on file  Occupational History  . Occupation: Wellsite geologist: [employer]  Tobacco Use  . Smoking status: Never Smoker  . Smokeless tobacco: Never Used  Vaping Use  . Vaping Use: Never used  Substance and Sexual Activity  . Alcohol use: Yes    Comment: occ  . Drug use: No  . Sexual activity: Yes    Birth control/protection: None  Other Topics Concern  . Not on file  Social History Narrative  . Not on file   Social Determinants of Health   Financial Resource Strain: Not on file  Food Insecurity: No Food Insecurity  . Worried About Programme researcher, broadcasting/film/video in the Last Year: Never true  . Ran Out of Food in the Last Year: Never true  Transportation Needs: No Transportation Needs  . Lack of Transportation (Medical): No  . Lack of Transportation (Non-Medical): No  Physical Activity: Not on file  Stress: Stress Concern Present  . Feeling of Stress : Very much  Social Connections: Not on file  Intimate Partner Violence: Not on file   Health Maintenance  Topic Date Due  . Hepatitis C Screening  Never done  . COVID-19 Vaccine (1) Never done  . PAP SMEAR-Modifier  08/25/2019  . TETANUS/TDAP  08/22/2021 (Originally 07/02/2003)  . INFLUENZA VACCINE  10/03/2021 (Originally 02/04/2020)  . HIV Screening  Completed    The following  portions of the patient's history were reviewed and updated as appropriate: allergies, current medications, past family history, past medical history, past social history, past surgical history and problem list.  Review of Systems Pertinent items noted in HPI and remainder of comprehensive ROS otherwise negative.   Objective:    BP 132/88 (BP Location: Left Arm, Patient Position: Sitting, Cuff Size: Normal)   Pulse 96   Temp 98 F (36.7 C) (Oral)   Ht 5\' 2"  (1.575 m)   Wt 168 lb (76.2 kg)   LMP 08/22/2020 (Exact Date)   SpO2 98%   BMI 30.73 kg/m  General appearance: alert, cooperative and no distress Head: Normocephalic, without obvious abnormality, atraumatic Eyes: conjunctivae/corneas clear. PERRL, EOM's intact. Fundi benign. Ears: normal TM's and external ear canals both ears Nose: Nares normal. Septum midline. Mucosa normal. No drainage or sinus tenderness. Throat: lips, mucosa, and tongue normal; teeth and gums normal Neck: no adenopathy, no carotid bruit, no JVD, supple, symmetrical, trachea midline and thyroid not enlarged, symmetric, no tenderness/mass/nodules Lungs: clear to auscultation bilaterally Heart: regular rate and rhythm, S1, S2 normal, no murmur, click, rub or gallop Abdomen: soft, non-tender; bowel sounds normal; no masses,  no organomegaly Extremities: extremities normal, atraumatic, no cyanosis or edema Pulses: 2+ and symmetric Skin: Skin color, texture, turgor normal. No rashes or lesions Lymph nodes: Cervical, supraclavicular, and axillary nodes normal. Neurologic: Alert and  oriented X 3, normal strength and tone. Normal symmetric reflexes. Normal coordination and gait    Assessment:    Healthy female exam.      Plan:     Anticipatory guidance given including wearing seatbelts, smoke detectors in the home, increasing physical activity, increasing p.o. intake of water and vegetables. -will wait on labs -influenza and tdap declined this visit. -given  handout -next CPE in 1 yr See After Visit Summary for Counseling Recommendations    Other fatigue  - Plan: Ambulatory referral to Sleep Studies  Snoring  - Plan: Ambulatory referral to Sleep Studies  Essential hypertension  -elevated  -continue current meds and lifestyle modifications - Plan: Ambulatory referral to Sleep Studies  Other eczema  -discussed supportive care - Plan: triamcinolone (KENALOG) 0.025 % ointment  F/u prn  Abbe Amsterdam, MD

## 2020-08-22 NOTE — Patient Instructions (Signed)
Preventive Care 21-37 Years Old, Female Preventive care refers to lifestyle choices and visits with your health care provider that can promote health and wellness. This includes:  A yearly physical exam. This is also called an annual wellness visit.  Regular dental and eye exams.  Immunizations.  Screening for certain conditions.  Healthy lifestyle choices, such as: ? Eating a healthy diet. ? Getting regular exercise. ? Not using drugs or products that contain nicotine and tobacco. ? Limiting alcohol use. What can I expect for my preventive care visit? Physical exam Your health care provider may check your:  Height and weight. These may be used to calculate your BMI (body mass index). BMI is a measurement that tells if you are at a healthy weight.  Heart rate and blood pressure.  Body temperature.  Skin for abnormal spots. Counseling Your health care provider may ask you questions about your:  Past medical problems.  Family's medical history.  Alcohol, tobacco, and drug use.  Emotional well-being.  Home life and relationship well-being.  Sexual activity.  Diet, exercise, and sleep habits.  Work and work environment.  Access to firearms.  Method of birth control.  Menstrual cycle.  Pregnancy history. What immunizations do I need? Vaccines are usually given at various ages, according to a schedule. Your health care provider will recommend vaccines for you based on your age, medical history, and lifestyle or other factors, such as travel or where you work.   What tests do I need? Blood tests  Lipid and cholesterol levels. These may be checked every 5 years starting at age 20.  Hepatitis C test.  Hepatitis B test. Screening  Diabetes screening. This is done by checking your blood sugar (glucose) after you have not eaten for a while (fasting).  STD (sexually transmitted disease) testing, if you are at risk.  BRCA-related cancer screening. This may be  done if you have a family history of breast, ovarian, tubal, or peritoneal cancers.  Pelvic exam and Pap test. This may be done every 3 years starting at age 21. Starting at age 30, this may be done every 5 years if you have a Pap test in combination with an HPV test. Talk with your health care provider about your test results, treatment options, and if necessary, the need for more tests.   Follow these instructions at home: Eating and drinking  Eat a healthy diet that includes fresh fruits and vegetables, whole grains, lean protein, and low-fat dairy products.  Take vitamin and mineral supplements as recommended by your health care provider.  Do not drink alcohol if: ? Your health care provider tells you not to drink. ? You are pregnant, may be pregnant, or are planning to become pregnant.  If you drink alcohol: ? Limit how much you have to 0-1 drink a day. ? Be aware of how much alcohol is in your drink. In the [place] one drink equals one 12 oz bottle of beer (355 mL), one 5 oz glass of wine (148 mL), or one 1 oz glass of hard liquor (44 mL).   Lifestyle  Take daily care of your teeth and gums. Brush your teeth every morning and night with fluoride toothpaste. Floss one time each day.  Stay active. Exercise for at least 30 minutes 5 or more days each week.  Do not use any products that contain nicotine or tobacco, such as cigarettes, e-cigarettes, and chewing tobacco. If you need help quitting, ask your health care provider.  Do not   use drugs.  If you are sexually active, practice safe sex. Use a condom or other form of protection to prevent STIs (sexually transmitted infections).  If you do not wish to become pregnant, use a form of birth control. If you plan to become pregnant, see your health care provider for a prepregnancy visit.  Find healthy ways to cope with stress, such as: ? Meditation, yoga, or listening to music. ? Journaling. ? Talking to a trusted  person. ? Spending time with friends and family. Safety  Always wear your seat belt while driving or riding in a vehicle.  Do not drive: ? If you have been drinking alcohol. Do not ride with someone who has been drinking. ? When you are tired or distracted. ? While texting.  Wear a helmet and other protective equipment during sports activities.  If you have firearms in your house, make sure you follow all gun safety procedures.  Seek help if you have been physically or sexually abused. What's next?  Go to your health care provider once a year for an annual wellness visit.  Ask your health care provider how often you should have your eyes and teeth checked.  Stay up to date on all vaccines. This information is not intended to replace advice given to you by your health care provider. Make sure you discuss any questions you have with your health care provider. Document Revised: 02/18/2020 Document Reviewed: 03/03/2018 Elsevier Patient Education  2021 [place].  Fatigue If you have fatigue, you feel tired all the time and have a lack of energy or a lack of motivation. Fatigue may make it difficult to start or complete tasks because of exhaustion. In general, occasional or mild fatigue is often a normal response to activity or life. However, long-lasting (chronic) or extreme fatigue may be a symptom of a medical condition. Follow these instructions at home: General instructions  Watch your fatigue for any changes.  Go to bed and get up at the same time every day.  Avoid fatigue by pacing yourself during the day and getting enough sleep at night.  Maintain a healthy weight. Medicines  Take over-the-counter and prescription medicines only as told by your health care provider.  Take a multivitamin, if told by your health care provider.  Do not use herbal or dietary supplements unless they are approved by your health care provider. Activity  Exercise regularly, as told by  your health care provider.  Use or practice techniques to help you relax, such as yoga, tai chi, meditation, or massage therapy.   Eating and drinking  Avoid heavy meals in the evening.  Eat a well-balanced diet, which includes lean proteins, whole grains, plenty of fruits and vegetables, and low-fat dairy products.  Avoid consuming too much caffeine.  Avoid the use of alcohol.  Drink enough fluid to keep your urine pale yellow.   Lifestyle  Change situations that cause you stress. Try to keep your work and personal schedule in balance.  Do not use any products that contain nicotine or tobacco, such as cigarettes and e-cigarettes. If you need help quitting, ask your health care provider.  Do not use drugs. Contact a health care provider if:  Your fatigue does not get better.  You have a fever.  You suddenly lose or gain weight.  You have headaches.  You have trouble falling asleep or sleeping through the night.  You feel angry, guilty, anxious, or sad.  You are unable to have a bowel movement (  constipation).  Your skin is dry.  You have swelling in your legs or another part of your body. Get help right away if:  You feel confused.  Your vision is blurry.  You feel faint or you pass out.  You have a severe headache.  You have severe pain in your abdomen, your back, or the area between your waist and hips (pelvis).  You have chest pain, shortness of breath, or an irregular or fast heartbeat.  You are unable to urinate, or you urinate less than normal.  You have abnormal bleeding, such as bleeding from the rectum, vagina, nose, lungs, or nipples.  You vomit blood.  You have thoughts about hurting yourself or others. If you ever feel like you may hurt yourself or others, or have thoughts about taking your own life, get help right away. You can go to your nearest emergency department or call:  Your local emergency services (911 in the [place]  A suicide  crisis helpline, such as the [facility] at (954)206-1733. This is open 24 hours a day. Summary  If you have fatigue, you feel tired all the time and have a lack of energy or a lack of motivation.  Fatigue may make it difficult to start or complete tasks because of exhaustion.  Long-lasting (chronic) or extreme fatigue may be a symptom of a medical condition.  Exercise regularly, as told by your health care provider.  Change situations that cause you stress. Try to keep your work and personal schedule in balance. This information is not intended to replace advice given to you by your health care provider. Make sure you discuss any questions you have with your health care provider. Document Revised: 01/11/2019 Document Reviewed: 03/17/2017 Elsevier Patient Education  2021 [place].  Eczema Eczema refers to a group of skin conditions that cause skin to become rough and inflamed. Each type of eczema has different triggers, symptoms, and treatments. Eczema of any type is usually itchy. Symptoms range from mild to severe. Eczema is not spread from person to person (is not contagious). It can appear on different parts of the body at different times. One person's eczema may look different from another person's eczema. What are the causes? The exact cause of this condition is not known. However, exposure to certain environmental factors, irritants, and allergens can make the condition worse. What are the signs or symptoms? Symptoms of this condition depend on the type of eczema you have. The types include:  Contact dermatitis. There are two kinds: ? Irritant contact dermatitis. This happens when something irritates the skin and causes a rash. ? Allergic contact dermatitis. This happens when your skin comes in contact with something you are allergic to (allergens). This can include poison ivy, chemicals, or medicines that were applied to your skin.  Atopic  dermatitis. This is a long-term (chronic) skin disease that keeps coming back (recurring). It is the most common type of eczema. Usual symptoms are a red rash and itchy, dry, scaly skin. It usually starts showing signs in infancy and can last through adulthood.  Dyshidrotic eczema. This is a form of eczema on the hands and feet. It shows up as very itchy, fluid-filled blisters. It can affect people of any age but is more common before age 52.  Hand eczema. This causes very itchy areas of skin on the palms and sides of the hands and fingers. This type of eczema is common in industrial jobs where you may be exposed to  different types of irritants.  Lichen simplex chronicus. This type of eczema occurs when a person constantly scratches one area of the body. Repeated scratching of the area leads to thickened skin (lichenification). This condition can accompany other types of eczema. It is more common in adults but may also be seen in children.  Nummular eczema. This is a common type of eczema that most often affects the lower legs and the backs of the hands. It typically causes an itchy, red, circular, crusty lesion (plaque). Scratching may become a habit and can cause bleeding. Nummular eczema occurs most often in middle-aged or older people.  Seborrheic dermatitis. This is a common skin disease that mainly affects the scalp. It may also affect other oily areas of the body, such as the face, sides of the nose, eyebrows, ears, eyelids, and chest. It is marked by small scaling and redness of the skin (erythema). This can affect people of all ages. In infants, this condition is called cradle cap.  Stasis dermatitis. This is a common skin disease that can cause itching, scaling, and hyperpigmentation, usually on the legs and feet. It occurs most often in people who have a condition that prevents blood from being pumped through the veins in the legs (chronic venous insufficiency). Stasis dermatitis is a chronic  condition that needs long-term management.   How is this diagnosed? This condition may be diagnosed based on:  A physical exam of your skin.  Your medical history.  Skin patch tests. These tests involve using patches that contain possible allergens and placing them on your back. Your health care provider will check in a few days to see if an allergic reaction occurred. How is this treated? Treatment for eczema is based on the type of eczema you have. You may be given hydrocortisone steroid medicine or antihistamines. These can relieve itching quickly and help reduce inflammation. These may be prescribed or purchased over the counter, depending on the strength that is needed. Follow these instructions at home:  Take or apply over-the-counter and prescription medicines only as told by your health care provider.  Use creams or ointments to moisturize your skin. Do not use lotions.  Learn what triggers or irritates your symptoms so you can avoid these things.  Treat symptom flare-ups quickly.  Do not scratch your skin. This can make your rash worse.  Keep all follow-up visits. This is important. Where to find more information  American Academy of Dermatology: MarketingSheets.si  National Eczema Association: nationaleczema.org  The Society for Pediatric Dermatology: pedsderm.net Contact a health care provider if:  You have severe itching, even with treatment.  You scratch your skin regularly until it bleeds.  Your rash looks different than usual.  Your skin is painful, swollen, or more red than usual.  You have a fever. Summary  Eczema refers to a group of skin conditions that cause skin to become rough and inflamed. Each type has different triggers.  Eczema of any type causes itching that may range from mild to severe.  Treatment varies based on the type of eczema you have. Hydrocortisone steroid medicine or antihistamines can help with itching and inflammation.  Protecting your  skin is the best way to prevent eczema. Use creams or ointments to moisturize your skin. Avoid triggers and irritants. Treat flare-ups quickly. This information is not intended to replace advice given to you by your health care provider. Make sure you discuss any questions you have with your health care provider. Document Revised: 04/01/2020 Document Reviewed:  04/01/2020 Elsevier Patient Education  2021 [place].  Sleep Study, Adult A sleep study (polysomnogram) is a series of tests done while you are sleeping. A sleep study records your brain waves, heart rate, breathing rate, oxygen level, and eye and leg movements. A sleep study helps your health care provider:  See how well you sleep.  Diagnose a sleep disorder.  Determine how severe your sleep disorder is.  Create a plan to treat your sleep disorder. Your health care provider may recommend a sleep study if you:  Feel sleepy on most days.  Snore loudly while sleeping.  Have unusual behaviors while you sleep, such as walking.  Have brief periods in which you stop breathing during sleep (sleepapnea).  Fall asleep suddenly during the day (narcolepsy).  Have trouble falling asleep or staying asleep (insomnia).  Feel like you need to move your legs when trying to fall asleep (restless legs syndrome).  Move your legs by flexing and extending them regularly while asleep (periodic limb movement disorder).  Act out your dreams while you sleep (sleep behavior disorder).  Feel like you cannot move when you first wake up (sleep paralysis). What tests are part of a sleep study? Most sleep studies record the following during sleep:  Brain activity.  Eye movements.  Heart rate and rhythm.  Breathing rate and rhythm.  Blood-oxygen level.  Blood pressure.  Chest and belly movement as you breathe.  Arm and leg movements.  Snoring or other noises.  Body position. Where are sleep studies done? Sleep studies are done  at sleep centers. A sleep center may be inside a hospital, office, or clinic. The room where you have the study may look like a hospital room or a hotel room. The health care providers doing the study may come in and out of the room during the study. Most of the time, they will be in another room monitoring your test as you sleep. How are sleep studies done? Most sleep studies are done during a normal period of time for a full night of sleep. You will arrive at the study center in the evening and go home in the morning. Before the test  Bring your pajamas and toothbrush with you to the sleep study.  Do not have caffeine on the day of your sleep study.  Do not drink alcohol on the day of your sleep study.  Your health care provider will let you know if you should stop taking any of your regular medicines before the test. During the test  Round, sticky patches with sensors attached to recording wires (electrodes) are placed on your scalp, face, chest, and limbs.  Wires from all the electrodes and sensors run from your bed to a computer. The wires can be taken off and put back on if you need to get out of bed to go to the bathroom.  A sensor is placed over your nose to measure airflow.  A finger clip is put on your finger or ear to measure your blood oxygen level (pulse oximetry).  A belt is placed around your belly and a belt is placed around your chest to measure breathing movements.  If you have signs of the sleep disorder called sleep apnea during your test, you may get a treatment mask to wear for the second half of the night. ? The mask provides positive airway pressure (PAP) to help you breathe better during sleep. This may greatly improve your sleep apnea. ? You will then have all  tests done again with the mask in place to see if your measurements and recordings change.      After the test  A medical doctor who specializes in sleep will evaluate the results of your sleep study  and share them with you and your primary health care provider.  Based on your results, your medical history, and a physical exam, you may be diagnosed with a sleep disorder, such as: ? Sleep apnea. ? Restless legs syndrome. ? Sleep-related behavior disorder. ? Sleep-related movement disorders. ? Sleep-related seizure disorders.  Your health care team will help determine your treatment options based on your diagnosis. This may include: ? Improving your sleep habits (sleep hygiene). ? Wearing a continuous positive airway pressure (CPAP) or bi-level positive airway pressure (BPAP) mask. ? Wearing an oral device at night to improve breathing and reduce snoring. ? Taking medicines. Follow these instructions at home:  Take over-the-counter and prescription medicines only as told by your health care provider.  If you are instructed to use a CPAP or BPAP mask, make sure you use it nightly as directed.  Make any lifestyle changes that your health care provider recommends.  If you were given a device to open your airway while you sleep, use it only as told by your health care provider.  Do not use any tobacco products, such as cigarettes, chewing tobacco, and e-cigarettes. If you need help quitting, ask your health care provider.  Keep all follow-up visits as told by your health care provider. This is important. Summary  A sleep study (polysomnogram) is a series of tests done while you are sleeping. It shows how well you sleep.  Most sleep studies are done over one full night of sleep. You will arrive at the study center in the evening and go home in the morning.  If you have signs of the sleep disorder called sleep apnea during your test, you may get a treatment mask to wear for the second half of the night.  A medical doctor who specializes in sleep will evaluate the results of your sleep study and share them with your primary health care provider. This information is not intended to  replace advice given to you by your health care provider. Make sure you discuss any questions you have with your health care provider. Document Revised: 07/28/2019 Document Reviewed: 07/20/2017 Elsevier Patient Education  2021 [employer].

## 2020-09-07 ENCOUNTER — Encounter: Payer: Self-pay | Admitting: Family Medicine

## 2020-09-25 ENCOUNTER — Telehealth (HOSPITAL_COMMUNITY): Payer: Self-pay | Admitting: Licensed Clinical Social Worker

## 2020-09-25 NOTE — Telephone Encounter (Signed)
CSW called pt to discuss new womens' "[facility]" support group.  Unable to reach patient- left VM requesting return call to discuss and gauge interest in being added to the mailing/reminder list for support group. ° °CSW will continue to follow and assist as needed ° °Jenna H. Uris, LCSW °Clinical Social Worker °[facility] °Desk#: [phone] °Cell#: [phone] ° ° °

## 2020-11-04 ENCOUNTER — Telehealth: Payer: Self-pay | Admitting: Cardiology

## 2020-11-04 ENCOUNTER — Telehealth: Payer: Self-pay

## 2020-11-04 DIAGNOSIS — Z0000 Encounter for general adult medical examination without abnormal findings: Secondary | ICD-10-CM

## 2020-11-04 MED ORDER — METOPROLOL SUCCINATE ER 100 MG PO TB24: 100.0000 mg | ORAL_TABLET | Freq: Every day | ORAL | 0 refills | Status: DC

## 2020-11-04 MED ORDER — FUROSEMIDE 20 MG PO TABS: 20.0000 mg | ORAL_TABLET | Freq: Every day | ORAL | 0 refills | Status: DC

## 2020-11-04 MED ORDER — SACUBITRIL-VALSARTAN 49-51 MG PO TABS: 1.0000 | ORAL_TABLET | Freq: Two times a day (BID) | ORAL | 0 refills | Status: DC

## 2020-11-04 MED ORDER — SPIRONOLACTONE 25 MG PO TABS: 0.5000 | ORAL_TABLET | Freq: Every day | ORAL | 0 refills | Status: DC

## 2020-11-04 NOTE — Telephone Encounter (Signed)
Sent in 90 day refill to [facility], per pt request.  Pt as an appointment 01/08/2021

## 2020-11-04 NOTE — Telephone Encounter (Signed)
Returned patient's call regarding her inquiry about starting health coaching again. Left patient a message that we can start a new health coaching agreement and to call Care Guide at [phone] to set up appointment for initial health coaching session to set goals.

## 2020-11-04 NOTE — Telephone Encounter (Signed)
*  STAT* If patient is at the pharmacy, call can be transferred to refill team.   1. Which medications need to be refilled? (please list name of each medication and dose if known) new prescriptions for Entresto, Furosemide, Spironolactone and Metoprolol  2. Which pharmacy/location (including street and city if local pharmacy) is medication to be sent to? [facility] [employer], [place]  3. Do they need a 30 day or 90 day supply? 30 days and refills

## 2020-11-05 ENCOUNTER — Other Ambulatory Visit: Payer: Self-pay

## 2020-11-05 ENCOUNTER — Encounter: Payer: Self-pay | Admitting: *Deleted

## 2020-11-05 ENCOUNTER — Other Ambulatory Visit: Payer: Self-pay | Admitting: Cardiology

## 2020-11-05 MED ORDER — SACUBITRIL-VALSARTAN 49-51 MG PO TABS: 1.0000 | ORAL_TABLET | Freq: Two times a day (BID) | ORAL | 0 refills | Status: DC

## 2020-11-05 NOTE — Progress Notes (Signed)
   Patient called to re-initiate coaching and Exercise home programming. See below for plan.    Lesia Hausen, MS, ACSM, [facility] Clinical Exercise Physiologist/ Health and Wellness Coach

## 2020-11-06 ENCOUNTER — Telehealth (HOSPITAL_COMMUNITY): Payer: Self-pay | Admitting: Licensed Clinical Social Worker

## 2020-11-06 NOTE — Telephone Encounter (Signed)
Pt attended and participated in [facility] Women's Support Group.   ? ?Meredith Velez H. Meredith Lakey, LCSW ?Clinical Social Worker ?[facility] ?Desk#: [phone] ?Cell#: [phone] ? ?

## 2020-11-14 LAB — CBC AND DIFFERENTIAL
HCT: 42 (ref 36–46)
Hemoglobin: 13.6 (ref 12.0–16.0)
Platelets: 368 (ref 150–399)
WBC: 10.3

## 2020-11-14 LAB — BASIC METABOLIC PANEL
BUN: 14 (ref 4–21)
CO2: 24 — AB (ref 13–22)
Chloride: 102 (ref 99–108)
Creatinine: 1.1 (ref 0.5–1.1)
Glucose: 86
Potassium: 5.4 — AB (ref 3.4–5.3)
Sodium: 140 (ref 137–147)

## 2020-11-14 LAB — HEPATIC FUNCTION PANEL
ALT: 15 (ref 7–35)
AST: 13 (ref 13–35)
Alkaline Phosphatase: 96 (ref 25–125)
Bilirubin, Total: 0.3

## 2020-11-14 LAB — LIPID PANEL
Cholesterol: 141 (ref 0–200)
HDL: 42 (ref 35–70)
LDL Cholesterol: 84
Triglycerides: 74 (ref 40–160)

## 2020-11-14 LAB — COMPREHENSIVE METABOLIC PANEL
Albumin: 4.7 (ref 3.5–5.0)
Calcium: 9.6 (ref 8.7–10.7)
Globulin: 3.3

## 2020-11-14 LAB — HM HEPATITIS C SCREENING LAB: HM Hepatitis Screen: NEGATIVE

## 2020-11-14 LAB — HIV ANTIBODY (ROUTINE TESTING W REFLEX): HIV 1&2 Ab, 4th Generation: NONREACTIVE

## 2020-11-14 LAB — VITAMIN D 25 HYDROXY (VIT D DEFICIENCY, FRACTURES): Vit D, 25-Hydroxy: 34

## 2020-11-14 LAB — TSH: TSH: 1.66 (ref <=5.90)

## 2020-11-14 LAB — CBC: RBC: 4.62 (ref 3.87–5.11)

## 2020-11-15 LAB — HM PAP SMEAR

## 2020-11-18 ENCOUNTER — Telehealth (HOSPITAL_COMMUNITY): Payer: Self-pay | Admitting: *Deleted

## 2020-11-18 NOTE — Telephone Encounter (Signed)
Contacted patient for follow-up for exercise and wellness coaching. Patient did not answer. Left message for patient's return call if needed. Will continue attempting to reach patient in the following weeks.     Lesia Hausen, MS, ACSM-RCEP, [facility] Clinical Exercise Physiologist/ Health and Wellness Coach

## 2020-11-19 LAB — HEMOGLOBIN A1C: Hemoglobin A1C: 5.3

## 2020-11-21 ENCOUNTER — Telehealth: Payer: Self-pay

## 2020-11-21 DIAGNOSIS — Z0000 Encounter for general adult medical examination without abnormal findings: Secondary | ICD-10-CM

## 2020-11-21 NOTE — Telephone Encounter (Signed)
Called patient to determine if she wanted to schedule an appointment to resume health coaching. Patient is interested but will call back today after 12pm to schedule an appointment.

## 2020-12-05 ENCOUNTER — Institutional Professional Consult (permissible substitution): Payer: 59 | Admitting: Pulmonary Disease

## 2020-12-17 ENCOUNTER — Other Ambulatory Visit: Payer: Self-pay

## 2021-01-01 ENCOUNTER — Ambulatory Visit: Payer: 59 | Admitting: Internal Medicine

## 2021-01-01 ENCOUNTER — Encounter: Payer: Self-pay | Admitting: Internal Medicine

## 2021-01-01 ENCOUNTER — Other Ambulatory Visit: Payer: Self-pay

## 2021-01-01 VITALS — BP 146/98 | HR 107 | Temp 98.7°F | Ht 62.0 in | Wt 176.2 lb

## 2021-01-01 DIAGNOSIS — O903 Peripartum cardiomyopathy: Secondary | ICD-10-CM

## 2021-01-01 DIAGNOSIS — Z1159 Encounter for screening for other viral diseases: Secondary | ICD-10-CM

## 2021-01-01 DIAGNOSIS — F419 Anxiety disorder, unspecified: Secondary | ICD-10-CM

## 2021-01-01 DIAGNOSIS — I5089 Other heart failure: Secondary | ICD-10-CM

## 2021-01-01 DIAGNOSIS — E6609 Other obesity due to excess calories: Secondary | ICD-10-CM

## 2021-01-01 DIAGNOSIS — I110 Hypertensive heart disease with heart failure: Secondary | ICD-10-CM

## 2021-01-01 DIAGNOSIS — Z6832 Body mass index (BMI) 32.0-32.9, adult: Secondary | ICD-10-CM

## 2021-01-01 DIAGNOSIS — H6121 Impacted cerumen, right ear: Secondary | ICD-10-CM | POA: Diagnosis not present

## 2021-01-01 DIAGNOSIS — M79609 Pain in unspecified limb: Secondary | ICD-10-CM

## 2021-01-01 DIAGNOSIS — N289 Disorder of kidney and ureter, unspecified: Secondary | ICD-10-CM | POA: Diagnosis not present

## 2021-01-01 DIAGNOSIS — R202 Paresthesia of skin: Secondary | ICD-10-CM

## 2021-01-01 LAB — POCT URINALYSIS DIPSTICK
Bilirubin, UA: NEGATIVE
Blood, UA: NEGATIVE
Glucose, UA: NEGATIVE
Ketones, UA: NEGATIVE
Leukocytes, UA: NEGATIVE
Nitrite, UA: NEGATIVE
Protein, UA: NEGATIVE
Spec Grav, UA: >=1.03 — AB (ref 1.010–1.025)
Urobilinogen, UA: 1 E.U./dL
pH, UA: 6.5 (ref 5.0–8.0)

## 2021-01-01 LAB — POCT UA - MICROALBUMIN
Albumin/Creatinine Ratio, Urine, POC: <30
Creatinine, POC: 300 mg/dL
Microalbumin Ur, POC: 80 mg/L

## 2021-01-01 NOTE — Progress Notes (Signed)
I,Katawbba Wiggins,acting as a Neurosurgeon for Meredith Aliment, MD.,have documented all relevant documentation on the behalf of Meredith Aliment, MD,as directed by  Meredith Aliment, MD while in the presence of Meredith Aliment, MD.  This visit occurred during the SARS-CoV-2 public health emergency.  Safety protocols were in place, including screening questions prior to the visit, additional usage of staff PPE, and extensive cleaning of exam room while observing appropriate contact time as indicated for disinfecting solutions.  Subjective:     Patient ID: Meredith Velez , female    DOB: 1986-03-08 , 35 y.o.   MRN: [id number]   Chief Complaint  Patient presents with   Establish Care     HPI  She is here today to establish primary care. She was referred by Dr. Su Hilt, her GYN. She reports history of high blood pressure and postpartum cardiomyopathy.  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 She has 2 children, both natural births. She is married.   She has been vaccinated and boosted against COVID.   Regarding the postpartum cardiomyopathy: Her initial echocardiogram 02/14/2019 showed EF of 20 to 25% at which time she was placed on Entresto and Toprol, moderate dose. Repeat echocardiogram 04/07/2019 showed EF of 40 to 45% improved from prior. Her most recent echo performed August 2021, EF 40%. Patient has since undergone a tubal ligation, knowing that she should not take Entresto while pregnant.     Hypertension This is a chronic problem. The current episode started more than 1 year ago. The problem has been gradually improving since onset. The problem is uncontrolled. Associated symptoms include anxiety. Pertinent negatives include no chest pain, headaches or palpitations. Past treatments include angiotensin blockers. The current treatment provides moderate improvement. Hypertensive end-organ damage includes heart failure.    Past Medical History:  Diagnosis Date   Anemia     Anxiety    Depression    pp depression   Frequent headaches    GERD (gastroesophageal reflux disease)    Hypertension    Migraines    Postpartum cardiomyopathy 02/14/2019   Seasonal allergies    Ulcer, stomach peptic    UTI (lower urinary tract infection)    Wears glasses      Family History  Problem Relation Age of Onset   Hypertension Mother    Hyperlipidemia Mother    Heart disease Mother    Kidney disease Mother    Diabetes Mother    Hypertension Father    Stroke Father    Diabetes Father    Hypertension Brother    Breast cancer Maternal Aunt    Lung cancer Maternal Uncle    Prostate cancer Maternal Grandmother    Prostate cancer Maternal Grandfather    Alzheimer's disease Paternal Grandmother      Current Outpatient Medications:    ipratropium (ATROVENT) 0.03 % nasal spray, as needed., Disp: , Rfl:    metoprolol succinate (TOPROL-XL) 100 MG 24 hr tablet, Take 1 tablet (100 mg total) by mouth daily. Take with or immediately following a meal., Disp: 90 tablet, Rfl: 0   sacubitril-valsartan (ENTRESTO) 49-51 MG, Take 1 tablet by mouth 2 (two) times daily., Disp: 180 tablet, Rfl: 0   spironolactone (ALDACTONE) 25 MG tablet, Take 0.5 tablets (12.5 mg total) by mouth daily., Disp: 45 tablet, Rfl: 0   valACYclovir (VALTREX) 1000 MG tablet, Take 1 tablet by mouth as needed. (Patient not taking: Reported on 01/08/2021), Disp: , Rfl:    furosemide (LASIX) 20 MG  tablet, Take 1 tablet (20 mg total) by mouth daily., Disp: 90 tablet, Rfl: 3   levocetirizine (XYZAL) 5 MG tablet, Take 5 mg by mouth daily as needed., Disp: , Rfl:    norethindrone (AYGESTIN) 5 MG tablet, Take 5 mg by mouth., Disp: , Rfl:    tinidazole (TINDAMAX) 500 MG tablet, Take 500 mg by mouth 2 (two) times daily as needed., Disp: , Rfl:    valACYclovir (VALTREX) 500 MG tablet, Take 500 mg by mouth 2 (two) times daily., Disp: , Rfl:    No Known Allergies   Review of Systems  Constitutional: Negative.   HENT:          Wants to have her r ear looked at, states it feels funny. No hearing issues. No drainage.   Respiratory: Negative.    Cardiovascular: Negative.  Negative for chest pain and palpitations.  Gastrointestinal: Negative.   Neurological:  Positive for numbness. Negative for headaches.       She c/o tingling/pain in RUE. Denies RUE weakness. Denies fall/trauma. No neck pain. She is not sure what is triggered her pain. Describes pain as burning pain. States arm feels warm on the inside, but not warm to touch.   Psychiatric/Behavioral:  The patient is nervous/anxious.     Today's Vitals   01/01/21 1602  BP: (!) 146/98  Pulse: (!) 107  Temp: 98.7 F (37.1 C)  TempSrc: Oral  Weight: 176 lb 3.2 oz (79.9 kg)  Height: 5\' 2"  (1.575 m)   Body mass index is 32.23 kg/m.  Wt Readings from Last 3 Encounters:  01/08/21 175 lb 9.6 oz (79.7 kg)  01/01/21 176 lb 3.2 oz (79.9 kg)  08/22/20 168 lb (76.2 kg)    BP Readings from Last 3 Encounters:  01/08/21 128/80  01/01/21 (!) 146/98  08/22/20 132/88    Objective:  Physical Exam Vitals and nursing note reviewed.  Constitutional:      Appearance: Normal appearance.  HENT:     Head: Normocephalic and atraumatic.     Right Ear: Ear canal and external ear normal. There is impacted cerumen.     Left Ear: Tympanic membrane, ear canal and external ear normal. There is no impacted cerumen.     Nose:     Comments: Masked     Mouth/Throat:     Comments: Masked  Cardiovascular:     Rate and Rhythm: Normal rate and regular rhythm.     Heart sounds: Normal heart sounds.  Pulmonary:     Effort: Pulmonary effort is normal.     Breath sounds: Normal breath sounds.  Musculoskeletal:     Cervical back: Normal range of motion.  Skin:    General: Skin is warm.  Neurological:     General: No focal deficit present.     Mental Status: She is alert.  Psychiatric:        Mood and Affect: Mood normal.        Behavior: Behavior normal.    LABS:    Echocardiogram August 2021     1. Left ventricular ejection fraction, by estimation, is 35 to 40%. The left ventricle has moderately decreased function. The left ventricle demonstrates global hypokinesis. The left ventricular internal cavity size was mildly dilated. Left ventricular diastolic parameters are consistent with Grade I diastolic dysfunction (impaired relaxation). 2. Right ventricular systolic function is normal. The right ventricular size is normal. 3. The mitral valve is normal in structure. Trivial mitral valve regurgitation. No evidence of mitral stenosis.  4. The aortic valve is tricuspid. Aortic valve regurgitation is not visualized. No aortic stenosis is present. 5. The inferior vena cava is normal in size with greater than 50% respiratory variability, suggesting right atrial pressure of 3 mmHg.  Assessment And Plan:     1. Hypertensive heart disease with other congestive heart failure (HCC) Comments: Chronic, also followed by Cardiology. Previous notes reviewed, encouraged to follow low sodium diet. She is encouraged to continue to work with her life coach. Importance of dietary, ,medication and exercise compliance was d/w pt. - POCT Urinalysis Dipstick (81002) - POCT UA - Microalbumin - EKG 12-Lead  2. Postpartum cardiomyopathy Comments: Please see above.   3. Renal insufficiency Comments: Labs from GYN reviewed in detail, she was advised GFR dropped. She is encouraged to keep BP controlled and to stay hydrated. I will BMP today.  4. Anxiety Comments: Chronic, currently working with a life coach which she has found to be beneficial. No needs for meds at this time.   5. Paresthesia and pain of right extremity Comments: I am not sure what is causing her sx. She is encouraged to pay attention to triggers. I will consider NCS if her sx persist. I will check labs as listed below.  - Vitamin B12  6. Right ear impacted cerumen AFTER OBTAINING VERBAL CONSENT, RIGHT  EAR WAS FLUSHED BY IRRIGATION. SHE TOLERATED PROCEDURE WELL WITHOUT ANY COMPLICATIONS. NO TM ABNORMALITIES WERE NOTED.  - Ear Lavage  7. Class 1 obesity due to excess calories with serious comorbidity and body mass index (BMI) of 32.0 to 32.9 in adult Comments: She is encouraged to gradually work up to 150 minutes of exercise per week for cardiac health, and to help achieve initial BMI goal less than 30.   8. Encounter for HCV screening test for low risk patient Comments: I will check HCV ab.  - Hepatitis C antibody    Patient was given opportunity to ask questions. Patient verbalized understanding of the plan and was able to repeat key elements of the plan. All questions were answered to their satisfaction.   I, Meredith Aliment, MD, have reviewed all documentation for this visit. The documentation on 01/01/21 for the exam, diagnosis, procedures, and orders are all accurate and complete.   IF YOU HAVE BEEN REFERRED TO A SPECIALIST, IT MAY TAKE 1-2 WEEKS TO SCHEDULE/PROCESS THE REFERRAL. IF YOU HAVE NOT HEARD FROM US/SPECIALIST IN TWO WEEKS, PLEASE GIVE [place] A CALL AT [phone].   THE PATIENT IS ENCOURAGED TO PRACTICE SOCIAL DISTANCING DUE TO THE COVID-19 PANDEMIC.

## 2021-01-02 LAB — VITAMIN B12: Vitamin B-12: 560 pg/mL (ref 232–1245)

## 2021-01-02 LAB — HEPATITIS C ANTIBODY: Hep C Virus Ab: <0.1 s/co ratio (ref 0.0–0.9)

## 2021-01-02 NOTE — Progress Notes (Signed)
Cardiology Office Note   Date:  01/08/2021   ID:  Meredith Velez, DOB 06-May-1986, MRN [id number]  PCP:  Dorothyann Peng, MD  Cardiologist: Dr. Anne Fu, MD   Chief Complaint  Patient presents with   Follow-up   History of Present Illness: Meredith Velez is a 35 y.o. female who presents for who presents for follow up, seen for Dr. Anne Fu.    [name] has a hx of preeclampsia peripartum cardiomyopathy, hypertension, anxiety, depression, and migraines seen by the cardiology service for postpartum cardiomyopathy during hospitalization in 2020.   Initial echocardiogram 02/14/2019 showed EF of 20 to 25% at which time she was placed on Entresto and Toprol, moderate dose. Repeat echocardiogram 04/07/2019 showed EF of 40 to 45% improved from prior. Patient understood that pregnancy while on Entresto could cause birth defects therefore she actually ended up having a tubal ligation with her last pregnancy.   She was last seen by Dr. Anne Fu 03/26/20 and reported feeling very overwhelmed with her current cardiac situation. She was struggling to take her medications and was feeling very depressed about her situation. She was continued on Entresto 49/51 dosing and worried that hypertension was playing a role in her cardiomyopathy. Plan was to have Charissa Bash follow with her. She is now participating in health coaching on a regular basis.  On last follow up with myself she had been set up with CSW>>receiving counseling including nutrition and exercise along with life coaching which has completely changed her outlook on her situation.   Today she is here for follow up and is doing exceptional. She continues to follow with CSW for emotional support and is doing well with that. She is asking to go back on Lasix daily from every other day dosing due to feeling "mildly swollen" from the hot summer. Her last creatinine levels have been great so I think this is acceptable. Her BP is great today at  128/80. She continues to tolerate her medications well. No chest pain, SOB, LE edema, palpitations, orthopnea. She recently got a new PCP who she likes.   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   Seasonal allergies    Ulcer, stomach peptic    UTI (lower urinary tract infection)    Wears glasses     Past Surgical History:  Procedure Laterality Date   COLONOSCOPY     LAPAROSCOPIC BILATERAL SALPINGECTOMY Bilateral 01/04/2019   Procedure: LAPAROSCOPIC BILATERAL SALPINGECTOMY;  Surgeon: Osborn Coho, MD;  [facility]: [facility];  Service: Gynecology;  Laterality: Bilateral;     Current Outpatient Medications  Medication Sig Dispense Refill   furosemide (LASIX) 20 MG tablet Take 1 tablet (20 mg total) by mouth daily. 90 tablet 3   ipratropium (ATROVENT) 0.03 % nasal spray as needed.     levocetirizine (XYZAL) 5 MG tablet Take 5 mg by mouth daily as needed.     metoprolol succinate (TOPROL-XL) 100 MG 24 hr tablet Take 1 tablet (100 mg total) by mouth daily. Take with or immediately following a meal. 90 tablet 0   sacubitril-valsartan (ENTRESTO) 49-51 MG Take 1 tablet by mouth 2 (two) times daily. 180 tablet 0   spironolactone (ALDACTONE) 25 MG tablet Take 0.5 tablets (12.5 mg total) by mouth daily.  45 tablet 0   tinidazole (TINDAMAX) 500 MG tablet Take 500 mg by mouth 2 (two) times daily as needed.     valACYclovir (VALTREX) 500 MG tablet Take 500 mg by mouth 2 (two) times daily.     norethindrone (AYGESTIN) 5 MG tablet Take 5 mg by mouth.     valACYclovir (VALTREX) 1000 MG tablet Take 1 tablet by mouth as needed. (Patient not taking: Reported on 01/08/2021)     No current facility-administered medications for this visit.    Allergies:   Patient has no known allergies.    Social History:   The patient  reports that she has never smoked. She has never used smokeless tobacco. She reports current alcohol use. She reports that she does not use drugs.   Family History:  The patient's family history includes Alzheimer's disease in her paternal grandmother; Breast cancer in her maternal aunt; Diabetes in her father and mother; Heart disease in her mother; Hyperlipidemia in her mother; Hypertension in her brother, father, and mother; Kidney disease in her mother; Lung cancer in her maternal uncle; Prostate cancer in her maternal grandfather and maternal grandmother; Stroke in her father.    ROS:  Please see the history of present illness. Otherwise, review of systems are positive for none.   All other systems are reviewed and negative.    PHYSICAL EXAM: VS:  BP 128/80   Pulse 89   Ht 5\' 2"  (1.575 m)   Wt 175 lb 9.6 oz (79.7 kg)   SpO2 97%   BMI 32.12 kg/m  , BMI Body mass index is 32.12 kg/m.  General: Well developed, well nourished, NAD Neck: Negative for carotid bruits. No JVD Lungs:Clear to ausculation bilaterally. No wheezes, rales, or rhonchi. Breathing is unlabored. Cardiovascular: RRR with S1 S2. No murmurs, rubs, gallops, or LV heave appreciated. Extremities: No edema. Neuro: Alert and oriented. No focal deficits. No facial asymmetry. MAE spontaneously. Psych: Responds to questions appropriately with normal affect.     EKG:  EKG is not ordered today.  Recent Labs: 06/14/2020: BUN 12; Creatinine, Ser 0.95; Potassium 4.1; Sodium 139    Lipid Panel    Component Value Date/Time   CHOL 126 05/11/2014 0937   TRIG 29.0 05/11/2014 0937   HDL 43.10 05/11/2014 0937   CHOLHDL 3 05/11/2014 0937   VLDL 5.8 05/11/2014 0937   LDLCALC 77 05/11/2014 0937     Wt Readings from Last 3 Encounters:  01/08/21 175 lb 9.6 oz (79.7 kg)  01/01/21 176 lb 3.2 oz (79.9 kg)  08/22/20 168 lb (76.2 kg)     Other studies Reviewed: Additional studies/ records that were reviewed today  include: . Review of the above records demonstrates:   Echocardiogram 02/14/2019:   1. The left ventricle has severely reduced systolic function, with an  ejection fraction of 20-25%. The cavity size was severely dilated. Left  ventricular diastolic Doppler parameters are consistent with  pseudonormalization. Elevated left ventricular  end-diastolic pressure.   2. The right ventricle has normal systolic function. The cavity was  normal. There is no increase in right ventricular wall thickness.   3. Mild thickening of the mitral valve leaflet. Mild calcification of the  mitral valve leaflet.   4. The aortic valve is tricuspid. Mild thickening of the aortic valve.  Mild calcification of the aortic valve.   5. The aorta is normal in size and structure.   6. The interatrial septum was not well visualized.   Echocardiogram 04/07/2019:   Left  ventricular ejection fraction, by visual estimation, is 40 to 45%. The left ventricle has moderately decreased function. Mildly increased left ventricular size. There is no left ventricular hypertrophy. 2. Apical window is foreshortened some, making evaluation difficult Compared to echo images from Aug 2020, LVEF appears improved. 3. Global right ventricle has normal systolic function.The right ventricular size is normal. No increase in right ventricular wall thickness.   ASSESSMENT AND PLAN:  1.  Peripartum cardiomyopathy: -Initial LVEF noted to be 20% in 2020 with repeat echocardiogram 04/07/2019 with improvement at 40 to 45%. Last echo 02/2020 with EF at 35-40% with G1 DD.  -Continue mid dose Entresto  -Obtain BMET today  -Continue on Entresto, spironolactone, Lasix and Toprol   2. Essential hypertension: -Stable, 128/80 -Continue current regimen    3. Depression: -Follows with CSW that includes counseling regarding nutrition, exercise and life coaching and she is thriving  -Doing better, very pleased    Current medicines are reviewed at  length with the patient today.  The patient does not have concerns regarding medicines.  The following changes have been made:  no change  Labs/ tests ordered today include: BMET, echo   Orders Placed This Encounter  Procedures   Basic metabolic panel   Basic metabolic panel   ECHOCARDIOGRAM COMPLETE   Disposition:   FU with Dr. Anne Fu in 6 months  Signed, Georgie Chard, NP  01/08/2021 4:39 PM    [facility] [address] [place], [place], [place]  [address] Phone: [phone]; Fax: [phone]

## 2021-01-08 ENCOUNTER — Other Ambulatory Visit: Payer: Self-pay

## 2021-01-08 ENCOUNTER — Encounter: Payer: Self-pay | Admitting: Cardiology

## 2021-01-08 ENCOUNTER — Ambulatory Visit: Payer: 59 | Admitting: Cardiology

## 2021-01-08 VITALS — BP 128/80 | HR 89 | Ht 62.0 in | Wt 175.6 lb

## 2021-01-08 DIAGNOSIS — I10 Essential (primary) hypertension: Secondary | ICD-10-CM

## 2021-01-08 DIAGNOSIS — O903 Peripartum cardiomyopathy: Secondary | ICD-10-CM

## 2021-01-08 DIAGNOSIS — F3289 Other specified depressive episodes: Secondary | ICD-10-CM

## 2021-01-08 MED ORDER — FUROSEMIDE 20 MG PO TABS: 20.0000 mg | ORAL_TABLET | Freq: Every day | ORAL | 3 refills | Status: DC

## 2021-01-08 NOTE — Patient Instructions (Addendum)
Medication Instructions:  Your physician has recommended you make the following change in your medication:   CHANGE the Lasix to 20 mg taking it daily    *If you need a refill on your cardiac medications before your next appointment, please call your pharmacy*   Lab Work: TODAY:  BMET  2-3 WEEKS:  BMET  If you have labs (blood work) drawn today and your tests are completely normal, you will receive your results only by: MyChart Message (if you have MyChart) OR A paper copy in the mail If you have any lab test that is abnormal or we need to change your treatment, we will call you to review the results.   Testing/Procedures: Your physician has requested that you have an echocardiogram PRIOR TO YOUR 6 MONTH APPT WITH DR. Anne Fu Echocardiography is a painless test that uses sound waves to create images of your heart. It provides your doctor with information about the size and shape of your heart and how well your heart's chambers and valves are working. This procedure takes approximately one hour. There are no restrictions for this procedure.    Follow-Up: At [facility], you and your health needs are our priority.  As part of our continuing mission to provide you with exceptional heart care, we have created designated Provider Care Teams.  These Care Teams include your primary Cardiologist (physician) and Advanced Practice Providers (APPs -  Physician Assistants and Nurse Practitioners) who all work together to provide you with the care you need, when you need it.  We recommend signing up for the patient portal called "MyChart".  Sign up information is provided on this After Visit Summary.  MyChart is used to connect with patients for Virtual Visits (Telemedicine).  Patients are able to view lab/test results, encounter notes, upcoming appointments, etc.  Non-urgent messages can be sent to your provider as well.   To learn more about what you can do with MyChart, go to  [email or web address].    Your next appointment:   6 month(s)  The format for your next appointment:   In Person  Provider:   You may see Donato Schultz, MD or one of the following Advanced Practice Providers on your designated Care Team:   Georgie Chard, NP   Other Instructions

## 2021-01-09 LAB — BASIC METABOLIC PANEL
BUN/Creatinine Ratio: 9 (ref 9–23)
BUN: 9 mg/dL (ref 6–20)
CO2: 21 mmol/L (ref 20–29)
Calcium: 9.4 mg/dL (ref 8.7–10.2)
Chloride: 104 mmol/L (ref 96–106)
Creatinine, Ser: 0.95 mg/dL (ref 0.57–1.00)
Glucose: 87 mg/dL (ref 65–99)
Potassium: 4.1 mmol/L (ref 3.5–5.2)
Sodium: 140 mmol/L (ref 134–144)
eGFR: 80 mL/min/{1.73_m2} (ref >59)

## 2021-01-10 ENCOUNTER — Encounter: Payer: Self-pay | Admitting: Internal Medicine

## 2021-01-21 ENCOUNTER — Other Ambulatory Visit: Payer: Self-pay

## 2021-01-21 ENCOUNTER — Other Ambulatory Visit: Payer: 59 | Admitting: *Deleted

## 2021-01-21 DIAGNOSIS — I10 Essential (primary) hypertension: Secondary | ICD-10-CM

## 2021-01-21 DIAGNOSIS — O903 Peripartum cardiomyopathy: Secondary | ICD-10-CM

## 2021-01-21 LAB — BASIC METABOLIC PANEL
BUN/Creatinine Ratio: 15 (ref 9–23)
BUN: 14 mg/dL (ref 6–20)
CO2: 23 mmol/L (ref 20–29)
Calcium: 9.5 mg/dL (ref 8.7–10.2)
Chloride: 102 mmol/L (ref 96–106)
Creatinine, Ser: 0.96 mg/dL (ref 0.57–1.00)
Glucose: 80 mg/dL (ref 65–99)
Potassium: 4.3 mmol/L (ref 3.5–5.2)
Sodium: 138 mmol/L (ref 134–144)
eGFR: 79 mL/min/{1.73_m2} (ref >59)

## 2021-01-26 DIAGNOSIS — N289 Disorder of kidney and ureter, unspecified: Secondary | ICD-10-CM | POA: Insufficient documentation

## 2021-01-26 DIAGNOSIS — R202 Paresthesia of skin: Secondary | ICD-10-CM | POA: Insufficient documentation

## 2021-01-26 DIAGNOSIS — H6121 Impacted cerumen, right ear: Secondary | ICD-10-CM | POA: Insufficient documentation

## 2021-01-26 DIAGNOSIS — M79609 Pain in unspecified limb: Secondary | ICD-10-CM | POA: Insufficient documentation

## 2021-02-07 ENCOUNTER — Institutional Professional Consult (permissible substitution): Payer: 59 | Admitting: Pulmonary Disease

## 2021-04-03 ENCOUNTER — Telehealth: Payer: Self-pay | Admitting: Cardiology

## 2021-04-03 MED ORDER — FUROSEMIDE 20 MG PO TABS: 20.0000 mg | ORAL_TABLET | Freq: Every day | ORAL | 2 refills | Status: DC

## 2021-04-03 MED ORDER — SPIRONOLACTONE 25 MG PO TABS: 12.5000 mg | ORAL_TABLET | Freq: Every day | ORAL | 2 refills | Status: DC

## 2021-04-03 MED ORDER — SACUBITRIL-VALSARTAN 49-51 MG PO TABS: 1.0000 | ORAL_TABLET | Freq: Two times a day (BID) | ORAL | 2 refills | Status: DC

## 2021-04-03 MED ORDER — METOPROLOL SUCCINATE ER 100 MG PO TB24: 100.0000 mg | ORAL_TABLET | Freq: Every day | ORAL | 2 refills | Status: DC

## 2021-04-03 NOTE — Telephone Encounter (Signed)
*  STAT* If patient is at the pharmacy, call can be transferred to refill team.   1. Which medications need to be refilled? (please list name of each medication and dose if known) new prescriptions for Metoprolol, Entresto, Spironolactone and Furosemide  2. Which pharmacy/location (including street and city if local pharmacy) is medication to be sent to? [facility] [place], [place]  3. Do they need a 30 day or 90 day supply? 30 days and refills

## 2021-04-03 NOTE — Telephone Encounter (Signed)
Pt's medications were sent to pt's pharmacy as requested. Confirmation received.  

## 2021-04-08 ENCOUNTER — Telehealth: Payer: Self-pay | Admitting: Licensed Clinical Social Worker

## 2021-04-08 NOTE — Telephone Encounter (Signed)
LCSW called and spoke with pt as a reminder for pt regarding [facility] tomorrow. Pt unfortunately cannot attend due to work, invited her any first Wednesday that she can join that we would love to have her.   Octavio Graves, MSW, LCSW [facility]  [phone]

## 2021-05-05 ENCOUNTER — Telehealth (HOSPITAL_COMMUNITY): Payer: Self-pay | Admitting: Licensed Clinical Social Worker

## 2021-05-05 ENCOUNTER — Other Ambulatory Visit: Payer: Self-pay

## 2021-05-05 ENCOUNTER — Encounter: Payer: Self-pay | Admitting: Internal Medicine

## 2021-05-05 ENCOUNTER — Ambulatory Visit: Payer: 59 | Admitting: Internal Medicine

## 2021-05-05 VITALS — BP 124/96 | HR 93 | Temp 98.0°F | Ht 62.0 in | Wt 176.6 lb

## 2021-05-05 DIAGNOSIS — K644 Residual hemorrhoidal skin tags: Secondary | ICD-10-CM | POA: Diagnosis not present

## 2021-05-05 DIAGNOSIS — Z6832 Body mass index (BMI) 32.0-32.9, adult: Secondary | ICD-10-CM

## 2021-05-05 DIAGNOSIS — I110 Hypertensive heart disease with heart failure: Secondary | ICD-10-CM | POA: Diagnosis not present

## 2021-05-05 DIAGNOSIS — Z2821 Immunization not carried out because of patient refusal: Secondary | ICD-10-CM

## 2021-05-05 DIAGNOSIS — I5089 Other heart failure: Secondary | ICD-10-CM

## 2021-05-05 DIAGNOSIS — E66811 Obesity, class 1: Secondary | ICD-10-CM

## 2021-05-05 DIAGNOSIS — O903 Peripartum cardiomyopathy: Secondary | ICD-10-CM | POA: Diagnosis not present

## 2021-05-05 DIAGNOSIS — E6609 Other obesity due to excess calories: Secondary | ICD-10-CM

## 2021-05-05 DIAGNOSIS — K5909 Other constipation: Secondary | ICD-10-CM

## 2021-05-05 MED ORDER — HYDROCORTISONE ACETATE 25 MG RE SUPP: 25.0000 mg | Freq: Two times a day (BID) | RECTAL | 1 refills | Status: DC

## 2021-05-05 NOTE — Progress Notes (Signed)
I,Katawbba Wiggins,acting as a Neurosurgeon for [name], MD.,have documented all relevant documentation on the behalf of [name], MD,as directed by  [name], MD while in the presence of [name], MD.  This visit occurred during the SARS-CoV-2 public health emergency.  Safety protocols were in place, including screening questions prior to the visit, additional usage of staff PPE, and extensive cleaning of exam room while observing appropriate contact time as indicated for disinfecting solutions.  Subjective:     Patient ID: Meredith Velez , female    DOB: July 03, 1986 , 35 y.o.   MRN: [id number]   Chief Complaint  Patient presents with   Hypertension   Hemorrhoids    HPI  The patient is here for a follow-up on her blood pressure. She reports compliance with meds. Denies headaches, chest pain and palpitations.   Hypertension This is a chronic problem. The current episode started more than 1 year ago. The problem has been gradually improving since onset. The problem is uncontrolled. Associated symptoms include anxiety. Pertinent negatives include no chest pain, headaches or palpitations. Past treatments include angiotensin blockers. The current treatment provides moderate improvement. Hypertensive end-organ damage includes heart failure.    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   Seasonal allergies    Ulcer, stomach peptic    UTI (lower urinary tract infection)    Wears glasses      Family History  Problem Relation Age of Onset   Hypertension Mother    Hyperlipidemia Mother    Heart disease Mother    Kidney disease Mother    Diabetes Mother    Hypertension Father    Stroke Father    Diabetes Father    Hypertension Brother    Breast cancer Maternal Aunt    Lung  cancer Maternal Uncle    Prostate cancer Maternal Grandmother    Prostate cancer Maternal Grandfather    Alzheimer's disease Paternal Grandmother      Current Outpatient Medications:    furosemide (LASIX) 20 MG tablet, Take 1 tablet (20 mg total) by mouth daily., Disp: 90 tablet, Rfl: 2   ipratropium (ATROVENT) 0.03 % nasal spray, as needed., Disp: , Rfl:    levocetirizine (XYZAL) 5 MG tablet, Take 5 mg by mouth daily as needed., Disp: , Rfl:    metoprolol succinate (TOPROL-XL) 100 MG 24 hr tablet, Take 1 tablet (100 mg total) by mouth daily. Take with or immediately following a meal., Disp: 90 tablet, Rfl: 2   norethindrone (AYGESTIN) 5 MG tablet, Take 5 mg by mouth., Disp: , Rfl:    sacubitril-valsartan (ENTRESTO) 49-51 MG, Take 1 tablet by mouth 2 (two) times daily., Disp: 180 tablet, Rfl: 2   spironolactone (ALDACTONE) 25 MG tablet, Take 0.5 tablets (12.5 mg total) by mouth daily., Disp: 45 tablet, Rfl: 2   tinidazole (TINDAMAX) 500 MG tablet, Take 500 mg by mouth 2 (two) times daily as needed., Disp: , Rfl:    valACYclovir (VALTREX) 1000 MG tablet, Take 1 tablet by mouth as needed., Disp: , Rfl:    valACYclovir (VALTREX) 500 MG tablet, Take 500 mg by mouth 2 (two) times daily., Disp: , Rfl:    hydrocortisone (ANUSOL-HC) 25 MG suppository, Place 1 suppository (25 mg total) rectally  every 12 (twelve) hours., Disp: 12 suppository, Rfl: 1   No Known Allergies   Review of Systems  Constitutional: Negative.   Respiratory: Negative.    Cardiovascular: Negative.  Negative for chest pain and palpitations.  Gastrointestinal:  Positive for blood in stool.       She admits to having hemorrhoids. She has not had any relief with OTC remedies. This is not new for her.   Neurological: Negative.  Negative for headaches.  Psychiatric/Behavioral: Negative.      Today's Vitals   05/05/21 1559  BP: (!) 124/96  Pulse: 93  Temp: 98 F (36.7 C)  Weight: 176 lb 9.6 oz (80.1 kg)  Height: 5\' 2"   (1.575 m)   Body mass index is 32.3 kg/m.  Wt Readings from Last 3 Encounters:  05/05/21 176 lb 9.6 oz (80.1 kg)  01/08/21 175 lb 9.6 oz (79.7 kg)  01/01/21 176 lb 3.2 oz (79.9 kg)    BP Readings from Last 3 Encounters:  05/05/21 (!) 124/96  01/08/21 128/80  01/01/21 (!) 146/98    Objective:  Physical Exam Vitals and nursing note reviewed.  Constitutional:      Appearance: Normal appearance.  HENT:     Head: Normocephalic and atraumatic.     Nose:     Comments: Masked     Mouth/Throat:     Comments: Masked  Cardiovascular:     Rate and Rhythm: Normal rate and regular rhythm.     Heart sounds: Normal heart sounds.  Pulmonary:     Effort: Pulmonary effort is normal.     Breath sounds: Normal breath sounds.  Genitourinary:    Rectum: External hemorrhoid present.     Comments: DRE declined by patient. External rectum examined only.  Musculoskeletal:     Cervical back: Normal range of motion.  Skin:    General: Skin is warm.  Neurological:     General: No focal deficit present.     Mental Status: She is alert.  Psychiatric:        Mood and Affect: Mood normal.        Behavior: Behavior normal.     LABS: 2D echo results August 2021 1. Left ventricular ejection fraction, by estimation, is 35 to 40%. The left ventricle has moderately decreased function. The left ventricle demonstrates global hypokinesis. The left ventricular internal cavity size was mildly dilated. Left ventricular diastolic parameters are consistent with Grade I diastolic dysfunction (impaired relaxation). 2. Right ventricular systolic function is normal. The right ventricular size is normal. 3. The mitral valve is normal in structure. Trivial mitral valve regurgitation. No evidence of mitral stenosis. 4. The aortic valve is tricuspid. Aortic valve regurgitation is not visualized. No aortic stenosis is present. 5. The inferior vena cava is normal in size with greater than 50% respiratory  variability, suggesting right atrial pressure of 3 mmHg.    Assessment And Plan:     1. Hypertensive heart disease with other congestive heart failure (HCC) Comments: Chronic, fair control. I believe diastolic elevation is due to pain. No med changes today. Encouraged to follow low sodium diet.  2. Postpartum cardiomyopathy Comments: Chronic, stable. Cardiology input appreciated. She has upcoming echocardiogram, encouraged to c/w lifestyle changes.  3. External bleeding hemorrhoids Comments: I will send rx Anusol-HC suppositories. She will let me know if her sx persist.  4. Chronic constipation Comments: Chronic, encouraged to try Miralax daily. She will add stool softener if needed.   5. Class 1 obesity due to excess  calories with serious comorbidity and body mass index (BMI) of 32.0 to 32.9 in adult Comments: She is encouraged to strive for BMI less than 30 to decrease cardiac risk. Advised to aim for at least 150 minutes of exercise per week.   6. Pneumococcal vaccination declined  Patient was given opportunity to ask questions. Patient verbalized understanding of the plan and was able to repeat key elements of the plan. All questions were answered to their satisfaction.   I, Maximino Greenland, MD, have reviewed all documentation for this visit. The documentation on 05/05/21 for the exam, diagnosis, procedures, and orders are all accurate and complete.   IF YOU HAVE BEEN REFERRED TO A SPECIALIST, IT MAY TAKE 1-2 WEEKS TO SCHEDULE/PROCESS THE REFERRAL. IF YOU HAVE NOT HEARD FROM US/SPECIALIST IN TWO WEEKS, PLEASE GIVE [place] A CALL AT [phone].   THE PATIENT IS ENCOURAGED TO PRACTICE SOCIAL DISTANCING DUE TO THE COVID-19 PANDEMIC.

## 2021-05-05 NOTE — Patient Instructions (Addendum)
Dulcolax  Cooking With Less Salt Cooking with less salt is one way to reduce the amount of sodium you get from food. Sodium is one of the elements that make up salt. It is found naturally in foods and is also added to certain foods. Depending on your condition and overall health, your health care provider or dietitian may recommend that you reduce your sodium intake. Most people should have less than 2,300 milligrams (mg) of sodium each day. If you have high blood pressure (hypertension), you may need to limit your sodium to 1,500 mg each day. Follow the tips below to help reduce your sodium intake. What are tips for eating less sodium? Reading food labels  Check the food label before buying or using packaged ingredients. Always check the label for the serving size and sodium content. Look for products with no more than 140 mg of sodium in one serving. Check the % Daily Value column to see what percent of the daily recommended amount of sodium is provided in one serving of the product. Foods with 5% or less in this column are considered low in sodium. Foods with 20% or higher are considered high in sodium. Do not choose foods with salt as one of the first three ingredients on the ingredients list. If salt is one of the first three ingredients, it usually means the item is high in sodium. Shopping Buy sodium-free or low-sodium products. Look for the following words on food labels: Low-sodium. Sodium-free. Reduced-sodium. No salt added. Unsalted. Always check the sodium content even if foods are labeled as low-sodium or no salt added. Buy fresh foods. Cooking Use herbs, seasonings without salt, and spices as substitutes for salt. Use sodium-free baking soda when baking. Grill, braise, or roast foods to add flavor with less salt. Avoid adding salt to pasta, rice, or hot cereals. Drain and rinse canned vegetables, beans, and meat before use. Avoid adding salt when cooking sweets and  desserts. Cook with low-sodium ingredients. What foods are high in sodium? Vegetables Regular canned vegetables (not low-sodium or reduced-sodium). Sauerkraut, pickled vegetables, and relishes. Olives. [place] fries. Onion rings. Regular canned tomato sauce and paste. Regular tomato and vegetable juice. Frozen vegetables in sauces. Grains Instant hot cereals. Bread stuffing, pancake, and biscuit mixes. Croutons. Seasoned rice or pasta mixes. Noodle soup cups. Boxed or frozen macaroni and cheese. Regular salted crackers. Self-rising flour. Rolls. Bagels. Flour tortillas and wraps. Meats and other proteins Meat or fish that is salted, canned, smoked, cured, spiced, or pickled. This includes bacon, ham, sausages, hot dogs, corned beef, chipped beef, meat loaves, salt pork, jerky, pickled herring, anchovies, regular canned tuna, and sardines. Salted nuts. Dairy Processed cheese and cheese spreads. Cheese curds. Blue cheese. Feta cheese. String cheese. Regular cottage cheese. Buttermilk. Canned milk. The items listed above may not be a complete list of foods high in sodium. Actual amounts of sodium may be different depending on processing. Contact a dietitian for more information. What foods are low in sodium? Fruits Fresh, frozen, or canned fruit with no sauce added. Fruit juice. Vegetables Fresh or frozen vegetables with no sauce added. "No salt added" canned vegetables. "No salt added" tomato sauce and paste. Low-sodium or reduced-sodium tomato and vegetable juice. Grains Noodles, pasta, quinoa, rice. Shredded or puffed wheat or puffed rice. Regular or quick oats (not instant). Low-sodium crackers. Low-sodium bread. Whole-grain bread and whole-grain pasta. Unsalted popcorn. Meats and other proteins Fresh or frozen whole meats, poultry (not injected with sodium), and fish with no  sauce added. Unsalted nuts. Dried peas, beans, and lentils without added salt. Unsalted canned beans. Eggs. Unsalted nut  butters. Low-sodium canned tuna or chicken. Dairy Milk. Soy milk. Yogurt. Low-sodium cheeses, such as Swiss, [address], [place], and [employer]. Sherbet or ice cream (keep to  cup per serving). Cream cheese. Fats and oils Unsalted butter or margarine. Other foods Homemade pudding. Sodium-free baking soda and baking powder. Herbs and spices. Low-sodium seasoning mixes. Beverages Coffee and tea. Carbonated beverages. The items listed above may not be a complete list of foods low in sodium. Actual amounts of sodium may be different depending on processing. Contact a dietitian for more information. What are some salt alternatives when cooking? The following are herbs, seasonings, and spices that can be used instead of salt to flavor your food. Herbs should be fresh or dried. Do not choose packaged mixes. Next to the name of the herb, spice, or seasoning are some examples of foods you can pair it with. Herbs [place] leaves - Soups, meat and vegetable dishes, and spaghetti sauce. Basil - [employer], soups, pasta, and fish dishes. Cilantro - Meat, poultry, and vegetable dishes. Chili powder - Marinades and Mexican dishes. Chives - Salad dressings and potato dishes. Cumin - Mexican dishes, couscous, and meat dishes. Dill - Fish dishes, sauces, and salads. Fennel - Meat and vegetable dishes, breads, and cookies. Garlic (do not use garlic salt) - [place] dishes, meat dishes, salad dressings, and sauces. Marjoram - Soups, potato dishes, and meat dishes. Oregano - Pizza and spaghetti sauce. Parsley - Salads, soups, pasta, and meat dishes. Rosemary - [place] dishes, salad dressings, soups, and red meats. Saffron - Fish dishes, pasta, and some poultry dishes. Sage - Stuffings and sauces. Tarragon - Fish and [employer]. Thyme - Stuffing, meat, and fish dishes. Seasonings Lemon juice - Fish dishes, poultry dishes, vegetables, and salads. Vinegar - Salad dressings, vegetables, and fish  dishes. Spices Cinnamon - Sweet dishes, such as cakes, cookies, and puddings. Cloves - Gingerbread, puddings, and marinades for meats. Curry - Vegetable dishes, fish and poultry dishes, and stir-fry dishes. Ginger - Vegetable dishes, fish dishes, and stir-fry dishes. Nutmeg - Pasta, vegetables, poultry, fish dishes, and custard. Summary Cooking with less salt is one way to reduce the amount of sodium that you get from food. Buy sodium-free or low-sodium products. Check the food label before using or buying packaged ingredients. Use herbs, seasonings without salt, and spices as substitutes for salt in foods. This information is not intended to replace advice given to you by your health care provider. Make sure you discuss any questions you have with your health care provider. Document Revised: 06/14/2019 Document Reviewed: 06/14/2019 Elsevier Patient Education  2022 [employer].

## 2021-05-05 NOTE — Telephone Encounter (Signed)
CSW called pt to remind of [facility] this Wednesday.  Pt is planning on attending at this time- CSW confirmed time/date/location.  Burna Sis, LCSW Clinical Social Worker [facility] Desk#: [phone] Cell#: [phone]

## 2021-06-09 ENCOUNTER — Telehealth (HOSPITAL_COMMUNITY): Payer: Self-pay | Admitting: Licensed Clinical Social Worker

## 2021-06-09 NOTE — Telephone Encounter (Signed)
CSW reached out to remind of [facility] this week.  Pt will hope to come but unsure with her schedule at this time- appreciates the reminder.    Burna Sis, LCSW Clinical Social Worker [facility] Desk#: [phone] Cell#: [phone]

## 2021-06-24 ENCOUNTER — Ambulatory Visit (HOSPITAL_COMMUNITY): Payer: 59 | Attending: Cardiology

## 2021-06-24 ENCOUNTER — Other Ambulatory Visit: Payer: Self-pay

## 2021-06-24 DIAGNOSIS — I428 Other cardiomyopathies: Secondary | ICD-10-CM | POA: Diagnosis not present

## 2021-06-24 DIAGNOSIS — I10 Essential (primary) hypertension: Secondary | ICD-10-CM | POA: Diagnosis not present

## 2021-06-24 DIAGNOSIS — O903 Peripartum cardiomyopathy: Secondary | ICD-10-CM | POA: Insufficient documentation

## 2021-06-24 LAB — ECHOCARDIOGRAM COMPLETE
Area-P 1/2: 4.53 cm2
S' Lateral: 3.55 cm

## 2021-07-03 ENCOUNTER — Encounter: Payer: Self-pay | Admitting: *Deleted

## 2021-07-04 ENCOUNTER — Telehealth (HOSPITAL_COMMUNITY): Payer: Self-pay | Admitting: Licensed Clinical Social Worker

## 2021-07-04 NOTE — Telephone Encounter (Signed)
CSW called pt to remind of [facility]- pt planning to attend  Burna Sis, LCSW Clinical Social Worker [facility] Desk#: [phone] Cell#: [phone]

## 2021-07-08 ENCOUNTER — Telehealth: Payer: Self-pay | Admitting: Cardiology

## 2021-07-08 NOTE — Telephone Encounter (Signed)
Spoke with pt and made her aware of echo results.  Pt verbalized understanding and was in agreement with plan.

## 2021-07-08 NOTE — Telephone Encounter (Signed)
Patient is returning call regarding echo results.  

## 2021-08-04 ENCOUNTER — Telehealth (HOSPITAL_COMMUNITY): Payer: Self-pay | Admitting: Licensed Clinical Social Worker

## 2021-08-04 NOTE — Telephone Encounter (Signed)
CSW called pt to remind of [facility] this coming Wednesday at 10am- pt unsure if she will be able to come due to work but will try  Meredith Sis, LCSW Clinical Social Worker [facility] Desk#: [phone] Cell#: [phone]

## 2021-09-08 ENCOUNTER — Other Ambulatory Visit: Payer: Self-pay | Admitting: Obstetrics and Gynecology

## 2021-09-22 ENCOUNTER — Ambulatory Visit (INDEPENDENT_AMBULATORY_CARE_PROVIDER_SITE_OTHER): Payer: 59 | Admitting: Internal Medicine

## 2021-09-22 ENCOUNTER — Other Ambulatory Visit: Payer: Self-pay

## 2021-09-22 ENCOUNTER — Encounter: Payer: Self-pay | Admitting: Internal Medicine

## 2021-09-22 VITALS — BP 120/82 | HR 81 | Temp 97.6°F | Ht 62.0 in | Wt 182.0 lb

## 2021-09-22 DIAGNOSIS — E6609 Other obesity due to excess calories: Secondary | ICD-10-CM | POA: Insufficient documentation

## 2021-09-22 DIAGNOSIS — Z6833 Body mass index (BMI) 33.0-33.9, adult: Secondary | ICD-10-CM

## 2021-09-22 DIAGNOSIS — I110 Hypertensive heart disease with heart failure: Secondary | ICD-10-CM

## 2021-09-22 DIAGNOSIS — E66811 Obesity, class 1: Secondary | ICD-10-CM | POA: Insufficient documentation

## 2021-09-22 DIAGNOSIS — I5089 Other heart failure: Secondary | ICD-10-CM

## 2021-09-22 DIAGNOSIS — Z0000 Encounter for general adult medical examination without abnormal findings: Secondary | ICD-10-CM

## 2021-09-22 DIAGNOSIS — Z23 Encounter for immunization: Secondary | ICD-10-CM

## 2021-09-22 DIAGNOSIS — I119 Hypertensive heart disease without heart failure: Secondary | ICD-10-CM | POA: Insufficient documentation

## 2021-09-22 DIAGNOSIS — N946 Dysmenorrhea, unspecified: Secondary | ICD-10-CM

## 2021-09-22 DIAGNOSIS — O903 Peripartum cardiomyopathy: Secondary | ICD-10-CM

## 2021-09-22 LAB — POCT URINALYSIS DIPSTICK
Bilirubin, UA: NEGATIVE
Glucose, UA: NEGATIVE
Ketones, UA: NEGATIVE
Leukocytes, UA: NEGATIVE
Nitrite, UA: NEGATIVE
Protein, UA: NEGATIVE
Spec Grav, UA: 1.02 (ref 1.010–1.025)
Urobilinogen, UA: 0.2 E.U./dL
pH, UA: 5.5 (ref 5.0–8.0)

## 2021-09-22 NOTE — Progress Notes (Signed)
?Jeri Cos [place]leton,acting as a Neurosurgeon for [name], MD.,have documented all relevant documentation on the behalf of [name], MD,as directed by  [name], MD while in the presence of [name], MD.  ?This visit occurred during the SARS-CoV-2 public health emergency.  Safety protocols were in place, including screening questions prior to the visit, additional usage of staff PPE, and extensive cleaning of exam room while observing appropriate contact time as indicated for disinfecting solutions. ? ?Subjective:  ?  ? Patient ID: Meredith Velez , female    DOB: [birth year] , 36 y.o.   MRN: [id number] ? ? ?Chief Complaint  ?Patient presents with  ? Annual Exam  ? ? ?HPI ? ?Patient presents today for a physical. She sees Dr.Roberts for her GYN care. Unfortunately, she is dealing w/ painful menstrual cramps and heavy cycles. She recently had endometrial biopsy with Dr. Su Hilt and is scheduled to go over her results next week. She reports compliance with meds. She does not have any other questions or concerns at this time.  ? ? ?Hypertension ?This is a chronic problem. The current episode started more than 1 year ago. The problem has been gradually improving since onset. The problem is uncontrolled. Associated symptoms include anxiety. Pertinent negatives include no chest pain, headaches or palpitations. Past treatments include angiotensin blockers. The current treatment provides moderate improvement. Hypertensive end-organ damage includes heart failure.   ? ?Past Medical History:  ?Diagnosis Date  ? Anemia   ? Anxiety   ? Depression   ? pp depression  ? Frequent headaches   ? GERD (gastroesophageal reflux disease)   ? Hypertension   ? Migraines   ? 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 Seasonal allergies   ? Ulcer, stomach peptic   ? UTI (lower urinary tract infection)   ? Wears glasses   ?  ? ?Family History  ?Problem Relation Age of Onset  ? Hypertension Mother   ? Hyperlipidemia  Mother   ? Heart disease Mother   ? Kidney disease Mother   ? Diabetes Mother   ? Hypertension Father   ? Stroke Father   ? Diabetes Father   ? Hypertension Brother   ? Breast cancer Maternal Aunt   ? Lung cancer Maternal Uncle   ? Prostate cancer Maternal Grandmother   ? Prostate cancer Maternal Grandfather   ? Alzheimer's disease Paternal Grandmother   ? ? ? ?Current Outpatient Medications:  ?  Cholecalciferol (VITAMIN D3) 50 MCG (2000 UT) TABS, Take 1 tablet by mouth daily at 12 noon., Disp: , Rfl:  ?  furosemide (LASIX) 20 MG tablet, Take 1 tablet (20 mg total) by mouth daily., Disp: 90 tablet, Rfl: 2 ?  hydrocortisone (ANUSOL-HC) 25 MG suppository, Place 1 suppository (25 mg total) rectally every 12 (twelve) hours., Disp: 12 suppository, Rfl: 1 ?  ipratropium (ATROVENT) 0.03 % nasal spray, as needed., Disp: , Rfl:  ?  levocetirizine (XYZAL) 5 MG tablet, Take 5 mg by mouth daily as needed., Disp: , Rfl:  ?  metoprolol succinate (TOPROL-XL) 100 MG 24 hr tablet, Take 1 tablet (100 mg total) by mouth daily. Take with or immediately following a meal., Disp: 90 tablet, Rfl: 2 ?  sacubitril-valsartan (ENTRESTO) 49-51 MG, Take 1 tablet by mouth 2 (two) times daily., Disp: 180 tablet, Rfl: 2 ?  spironolactone (ALDACTONE) 25 MG tablet, Take 0.5 tablets (12.5 mg total) by mouth daily., Disp: 45 tablet, Rfl: 2 ?  tinidazole (TINDAMAX) 500 MG tablet,  Take 500 mg by mouth 2 (two) times daily as needed., Disp: , Rfl:  ?  valACYclovir (VALTREX) 1000 MG tablet, Take 1 tablet by mouth as needed., Disp: , Rfl:  ?  norethindrone (AYGESTIN) 5 MG tablet, Take 5 mg by mouth. (Patient not taking: Reported on 09/22/2021), Disp: , Rfl:   ? ?No Known Allergies  ? ? ?The patient states she uses tubal ligation for birth control. Last LMP was Patient's last menstrual period was 09/21/2021.. Negative for Dysmenorrhea. Negative for: breast discharge, breast lump(s), breast pain and breast self exam. Associated symptoms include abnormal vaginal  bleeding. Pertinent negatives include abnormal bleeding (hematology), anxiety, decreased libido, depression, difficulty falling sleep, dyspareunia, history of infertility, nocturia, sexual dysfunction, sleep disturbances, urinary incontinence, urinary urgency, vaginal discharge and vaginal itching. Diet regular.The patient states her exercise level is  intermittent.  ? . The patient's tobacco use is:  ?Social History  ? ?Tobacco Use  ?Smoking Status Never  ?Smokeless Tobacco Never  ?Marland Kitchen She has been exposed to passive smoke. The patient's alcohol use is:  ?Social History  ? ?Substance and Sexual Activity  ?Alcohol Use Yes  ? Comment: occ  ? ?Review of Systems  ?Constitutional: Negative.   ?HENT: Negative.    ?Eyes: Negative.   ?Respiratory: Negative.    ?Cardiovascular: Negative.  Negative for chest pain and palpitations.  ?Gastrointestinal: Negative.   ?Endocrine: Negative.   ?Genitourinary:  Positive for menstrual problem.  ?Musculoskeletal: Negative.   ?Skin: Negative.   ?Allergic/Immunologic: Negative.   ?Neurological: Negative.  Negative for headaches.  ?Hematological: Negative.   ?Psychiatric/Behavioral: Negative.     ? ?Today's Vitals  ? 09/22/21 0939  ?BP: 120/82  ?Pulse: 81  ?Temp: 97.6 ?F (36.4 ?C)  ?Weight: 182 lb (82.6 kg)  ?Height: 5\' 2"  (1.575 m)  ?PainSc: 6   ?PainLoc: Abdomen  ? ?Body mass index is 33.29 kg/m?.  ?Wt Readings from Last 3 Encounters:  ?09/22/21 182 lb (82.6 kg)  ?05/05/21 176 lb 9.6 oz (80.1 kg)  ?01/08/21 175 lb 9.6 oz (79.7 kg)  ?  ? ?Objective:  ?Physical Exam ?Vitals and nursing note reviewed.  ?Constitutional:   ?   Appearance: Normal appearance.  ?HENT:  ?   Head: Normocephalic and atraumatic.  ?   Right Ear: Tympanic membrane, ear canal and external ear normal.  ?   Left Ear: Tympanic membrane, ear canal and external ear normal.  ?   Nose:  ?   Comments: Masked  ?   Mouth/Throat:  ?   Comments: Masked  ?Eyes:  ?   Extraocular Movements: Extraocular movements intact.  ?    Conjunctiva/sclera: Conjunctivae normal.  ?   Pupils: Pupils are equal, round, and reactive to light.  ?Cardiovascular:  ?   Rate and Rhythm: Normal rate and regular rhythm.  ?   Pulses: Normal pulses.  ?   Heart sounds: Normal heart sounds.  ?Pulmonary:  ?   Effort: Pulmonary effort is normal.  ?   Breath sounds: Normal breath sounds.  ?Chest:  ?Breasts: ?   Tanner Score is 5.  ?   Right: Normal.  ?   Left: Normal.  ?Abdominal:  ?   General: Bowel sounds are normal.  ?   Palpations: Abdomen is soft.  ?Genitourinary: ?   Comments: deferred ?Musculoskeletal:     ?   General: Normal range of motion.  ?   Cervical back: Normal range of motion and neck supple.  ?Skin: ?   General: Skin is  warm and dry.  ?   Comments: Tattoos on r foot, above r breast  ?Neurological:  ?   General: No focal deficit present.  ?   Mental Status: She is alert and oriented to person, place, and time.  ?Psychiatric:     ?   Mood and Affect: Mood normal.     ?   Behavior: Behavior normal.  ?   ?Assessment And Plan:  ?   ?1. Encounter for general adult medical examination w/o abnormal findings ?Comments: A full exam was performed. Importance of monthly self breast exams was discussed with the patient. PATIENT IS ADVISED TO GET 30-45 MINUTES REGULAR EXERCISE NO LESS THAN FOUR TO FIVE DAYS PER WEEK - BOTH WEIGHTBEARING EXERCISES AND AEROBIC ARE RECOMMENDED.  PATIENT IS ADVISED TO FOLLOW A HEALTHY DIET WITH AT LEAST SIX FRUITS/VEGGIES PER DAY, DECREASE INTAKE OF RED MEAT, AND TO INCREASE FISH INTAKE TO TWO DAYS PER WEEK.  MEATS/FISH SHOULD NOT BE FRIED, BAKED OR BROILED IS PREFERABLE.  IT IS ALSO IMPORTANT TO CUT BACK ON YOUR SUGAR INTAKE. PLEASE AVOID ANYTHING WITH ADDED SUGAR, CORN SYRUP OR OTHER SWEETENERS. IF YOU MUST USE A SWEETENER, YOU CAN TRY STEVIA. IT IS ALSO IMPORTANT TO AVOID ARTIFICIALLY SWEETENERS AND DIET BEVERAGES. LASTLY, I SUGGEST WEARING SPF 50 SUNSCREEN ON EXPOSED PARTS AND ESPECIALLY WHEN IN THE DIRECT SUNLIGHT FOR AN EXTENDED  PERIOD OF TIME.  PLEASE AVOID FAST FOOD RESTAURANTS AND INCREASE YOUR WATER INTAKE. ?- Lipid panel ? ?2. Hypertensive heart disease with other congestive heart failure (HCC) ?Comments: Chronic, well controlled.

## 2021-09-22 NOTE — Patient Instructions (Signed)

## 2021-09-23 LAB — CMP14+EGFR
ALT: 15 IU/L (ref 0–32)
AST: 22 IU/L (ref 0–40)
Albumin/Globulin Ratio: 1.6 (ref 1.2–2.2)
Albumin: 5 g/dL — ABNORMAL HIGH (ref 3.8–4.8)
Alkaline Phosphatase: 89 IU/L (ref 44–121)
BUN/Creatinine Ratio: 14 (ref 9–23)
BUN: 14 mg/dL (ref 6–20)
Bilirubin Total: 0.5 mg/dL (ref 0.0–1.2)
CO2: 24 mmol/L (ref 20–29)
Calcium: 10.3 mg/dL — ABNORMAL HIGH (ref 8.7–10.2)
Chloride: 98 mmol/L (ref 96–106)
Creatinine, Ser: 1.01 mg/dL — ABNORMAL HIGH (ref 0.57–1.00)
Globulin, Total: 3.2 g/dL (ref 1.5–4.5)
Glucose: 89 mg/dL (ref 70–99)
Potassium: 4.7 mmol/L (ref 3.5–5.2)
Sodium: 138 mmol/L (ref 134–144)
Total Protein: 8.2 g/dL (ref 6.0–8.5)
eGFR: 74 mL/min/{1.73_m2} (ref >59)

## 2021-09-23 LAB — LIPID PANEL
Chol/HDL Ratio: 3.3 ratio (ref 0.0–4.4)
Cholesterol, Total: 144 mg/dL (ref 100–199)
HDL: 44 mg/dL (ref >39)
LDL Chol Calc (NIH): 85 mg/dL (ref 0–99)
Triglycerides: 74 mg/dL (ref 0–149)
VLDL Cholesterol Cal: 15 mg/dL (ref 5–40)

## 2021-09-23 LAB — MICROALBUMIN / CREATININE URINE RATIO
Creatinine, Urine: 65.8 mg/dL
Microalb/Creat Ratio: 82 mg/g creat — ABNORMAL HIGH (ref 0–29)
Microalbumin, Urine: 53.9 ug/mL

## 2021-09-23 LAB — CBC
Hematocrit: 40.4 % (ref 34.0–46.6)
Hemoglobin: 13.5 g/dL (ref 11.1–15.9)
MCH: 30.7 pg (ref 26.6–33.0)
MCHC: 33.4 g/dL (ref 31.5–35.7)
MCV: 92 fL (ref 79–97)
Platelets: 345 10*3/uL (ref 150–450)
RBC: 4.4 x10E6/uL (ref 3.77–5.28)
RDW: 12.8 % (ref 11.7–15.4)
WBC: 8.7 10*3/uL (ref 3.4–10.8)

## 2021-12-10 ENCOUNTER — Encounter: Payer: Self-pay | Admitting: Cardiology

## 2021-12-10 ENCOUNTER — Ambulatory Visit (INDEPENDENT_AMBULATORY_CARE_PROVIDER_SITE_OTHER): Payer: 59 | Admitting: Cardiology

## 2021-12-10 DIAGNOSIS — I5022 Chronic systolic (congestive) heart failure: Secondary | ICD-10-CM

## 2021-12-10 DIAGNOSIS — O903 Peripartum cardiomyopathy: Secondary | ICD-10-CM | POA: Diagnosis not present

## 2021-12-10 DIAGNOSIS — I10 Essential (primary) hypertension: Secondary | ICD-10-CM | POA: Diagnosis not present

## 2021-12-10 NOTE — Progress Notes (Signed)
Cardiology Office Note:    Date:  12/10/2021   ID:  Meredith Velez, DOB 1986/02/15, MRN [id number]  PCP:  Dorothyann Peng, MD   [facility] Providers Cardiologist:  Donato Schultz, MD     Referring MD: Dorothyann Peng, MD    History of Present Illness:    Meredith Velez is a 36 y.o. female here for follow-up peripartum cardiomyopathy hypertension anxiety depression.  Hospitalized in 2020.  Initial EF 20 to 25%.  Entresto Toprol placed.  Repeat echocardiogram 45%.  Patient understood that while pregnant could not take Entresto.  She had tubal ligation with her last pregnancy.  She was previously quite depressed about her cardiac situation.  [name] with social work helped her significantly, life coaching, patient care on etc. she participated with a [place] care team education video  Blood pressure under excellent control currently.  Her daughter runs track, she will try to walk the track more often. Work has been busy.  She is doing okay.  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   Seasonal allergies    Ulcer, stomach peptic    UTI (lower urinary tract infection)    Wears glasses     Past Surgical History:  Procedure Laterality Date   COLONOSCOPY     LAPAROSCOPIC BILATERAL SALPINGECTOMY Bilateral 01/04/2019   Procedure: LAPAROSCOPIC BILATERAL SALPINGECTOMY;  Surgeon: Osborn Coho, MD;  [facility];  Service: Gynecology;  Laterality: Bilateral;    Current Medications: Current Meds  Medication Sig   Cholecalciferol (VITAMIN D3) 50 MCG (2000 UT) TABS Take 1 tablet by mouth daily at 12 noon.   furosemide (LASIX) 20 MG tablet Take 1 tablet (20 mg total) by mouth daily.   ipratropium (ATROVENT) 0.03 % nasal spray as needed.   levocetirizine  (XYZAL) 5 MG tablet Take 5 mg by mouth daily as needed.   metoprolol succinate (TOPROL-XL) 100 MG 24 hr tablet Take 1 tablet (100 mg total) by mouth daily. Take with or immediately following a meal.   sacubitril-valsartan (ENTRESTO) 49-51 MG Take 1 tablet by mouth 2 (two) times daily.   spironolactone (ALDACTONE) 25 MG tablet Take 0.5 tablets (12.5 mg total) by mouth daily.   tinidazole (TINDAMAX) 500 MG tablet Take 500 mg by mouth 2 (two) times daily as needed.   valACYclovir (VALTREX) 1000 MG tablet Take 1 tablet by mouth as needed.     Allergies:   Patient has no known allergies.   Social History   Socioeconomic History   Marital status: Married    Spouse name: Not on file   Number of children: Not on file   Years of education: Not on file   Highest education level: Not on file  Occupational History   Occupation: Wellsite geologist: [employer]  Tobacco Use   Smoking status: Never   Smokeless tobacco: Never  Vaping Use   Vaping Use: Never used  Substance and Sexual Activity   Alcohol use: Yes    Comment: occ   Drug use: No   Sexual activity: Yes    Birth control/protection: None  Other Topics Concern   Not on file  Social History Narrative   Not on file   Social Determinants of Health  Financial Resource Strain: Not on file  Food Insecurity: Not on file  Transportation Needs: Not on file  Physical Activity: Not on file  Stress: Not on file  Social Connections: Not on file     Family History: The patient's family history includes Alzheimer's disease in her paternal grandmother; Breast cancer in her maternal aunt; Diabetes in her father and mother; Heart disease in her mother; Hyperlipidemia in her mother; Hypertension in her brother, father, and mother; Kidney disease in her mother; Lung cancer in her maternal uncle; Prostate cancer in her maternal grandfather and maternal grandmother; Stroke in her father.  ROS:   Please see the history of present  illness.     All other systems reviewed and are negative.  EKGs/Labs/Other Studies Reviewed:    The following studies were reviewed today: Echocardiogram 02/14/2019:   1. The left ventricle has severely reduced systolic function, with an  ejection fraction of 20-25%. The cavity size was severely dilated. Left  ventricular diastolic Doppler parameters are consistent with  pseudonormalization. Elevated left ventricular  end-diastolic pressure.   2. The right ventricle has normal systolic function. The cavity was  normal. There is no increase in right ventricular wall thickness.   3. Mild thickening of the mitral valve leaflet. Mild calcification of the  mitral valve leaflet.   4. The aortic valve is tricuspid. Mild thickening of the aortic valve.  Mild calcification of the aortic valve.   5. The aorta is normal in size and structure.   6. The interatrial septum was not well visualized.   Echocardiogram 04/07/2019:   Left ventricular ejection fraction, by visual estimation, is 40 to 45%. The left ventricle has moderately decreased function. Mildly increased left ventricular size. There is no left ventricular hypertrophy. 2. Apical window is foreshortened some, making evaluation difficult Compared to echo images from Aug 2020, LVEF appears improved. 3. Global right ventricle has normal systolic function.The right ventricular size is normal. No increase in right ventricular wall thickness   Recent Labs: 09/22/2021: ALT 15; BUN 14; Creatinine, Ser 1.01; Hemoglobin 13.5; Platelets 345; Potassium 4.7; Sodium 138  Recent Lipid Panel    Component Value Date/Time   CHOL 144 09/22/2021 1037   TRIG 74 09/22/2021 1037   HDL 44 09/22/2021 1037   CHOLHDL 3.3 09/22/2021 1037   CHOLHDL 3 05/11/2014 0937   VLDL 5.8 05/11/2014 0937   LDLCALC 85 09/22/2021 1037     Risk Assessment/Calculations:              Physical Exam:    VS:  BP 120/80 (BP Location: Left Arm, Patient Position:  Sitting, Cuff Size: Normal)   Pulse 80   Ht 5\' 2"  (1.575 m)   Wt 184 lb (83.5 kg)   SpO2 98%   BMI 33.65 kg/m     Wt Readings from Last 3 Encounters:  12/10/21 184 lb (83.5 kg)  09/22/21 182 lb (82.6 kg)  05/05/21 176 lb 9.6 oz (80.1 kg)     GEN:  Well nourished, well developed in no acute distress HEENT: Normal NECK: No JVD; No carotid bruits LYMPHATICS: No lymphadenopathy CARDIAC: RRR, no murmurs, no rubs, gallops RESPIRATORY:  Clear to auscultation without rales, wheezing or rhonchi  ABDOMEN: Soft, non-tender, non-distended MUSCULOSKELETAL:  No edema; No deformity  SKIN: Warm and dry NEUROLOGIC:  Alert and oriented x 3 PSYCHIATRIC:  Normal affect   ASSESSMENT:    1. Chronic systolic heart failure ([place])   2. Essential hypertension   3. Postpartum cardiomyopathy  PLAN:    In order of problems listed above:  Chronic systolic heart failure (HCC) -Initial LVEF noted to be 20% in 2020 with repeat echocardiogram 04/07/2019 with improvement at 40 to 45%. Last echo 02/2020 with EF at 35-40% with G1 DD.  -Continue mid dose Entresto  -Obtain BMET today  -Continue on, spironolactone, Lasix and Toprol  I am fine with her taking Lasix as needed.  Essential hypertension Excellent control currently 120/80.  No changes made.  Postpartum cardiomyopathy Marked improvement in ejection fraction.  Continue with current medications.         Medication Adjustments/Labs and Tests Ordered: Current medicines are reviewed at length with the patient today.  Concerns regarding medicines are outlined above.  No orders of the defined types were placed in this encounter.  No orders of the defined types were placed in this encounter.   Patient Instructions  Medication Instructions:  Your physician recommends that you continue on your current medications as directed. Please refer to the Current Medication list given to you today.  *If you need a refill on your cardiac medications  before your next appointment, please call your pharmacy*  Lab Work: NONE  Testing/Procedures: NONE  Follow-Up: At [facility], you and your health needs are our priority.  As part of our continuing mission to provide you with exceptional heart care, we have created designated Provider Care Teams.  These Care Teams include your primary Cardiologist (physician) and Advanced Practice Providers (APPs -  Physician Assistants and Nurse Practitioners) who all work together to provide you with the care you need, when you need it.  Your next appointment:   6 month(s)  The format for your next appointment:   In Person  Provider:   Robbie Lis, PA-C or Ambrose Pancoast, NP        Important Information About Sugar         Signed, Candee Furbish, MD  12/10/2021 2:35 PM    [place]

## 2021-12-10 NOTE — Assessment & Plan Note (Signed)
Marked improvement in ejection fraction.  Continue with current medications.

## 2021-12-10 NOTE — Assessment & Plan Note (Signed)
-  Initial LVEF noted to be 20% in 2020 with repeat echocardiogram 04/07/2019 with improvement at 40 to 45%. Last echo 02/2020 with EF at 35-40% with G1 DD.  -Continue mid dose Entresto  -Obtain BMET today  -Continue on, spironolactone, Lasix and Toprol  I am fine with her taking Lasix as needed.

## 2021-12-10 NOTE — Patient Instructions (Signed)
Medication Instructions:  Your physician recommends that you continue on your current medications as directed. Please refer to the Current Medication list given to you today.  *If you need a refill on your cardiac medications before your next appointment, please call your pharmacy*  Lab Work: NONE  Testing/Procedures: NONE  Follow-Up: At [facility], you and your health needs are our priority.  As part of our continuing mission to provide you with exceptional heart care, we have created designated Provider Care Teams.  These Care Teams include your primary Cardiologist (physician) and Advanced Practice Providers (APPs -  Physician Assistants and Nurse Practitioners) who all work together to provide you with the care you need, when you need it.  Your next appointment:   6 month(s)  The format for your next appointment:   In Person  Provider:   Chelsea Aus, PA-C or Robin Searing, NP        Important Information About Sugar

## 2021-12-10 NOTE — Assessment & Plan Note (Signed)
Excellent control currently 120/80.  No changes made.

## 2022-03-25 ENCOUNTER — Ambulatory Visit: Payer: 59 | Admitting: Internal Medicine

## 2022-06-25 ENCOUNTER — Other Ambulatory Visit: Payer: Self-pay

## 2022-06-25 ENCOUNTER — Other Ambulatory Visit: Payer: Self-pay | Admitting: Cardiology

## 2022-06-25 MED ORDER — METOPROLOL SUCCINATE ER 100 MG PO TB24
100.0000 mg | ORAL_TABLET | Freq: Every day | ORAL | 1 refills | Status: DC
Start: 2022-06-25 — End: 2023-02-12

## 2022-06-25 NOTE — Telephone Encounter (Signed)
Pt's medication was sent to pt's pharmacy as requested. Confirmation received.  °

## 2022-08-11 ENCOUNTER — Ambulatory Visit: Payer: 59 | Attending: Cardiology | Admitting: Cardiology

## 2022-08-11 ENCOUNTER — Encounter: Payer: Self-pay | Admitting: Cardiology

## 2022-08-11 VITALS — BP 142/100 | HR 111 | Ht 62.0 in | Wt 180.6 lb

## 2022-08-11 DIAGNOSIS — I5022 Chronic systolic (congestive) heart failure: Secondary | ICD-10-CM | POA: Diagnosis not present

## 2022-08-11 DIAGNOSIS — Z79899 Other long term (current) drug therapy: Secondary | ICD-10-CM

## 2022-08-11 DIAGNOSIS — Z8759 Personal history of other complications of pregnancy, childbirth and the puerperium: Secondary | ICD-10-CM

## 2022-08-11 DIAGNOSIS — I10 Essential (primary) hypertension: Secondary | ICD-10-CM

## 2022-08-11 DIAGNOSIS — O903 Peripartum cardiomyopathy: Secondary | ICD-10-CM

## 2022-08-11 NOTE — Progress Notes (Signed)
Cardiology Office Note:    Date:  08/11/2022   ID:  Meredith Velez, DOB [birth year], MRN [id number]  PCP:  Glendale Chard, MD   [facility] Providers Cardiologist:  Candee Furbish, MD     Referring MD: Glendale Chard, MD    History of Present Illness:    Meredith Velez is a 37 y.o. female here for follow-up peripartum cardiomyopathy hypertension anxiety depression.  Hospitalized in 2020.  Initial EF 20 to 25%.  Entresto Toprol placed.  Repeat echocardiogram 45%.  Patient understood that while pregnant could not take Entresto.  She had tubal ligation with her last pregnancy.  She was previously quite depressed about her cardiac situation.  Raquel Sarna with social work helped her significantly, life coaching, patient care on etc. she participated with a [place] care team education video.  Blood pressure under excellent control currently.  Her daughter runs track, she will try to walk the track more often. Work has been busy.  She is doing okay.    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   Seasonal allergies    Ulcer, stomach peptic    UTI (lower urinary tract infection)    Wears glasses     Past Surgical History:  Procedure Laterality Date   COLONOSCOPY     LAPAROSCOPIC BILATERAL SALPINGECTOMY Bilateral 01/04/2019   Procedure: LAPAROSCOPIC BILATERAL SALPINGECTOMY;  Surgeon: Everett Graff, MD;  Location: [facility];  Service: Gynecology;  Laterality: Bilateral;    Current Medications: Current Meds  Medication Sig   Cholecalciferol (VITAMIN D3) 50 MCG (2000 UT) TABS Take 1 tablet by mouth daily at 12 noon.   furosemide (LASIX) 20 MG tablet Take 1 tablet (20 mg total) by mouth daily.   ipratropium (ATROVENT) 0.03 % nasal spray as needed.   levocetirizine  (XYZAL) 5 MG tablet Take 5 mg by mouth daily as needed.   metoprolol succinate (TOPROL-XL) 100 MG 24 hr tablet Take 1 tablet (100 mg total) by mouth daily. TAKE WITH OR IMMEDIATELY FOLLOWING A MEAL.   sacubitril-valsartan (ENTRESTO) 49-51 MG Take 1 tablet by mouth 2 (two) times daily.   spironolactone (ALDACTONE) 25 MG tablet Take 0.5 tablets (12.5 mg total) by mouth daily.   valACYclovir (VALTREX) 1000 MG tablet Take 1 tablet by mouth as needed.     Allergies:   Patient has no known allergies.   Social History   Socioeconomic History   Marital status: Married    Spouse name: Not on file   Number of children: Not on file   Years of education: Not on file   Highest education level: Not on file  Occupational History   Occupation: Building surveyor: [employer]  Tobacco Use   Smoking status: Never   Smokeless tobacco: Never  Vaping Use   Vaping Use: Never used  Substance and Sexual Activity   Alcohol use: Yes    Comment: occ   Drug use: No   Sexual activity: Yes    Birth control/protection: None  Other Topics Concern   Not on file  Social History Narrative   Not on file   Social Determinants of Health   Financial Resource Strain: Not on file  Food Insecurity: No Food Insecurity (04/02/2020)   Hunger  Vital Sign    Worried About Programme researcher, broadcasting/film/video in the Last Year: Never true    Ran Out of Food in the Last Year: Never true  Transportation Needs: No Transportation Needs (04/02/2020)   PRAPARE - Administrator, Civil Service (Medical): No    Lack of Transportation (Non-Medical): No  Physical Activity: Not on file  Stress: Stress Concern Present (04/02/2020)   [employer] of Occupational Health - Occupational Stress Questionnaire    Feeling of Stress : Very much  Social Connections: Not on file     Family History: The patient's family history includes Alzheimer's disease in her paternal grandmother; Breast cancer in her maternal aunt;  Diabetes in her father and mother; Heart disease in her mother; Hyperlipidemia in her mother; Hypertension in her brother, father, and mother; Kidney disease in her mother; Lung cancer in her maternal uncle; Prostate cancer in her maternal grandfather and maternal grandmother; Stroke in her father.  ROS:   Please see the history of present illness.     All other systems reviewed and are negative.  EKGs/Labs/Other Studies Reviewed:    The following studies were reviewed today: Echocardiogram 02/14/2019:   1. The left ventricle has severely reduced systolic function, with an  ejection fraction of 20-25%. The cavity size was severely dilated. Left  ventricular diastolic Doppler parameters are consistent with  pseudonormalization. Elevated left ventricular  end-diastolic pressure.   2. The right ventricle has normal systolic function. The cavity was  normal. There is no increase in right ventricular wall thickness.   3. Mild thickening of the mitral valve leaflet. Mild calcification of the  mitral valve leaflet.   4. The aortic valve is tricuspid. Mild thickening of the aortic valve.  Mild calcification of the aortic valve.   5. The aorta is normal in size and structure.   6. The interatrial septum was not well visualized.   Echocardiogram 04/07/2019:   Left ventricular ejection fraction, by visual estimation, is 40 to 45%. The left ventricle has moderately decreased function. Mildly increased left ventricular size. There is no left ventricular hypertrophy. 2. Apical window is foreshortened some, making evaluation difficult Compared to echo images from Aug 2020, LVEF appears improved. 3. Global right ventricle has normal systolic function.The right ventricular size is normal. No increase in right ventricular wall thickness  06/24/21 ECHO:   1. Left ventricular ejection fraction, by estimation, is 40 to 45%. The  left ventricle has mildly decreased function. The left ventricle   demonstrates global hypokinesis. Left ventricular diastolic parameters are  consistent with Grade I diastolic  dysfunction (impaired relaxation).   2. Right ventricular systolic function is normal. The right ventricular  size is normal.   3. The mitral valve is normal in structure. Trivial mitral valve  regurgitation. No evidence of mitral stenosis.   4. The aortic valve is normal in structure. Aortic valve regurgitation is  not visualized. No aortic stenosis is present.   5. The inferior vena cava is normal in size with greater than 50%  respiratory variability, suggesting right atrial pressure of 3 mmHg.    Recent Labs: 09/22/2021: ALT 15; BUN 14; Creatinine, Ser 1.01; Hemoglobin 13.5; Platelets 345; Potassium 4.7; Sodium 138  Recent Lipid Panel    Component Value Date/Time   CHOL 144 09/22/2021 1037   TRIG 74 09/22/2021 1037   HDL 44 09/22/2021 1037   CHOLHDL 3.3 09/22/2021 1037   CHOLHDL 3 05/11/2014 0937   VLDL 5.8 05/11/2014 0937  [place] 85 09/22/2021 1037     Risk Assessment/Calculations:              Physical Exam:    VS:  BP (!) 142/100   Pulse (!) 111   Ht 5\' 2"  (1.575 m)   Wt 180 lb 9.6 oz (81.9 kg)   SpO2 98%   BMI 33.03 kg/m     Wt Readings from Last 3 Encounters:  08/11/22 180 lb 9.6 oz (81.9 kg)  12/10/21 184 lb (83.5 kg)  09/22/21 182 lb (82.6 kg)     GEN:  Well nourished, well developed in no acute distress HEENT: Normal NECK: No JVD; No carotid bruits LYMPHATICS: No lymphadenopathy CARDIAC: Tachy reg, no murmurs, no rubs, gallops RESPIRATORY:  Clear to auscultation without rales, wheezing or rhonchi  ABDOMEN: Soft, non-tender, non-distended MUSCULOSKELETAL:  No edema; No deformity  SKIN: Warm and dry NEUROLOGIC:  Alert and oriented x 3 PSYCHIATRIC:  Normal affect   ASSESSMENT:    1. Chronic systolic heart failure ([place])   2. Essential hypertension   3. Medication management   4. Postpartum cardiomyopathy     PLAN:    In  order of problems listed above:   Chronic systolic heart failure ([place]) -Initial LVEF noted to be 20% in 2020 with repeat echocardiogram 04/07/2019 with improvement at 40 to 45%. Last echo 02/2020 with EF at 35-40% with G1 DD.  -Continue mid dose Entresto  -Obtain BMET today  -Continue on, spironolactone, Lasix and Toprol.  Heart rate is elevated today.  She does admit that sometimes she does miss her medications.  Encourage continued usage to help promote ongoing heart health.  I am fine with her taking Lasix as needed.  Essential hypertension Previously well-controlled 120/80.  Currently elevated.  Continue with current medication strategy.  No changes made.  Postpartum cardiomyopathy Marked improvement in ejection fraction.  Continue with current medications.       Medication Adjustments/Labs and Tests Ordered: Current medicines are reviewed at length with the patient today.  Concerns regarding medicines are outlined above.  Orders Placed This Encounter  Procedures   Basic metabolic panel   No orders of the defined types were placed in this encounter.   Patient Instructions  Medication Instructions:  The current medical regimen is effective;  continue present plan and medications.  *If you need a refill on your cardiac medications before your next appointment, please call your pharmacy*   Lab Work: Please have blood work today (BMP)  If you have labs (blood work) drawn today and your tests are completely normal, you will receive your results only by: MyChart Message (if you have MyChart) OR A paper copy in the mail If you have any lab test that is abnormal or we need to change your treatment, we will call you to review the results.  Follow-Up: At [facility], you and your health needs are our priority.  As part of our continuing mission to provide you with exceptional heart care, we have created designated Provider Care Teams.  These Care Teams include your  primary Cardiologist (physician) and Advanced Practice Providers (APPs -  Physician Assistants and Nurse Practitioners) who all work together to provide you with the care you need, when you need it.  We recommend signing up for the patient portal called "MyChart".  Sign up information is provided on this After Visit Summary.  MyChart is used to connect with patients for Virtual Visits (Telemedicine).  Patients are able to view lab/test results,  encounter notes, upcoming appointments, etc.  Non-urgent messages can be sent to your provider as well.   To learn more about what you can do with MyChart, go to [email or web address].    Your next appointment:   6 month(s)  Provider:   Nicholes Rough, PA-C, Ermalinda Barrios, PA-C, Christen Bame, NP, or Richardson Dopp, PA-C     Then, Candee Furbish, MD will plan to see you again in 1 year(s).       Signed, Candee Furbish, MD  08/11/2022 4:55 PM    [facility]

## 2022-08-11 NOTE — Patient Instructions (Signed)
Medication Instructions:  The current medical regimen is effective;  continue present plan and medications.  *If you need a refill on your cardiac medications before your next appointment, please call your pharmacy*   Lab Work: Please have blood work today (BMP)  If you have labs (blood work) drawn today and your tests are completely normal, you will receive your results only by: MyChart Message (if you have MyChart) OR A paper copy in the mail If you have any lab test that is abnormal or we need to change your treatment, we will call you to review the results.  Follow-Up: At [facility], you and your health needs are our priority.  As part of our continuing mission to provide you with exceptional heart care, we have created designated Provider Care Teams.  These Care Teams include your primary Cardiologist (physician) and Advanced Practice Providers (APPs -  Physician Assistants and Nurse Practitioners) who all work together to provide you with the care you need, when you need it.  We recommend signing up for the patient portal called "MyChart".  Sign up information is provided on this After Visit Summary.  MyChart is used to connect with patients for Virtual Visits (Telemedicine).  Patients are able to view lab/test results, encounter notes, upcoming appointments, etc.  Non-urgent messages can be sent to your provider as well.   To learn more about what you can do with MyChart, go to [email or web address].    Your next appointment:   6 month(s)  Provider:   Nicholes Rough, PA-C, Ermalinda Barrios, PA-C, Christen Bame, NP, or Richardson Dopp, PA-C     Then, Candee Furbish, MD will plan to see you again in 1 year(s).

## 2022-08-12 LAB — BASIC METABOLIC PANEL
BUN/Creatinine Ratio: 11 (ref 9–23)
BUN: 10 mg/dL (ref 6–20)
CO2: 25 mmol/L (ref 20–29)
Calcium: 9.8 mg/dL (ref 8.7–10.2)
Chloride: 102 mmol/L (ref 96–106)
Creatinine, Ser: 0.95 mg/dL (ref 0.57–1.00)
Glucose: 107 mg/dL — ABNORMAL HIGH (ref 70–99)
Potassium: 4 mmol/L (ref 3.5–5.2)
Sodium: 140 mmol/L (ref 134–144)
eGFR: 79 mL/min/{1.73_m2} (ref >59)

## 2022-08-13 ENCOUNTER — Encounter: Payer: Self-pay | Admitting: *Deleted

## 2022-08-25 ENCOUNTER — Ambulatory Visit: Payer: 59 | Admitting: Internal Medicine

## 2022-08-25 ENCOUNTER — Encounter: Payer: Self-pay | Admitting: Internal Medicine

## 2022-08-25 VITALS — BP 140/90 | HR 60 | Temp 98.0°F | Ht 62.0 in | Wt 184.0 lb

## 2022-08-25 DIAGNOSIS — I5089 Other heart failure: Secondary | ICD-10-CM

## 2022-08-25 DIAGNOSIS — L299 Pruritus, unspecified: Secondary | ICD-10-CM

## 2022-08-25 DIAGNOSIS — Z2821 Immunization not carried out because of patient refusal: Secondary | ICD-10-CM

## 2022-08-25 DIAGNOSIS — L309 Dermatitis, unspecified: Secondary | ICD-10-CM | POA: Diagnosis not present

## 2022-08-25 DIAGNOSIS — I5022 Chronic systolic (congestive) heart failure: Secondary | ICD-10-CM | POA: Diagnosis not present

## 2022-08-25 DIAGNOSIS — O903 Peripartum cardiomyopathy: Secondary | ICD-10-CM

## 2022-08-25 DIAGNOSIS — I110 Hypertensive heart disease with heart failure: Secondary | ICD-10-CM | POA: Diagnosis not present

## 2022-08-25 MED ORDER — TRIAMCINOLONE ACETONIDE 0.1 % EX CREA
TOPICAL_CREAM | CUTANEOUS | 0 refills | Status: DC
Start: 2022-08-25 — End: 2024-05-02

## 2022-08-25 MED ORDER — TRIAMCINOLONE ACETONIDE 0.1 % EX CREA: TOPICAL_CREAM | CUTANEOUS | Status: DC

## 2022-08-25 NOTE — Progress Notes (Addendum)
Barnet Glasgow Martin,acting as a Education administrator for Maximino Greenland, MD.,have documented all relevant documentation on the behalf of Maximino Greenland, MD,as directed by  Maximino Greenland, MD while in the presence of Maximino Greenland, MD.    Subjective:     Patient ID: Meredith Velez , female    DOB: 09/30/1985 , 37 y.o.   MRN: [id number]   Chief Complaint  Patient presents with   Urticaria    HPI  Pt presents today for hives. Patient first broke out with hives a month ago and she took zyrtec and it went away. She states it has came back and is worse now she states the zyrtec isn't working anymore. She states when she goes it outside it flares more. Patient states the hives are on her legs , shoulders, arms and face.  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   Seasonal allergies    Ulcer, stomach peptic    UTI (lower urinary tract infection)    Wears glasses      Family History  Problem Relation Age of Onset   Hypertension Mother    Hyperlipidemia Mother    Heart disease Mother    Kidney disease Mother    Diabetes Mother    Hypertension Father    Stroke Father    Diabetes Father    Hypertension Brother    Breast cancer Maternal Aunt    Lung cancer Maternal Uncle    Prostate cancer Maternal Grandmother    Prostate cancer Maternal Grandfather    Alzheimer's disease Paternal Grandmother      Current Outpatient Medications:    Cholecalciferol (VITAMIN D3) 50 MCG (2000 UT) TABS, Take 1 tablet by mouth daily at 12 noon., Disp: , Rfl:    furosemide (LASIX) 20 MG tablet, Take 1 tablet (20 mg total) by mouth daily., Disp: 90 tablet, Rfl: 2   ipratropium (ATROVENT) 0.03 % nasal  spray, as needed., Disp: , Rfl:    levocetirizine (XYZAL) 5 MG tablet, Take 5 mg by mouth daily as needed., Disp: , Rfl:    metoprolol succinate (TOPROL-XL) 100 MG 24 hr tablet, Take 1 tablet (100 mg total) by mouth daily. TAKE WITH OR IMMEDIATELY FOLLOWING A MEAL., Disp: 90 tablet, Rfl: 1   sacubitril-valsartan (ENTRESTO) 49-51 MG, Take 1 tablet by mouth 2 (two) times daily., Disp: 180 tablet, Rfl: 2   spironolactone (ALDACTONE) 25 MG tablet, Take 0.5 tablets (12.5 mg total) by mouth daily., Disp: 45 tablet, Rfl: 2   valACYclovir (VALTREX) 1000 MG tablet, Take 1 tablet by mouth as needed., Disp: , Rfl:    clobetasol ointment (TEMOVATE) [id number] %, Apply 1 Application topically 2 (two) times daily. Apply to body BID for two weeks on then two weeks off, Disp: 30 g, Rfl: 0   tacrolimus (PROTOPIC) 0.1 % ointment, Apply topically 2 (two) times daily. Apply to FACE BID until clear, Disp: 100 g, Rfl: 0   tinidazole (TINDAMAX) 500 MG tablet, Take 500 mg by mouth 2 (two) times daily as needed. (Patient not taking: Reported on 08/11/2022), Disp: , Rfl:    triamcinolone cream (KENALOG)  0.1 %, APPLY TO AFFECTED AREA TWICE DAILY AS NEEDED, Disp: 45 g, Rfl: 0   No Known Allergies   Review of Systems  Constitutional: Negative.   Respiratory: Negative.    Cardiovascular: Negative.   Gastrointestinal: Negative.   Skin:  Positive for rash.  Neurological: Negative.   Psychiatric/Behavioral: Negative.       Today's Vitals   08/25/22 1439 08/25/22 1525  BP: (!) 140/82 (!) 140/90  Pulse: 60   Temp: 98 F (36.7 C)   TempSrc: Oral   Weight: 184 lb (83.5 kg)   Height: 5\' 2"  (1.575 m)   PainSc: 0-No pain    Body mass index is 33.65 kg/m.  Wt Readings from Last 3 Encounters:  08/25/22 184 lb (83.5 kg)  08/11/22 180 lb 9.6 oz (81.9 kg)  12/10/21 184 lb (83.5 kg)    Objective:  Physical Exam Vitals and nursing note reviewed.  Constitutional:      Appearance: Normal appearance. She is obese.  HENT:      Head: Normocephalic and atraumatic.     Nose:     Comments: Masked     Mouth/Throat:     Comments: Masked  Eyes:     Extraocular Movements: Extraocular movements intact.  Cardiovascular:     Rate and Rhythm: Normal rate and regular rhythm.     Heart sounds: Normal heart sounds.  Pulmonary:     Effort: Pulmonary effort is normal.     Breath sounds: Normal breath sounds.  Musculoskeletal:     Cervical back: Normal range of motion.  Skin:    General: Skin is warm.     Comments: Scattered areas of erythematous papules on upr body. No vesicular lesions noted. No urticarial lesions noted.   Neurological:     General: No focal deficit present.     Mental Status: She is alert.  Psychiatric:        Mood and Affect: Mood normal.        Behavior: Behavior normal.      Assessment And Plan:     1. Dermatitis Comments: Exacerbated by hot showers. I will check labs as below and refer her to Derm for further evaluation. I will send rx triamcinolone cream to use sparingly prn. - Ambulatory referral to Dermatology - FANA Staining Patterns  2. Pruritus Comments: I will check labs as below. She may benefit from Allergy evaluation. - ANA, IFA (with reflex) - Liver Profile - CBC no Diff  3. Hypertensive heart disease with other congestive heart failure (HCC) Comments: Chronic, fair control. She will c/w spironolactone, metoprolol, and furosemide daily for now. Reminded to follow low sodium diet.  4. Chronic systolic heart failure ([place]) Comments: Chronic, EF 40-45% in Dec 2022. Importance of dietary/medication compliance was d/w patient in full detail.  5. Immunization declined   Patient was given opportunity to ask questions. Patient verbalized understanding of the plan and was able to repeat key elements of the plan. All questions were answered to their satisfaction.   I, Maximino Greenland, MD, have reviewed all documentation for this visit. The documentation on 08/27/22 for the exam,  diagnosis, procedures, and orders are all accurate and complete.   IF YOU HAVE BEEN REFERRED TO A SPECIALIST, IT MAY TAKE 1-2 WEEKS TO SCHEDULE/PROCESS THE REFERRAL. IF YOU HAVE NOT HEARD FROM US/SPECIALIST IN TWO WEEKS, PLEASE GIVE [place] A CALL AT [phone] X [phone].   THE PATIENT IS ENCOURAGED TO PRACTICE SOCIAL DISTANCING DUE TO THE COVID-19 PANDEMIC.

## 2022-08-25 NOTE — Patient Instructions (Addendum)
Chicken bone broth  Hives Hives are itchy, red, swollen areas on your skin. Hives can show up on any part of your body. Hives often fade within 24 hours (acute hives). New hives can show up after old ones fade. This can go on for many days or weeks (chronic hives). Hives do not spread from person to person (are not contagious). Hives are caused by your body's response to something that you are allergic to (allergen). These are sometimes called triggers. You can get hives right after being around a trigger, or hours later. What are the causes? Allergies to foods. Insect bites or stings. Exposure to pollen or pets. Spending time in sunlight, heat, or cold. Exercise. Stress. You can also get hives from other medical conditions and treatments, such as: Some medicines. Chemicals or latex. Viruses. This includes the common cold. Infections caused by germs (bacteria). Allergy shots. Blood transfusions. Sometimes, the cause is not known. What increases the risk? Being a woman. Being allergic to foods such as: Citrus fruits. Milk. Eggs. Peanuts. Tree nuts. Shellfish. Being allergic to: Medicines. Latex. Insects. Animals. Pollen. What are the signs or symptoms?  Raised, itchy, red or white bumps or patches on your skin. These areas may: Get large and swollen. Change in shape and location. Stand alone or connect to each other over a large area of skin. Sting or hurt. Turn white when pressed in the center (blanch). In very bad cases, your hands, feet, and face may also get swollen. This may happen if hives start deeper in your skin. How is this treated? Treatment for this condition depends on your symptoms. Treatment may include: Using cool, wet cloths (cool compresses) or taking cool showers to stop the itching. Medicines that help: Relieve itching (antihistamines). Reduce swelling (corticosteroids). Treat infection (antibiotics). A medicine (omalizumab) that is given as a  shot (injection). Your doctor may prescribe this if you have hives that do not get better even after other treatments. In very bad cases, you may need a shot of a medicine called epinephrine to prevent a life-threatening allergic reaction (anaphylaxis). Follow these instructions at home: Medicines Take or apply over-the-counter and prescription medicines only as told by your doctor. If you were prescribed an antibiotic medicine, use it as told by your doctor. Do not stop using it even if you start to feel better. Skin care Apply cool, wet cloths to the hives. Do not scratch your skin. Do not rub your skin. General instructions Do not take hot showers or baths. This can make itching worse. Do not wear tight clothes. Use sunscreen and wear clothes that cover your skin when you are outside. Avoid any triggers that cause your hives. Keep a journal to help track what causes your hives. Write down: What medicines you take. What you eat and drink. What products you use on your skin. Keep all follow-up visits as told by your doctor. This is important. Contact a doctor if: Your symptoms are not better with medicine. Your joints hurt or are swollen. Get help right away if: You have a fever. You have pain in your belly (abdomen). Your tongue or lips are swollen. Your eyelids are swollen. Your chest or throat feels tight. You have trouble breathing or swallowing. These symptoms may be an emergency. Do not wait to see if the symptoms will go away. Get medical help right away. Call your local emergency services (911 in the [place] Do not drive yourself to the hospital. Summary Hives are itchy, red, swollen areas  on your skin. Treatment for this condition depends on your symptoms. Avoid things that cause your hives. Keep a journal to help track what causes your hives. Take and apply over-the-counter and prescription medicines only as told by your doctor. Get help right away if your chest or throat  feels tight or if you have trouble breathing or swallowing. This information is not intended to replace advice given to you by your health care provider. Make sure you discuss any questions you have with your health care provider. Document Revised: 08/09/2020 Document Reviewed: 08/11/2020 Elsevier Patient Education  [employer].

## 2022-08-27 LAB — HEPATIC FUNCTION PANEL
ALT: 24 IU/L (ref 0–32)
AST: 24 IU/L (ref 0–40)
Albumin: 4.9 g/dL (ref 3.9–4.9)
Alkaline Phosphatase: 90 IU/L (ref 44–121)
Bilirubin Total: 0.7 mg/dL (ref 0.0–1.2)
Bilirubin, Direct: 0.17 mg/dL (ref 0.00–0.40)
Total Protein: 7.9 g/dL (ref 6.0–8.5)

## 2022-08-27 LAB — CBC
Hematocrit: 39.1 % (ref 34.0–46.6)
Hemoglobin: 12.7 g/dL (ref 11.1–15.9)
MCH: 29.3 pg (ref 26.6–33.0)
MCHC: 32.5 g/dL (ref 31.5–35.7)
MCV: 90 fL (ref 79–97)
Platelets: 377 10*3/uL (ref 150–450)
RBC: 4.34 x10E6/uL (ref 3.77–5.28)
RDW: 13 % (ref 11.7–15.4)
WBC: 10.1 10*3/uL (ref 3.4–10.8)

## 2022-08-27 LAB — FANA STAINING PATTERNS: Speckled Pattern: 1:160 {titer} — ABNORMAL HIGH

## 2022-08-27 LAB — ANTINUCLEAR ANTIBODIES, IFA: ANA Titer 1: POSITIVE — AB

## 2022-08-27 NOTE — Progress Notes (Incomplete)
Barnet Glasgow Martin,acting as a Education administrator for Maximino Greenland, MD.,have documented all relevant documentation on the behalf of Maximino Greenland, MD,as directed by  Maximino Greenland, MD while in the presence of Maximino Greenland, MD.    Subjective:     Patient ID: Meredith Velez , female    DOB: 1985/09/11 , 37 y.o.   MRN: [id number]   Chief Complaint  Patient presents with  . Urticaria    HPI  Pt presents today for hives. Patient first broke out with hives a month ago and she took zyrtec and it went away. She states it has came back and is worse now she states the zyrtec isn't working anymore. She states when she goes it outside it flares more. Patient states the hives are on her legs , shoulders, arms and face.  BP Readings from Last 3 Encounters: 08/25/22 : (!) 140/82 08/11/22 : (!) 142/100 12/10/21 : 120/80    Urticaria     Past Medical History:  Diagnosis Date  . Anemia   . Anxiety   . Depression    pp depression  . Frequent headaches   . GERD (gastroesophageal reflux disease)   . Hypertension   . Migraines   . 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 Seasonal allergies   . Ulcer, stomach peptic   . UTI (lower urinary tract infection)   . Wears glasses      Family History  Problem Relation Age of Onset  . Hypertension Mother   . Hyperlipidemia Mother   . Heart disease Mother   . Kidney disease Mother   . Diabetes Mother   . Hypertension Father   . Stroke Father   . Diabetes Father   . Hypertension Brother   . Breast cancer Maternal Aunt   . Lung cancer Maternal Uncle   . Prostate cancer Maternal Grandmother   . Prostate cancer Maternal Grandfather   . Alzheimer's disease Paternal Grandmother      Current Outpatient Medications:  .  Cholecalciferol (VITAMIN D3) 50 MCG (2000 UT) TABS, Take 1 tablet by mouth daily at 12 noon., Disp: , Rfl:  .  furosemide (LASIX) 20 MG tablet, Take 1 tablet (20 mg total) by mouth daily., Disp: 90 tablet, Rfl: 2 .   ipratropium (ATROVENT) 0.03 % nasal spray, as needed., Disp: , Rfl:  .  levocetirizine (XYZAL) 5 MG tablet, Take 5 mg by mouth daily as needed., Disp: , Rfl:  .  metoprolol succinate (TOPROL-XL) 100 MG 24 hr tablet, Take 1 tablet (100 mg total) by mouth daily. TAKE WITH OR IMMEDIATELY FOLLOWING A MEAL., Disp: 90 tablet, Rfl: 1 .  sacubitril-valsartan (ENTRESTO) 49-51 MG, Take 1 tablet by mouth 2 (two) times daily., Disp: 180 tablet, Rfl: 2 .  spironolactone (ALDACTONE) 25 MG tablet, Take 0.5 tablets (12.5 mg total) by mouth daily., Disp: 45 tablet, Rfl: 2 .  valACYclovir (VALTREX) 1000 MG tablet, Take 1 tablet by mouth as needed., Disp: , Rfl:  .  tinidazole (TINDAMAX) 500 MG tablet, Take 500 mg by mouth 2 (two) times daily as needed. (Patient not taking: Reported on 08/11/2022), Disp: , Rfl:  .  triamcinolone cream (KENALOG) 0.1 %, APPLY TO AFFECTED AREA TWICE DAILY AS NEEDED, Disp: 45 g, Rfl: 0   No Known Allergies   Review of Systems  Constitutional: Negative.   Respiratory: Negative.    Cardiovascular: Negative.   Skin:  Positive for rash.  Neurological: Negative.   Psychiatric/Behavioral: Negative.  Today's Vitals   08/25/22 1439 08/25/22 1525  BP: (!) 140/82 (!) 140/90  Pulse: 60   Temp: 98 F (36.7 C)   TempSrc: Oral   Weight: 184 lb (83.5 kg)   Height: 5' 2"$  (1.575 m)   PainSc: 0-No pain    Body mass index is 33.65 kg/m.  Wt Readings from Last 3 Encounters:  08/25/22 184 lb (83.5 kg)  08/11/22 180 lb 9.6 oz (81.9 kg)  12/10/21 184 lb (83.5 kg)    Objective:  Physical Exam Vitals and nursing note reviewed.  Constitutional:      Appearance: Normal appearance. She is obese.  HENT:     Head: Normocephalic and atraumatic.     Nose:     Comments: Masked     Mouth/Throat:     Comments: Masked  Eyes:     Extraocular Movements: Extraocular movements intact.  Cardiovascular:     Rate and Rhythm: Normal rate and regular rhythm.     Heart sounds: Normal heart  sounds.  Pulmonary:     Effort: Pulmonary effort is normal.     Breath sounds: Normal breath sounds.  Musculoskeletal:     Cervical back: Normal range of motion.  Skin:    General: Skin is warm.  Neurological:     General: No focal deficit present.     Mental Status: She is alert.  Psychiatric:        Mood and Affect: Mood normal.        Behavior: Behavior normal.      Assessment And Plan:     1. Dermatitis Comments: Exacerbated by hot showers. I will check labs as below and refer her to Derm for further evaluation. - Ambulatory referral to Dermatology  2. Pruritus - ANA, IFA (with reflex) - Liver Profile - CBC no Diff  3. Hypertensive heart disease with other congestive heart failure ([place])  4. Postpartum cardiomyopathy Comments: Importance of dietary/medication compliance was d/w patient in full detail.  5. Immunization declined   Patient was given opportunity to ask questions. Patient verbalized understanding of the plan and was able to repeat key elements of the plan. All questions were answered to their satisfaction.   I, Maximino Greenland, MD, have reviewed all documentation for this visit. The documentation on 08/27/22 for the exam, diagnosis, procedures, and orders are all accurate and complete.   IF YOU HAVE BEEN REFERRED TO A SPECIALIST, IT MAY TAKE 1-2 WEEKS TO SCHEDULE/PROCESS THE REFERRAL. IF YOU HAVE NOT HEARD FROM US/SPECIALIST IN TWO WEEKS, PLEASE GIVE [place] A CALL AT [phone].   THE PATIENT IS ENCOURAGED TO PRACTICE SOCIAL DISTANCING DUE TO THE COVID-19 PANDEMIC.

## 2022-09-16 ENCOUNTER — Encounter: Payer: Self-pay | Admitting: Dermatology

## 2022-09-16 ENCOUNTER — Ambulatory Visit: Payer: 59 | Admitting: Dermatology

## 2022-09-16 DIAGNOSIS — L409 Psoriasis, unspecified: Secondary | ICD-10-CM | POA: Diagnosis not present

## 2022-09-16 DIAGNOSIS — L299 Pruritus, unspecified: Secondary | ICD-10-CM | POA: Diagnosis not present

## 2022-09-16 MED ORDER — CLOBETASOL PROPIONATE 0.05 % EX OINT: 1.0000 | TOPICAL_OINTMENT | Freq: Two times a day (BID) | CUTANEOUS | 0 refills | Status: DC

## 2022-09-16 MED ORDER — TACROLIMUS 0.1 % EX OINT: TOPICAL_OINTMENT | Freq: Two times a day (BID) | CUTANEOUS | 0 refills | Status: DC

## 2022-09-16 NOTE — Patient Instructions (Signed)
FACE:  ** Apply Protopic (Tacrolimus) ointment twice a day to affected areas on face until clear   BODY **  Apply clobetasol Ointment twice a day for 2 weeks then stop.  After 2 weeks switch to Protopic (Tacrolimus) Ointment twice a day for 2 weeks.  Keep alternating Clobetasol 2 weeks then Tacrolimus 2 weeks until next appointment.

## 2022-09-16 NOTE — Progress Notes (Signed)
   Follow-Up Visit   Subjective  Meredith Velez is a 37 y.o. female who presents for the following: Dermatitis (Using Eucerin. Breaking out on arms, legs, stomach. Sometimes the itching unbearable. Today is an okay day. About a 4 our of a 10. Sun light and warm rooms make it flare. PCP gave triamcinolone. Did not work. Benadryl didn't work. Dove sensitive skin soap. Using regular laundry detergent. Started a few months ago.//Pt has hx of dry easily irritated skin in the past but that would resolve with topical OTC moisuturizers.  Admits to recent URI).    The following portions of the chart were reviewed this encounter and updated as appropriate:  Tobacco  Allergies  Meds  Problems  Med Hx  Surg Hx  Fam Hx      Review of Systems: No other skin or systemic complaints.  Objective  Well appearing patient in no apparent distress; mood and affect are within normal limits.  A focused examination was performed including arms, legs, and face. Relevant physical exam findings are noted in the Assessment and Plan.  Left Abdomen (side) - Lower, Left Forearm - Anterior, Left Lower Leg - Anterior, Left Upper Arm - Anterior, Right Abdomen (side) - Lower, Right Forearm - Anterior, Right Lower Leg - Anterior, Right Upper Arm - Anterior Well-demarcated erythematous papules/plaques with silvery scale, guttate pink scaly papules.   Left Upper Arm - Anterior, Right Abdomen (side) - Upper, Right Upper Arm - Anterior Patient exhibits excessive active pruritus during exam. Active excoriations    Assessment & Plan  Psoriasis Left Upper Arm - Anterior; Right Upper Arm - Anterior; Left Forearm - Anterior; Right Forearm - Anterior; Left Lower Leg - Anterior; Right Lower Leg - Anterior; Left Abdomen (side) - Lower; Right Abdomen (side) - Lower    Treatment Plan: Apply Protopic to the FACE twice/day until symptoms clear  Apply clobetasol to the body for twice/day for two weeks on then two weeks  off  Counseling on psoriasis and coordination of care  psoriasis is a chronic non-curable, but treatable genetic/hereditary disease that may have other systemic features affecting other organ systems such as joints (Psoriatic Arthritis). It is associated with an increased risk of inflammatory bowel disease, heart disease, non-alcoholic fatty liver disease, and depression.  Treatments include light and laser treatments; topical medications; and systemic medications including oral and injectables.     Related Medications clobetasol ointment (TEMOVATE) 4.62 % Apply 1 Application topically 2 (two) times daily. Apply to body BID for two weeks on then two weeks off  tacrolimus (PROTOPIC) 0.1 % ointment Apply topically 2 (two) times daily. Apply to FACE BID until clear  Pruritus (3) Left Upper Arm - Anterior; Right Upper Arm - Anterior; Right Abdomen (side) - Upper  I counseled the patient regarding the following: Skin care: Recommend moisturizers, anti-histamines and sarna lotion. Expectations: Pruritus can be intermittent or persistent. Persistent cases can be due to dry skin, mite infestations, allergic reactions, neurodermatoses, or organic disease. For pruritus lasting longer than several months, CBC, TSH, LFTs, BUN/Crt, C-Xray may be warranted. Contact office if: Pruritus is accompanied by constitutional symptoms, or persists despite several months of treatment.    Return in about 6 weeks (around 10/28/2022) for Dermatitis Follow up.

## 2022-09-28 ENCOUNTER — Encounter: Payer: Self-pay | Admitting: Pharmacist

## 2022-10-08 ENCOUNTER — Encounter: Payer: Self-pay | Admitting: Internal Medicine

## 2022-10-08 ENCOUNTER — Ambulatory Visit (INDEPENDENT_AMBULATORY_CARE_PROVIDER_SITE_OTHER): Payer: 59 | Admitting: Internal Medicine

## 2022-10-08 VITALS — BP 136/82 | HR 85 | Temp 98.3°F | Ht 62.0 in | Wt 182.4 lb

## 2022-10-08 DIAGNOSIS — E6609 Other obesity due to excess calories: Secondary | ICD-10-CM

## 2022-10-08 DIAGNOSIS — I5089 Other heart failure: Secondary | ICD-10-CM | POA: Diagnosis not present

## 2022-10-08 DIAGNOSIS — Z0000 Encounter for general adult medical examination without abnormal findings: Secondary | ICD-10-CM

## 2022-10-08 DIAGNOSIS — Z6833 Body mass index (BMI) 33.0-33.9, adult: Secondary | ICD-10-CM

## 2022-10-08 DIAGNOSIS — I5022 Chronic systolic (congestive) heart failure: Secondary | ICD-10-CM | POA: Diagnosis not present

## 2022-10-08 DIAGNOSIS — I110 Hypertensive heart disease with heart failure: Secondary | ICD-10-CM

## 2022-10-08 LAB — POCT URINALYSIS DIPSTICK
Bilirubin, UA: NEGATIVE
Blood, UA: NEGATIVE
Glucose, UA: NEGATIVE
Ketones, UA: NEGATIVE
Leukocytes, UA: NEGATIVE
Nitrite, UA: NEGATIVE
Protein, UA: NEGATIVE
Spec Grav, UA: >=1.03 — AB (ref 1.010–1.025)
Urobilinogen, UA: NEGATIVE E.U./dL — AB
pH, UA: 5.5 (ref 5.0–8.0)

## 2022-10-08 MED ORDER — FUROSEMIDE 20 MG PO TABS: 20.0000 mg | ORAL_TABLET | Freq: Every day | ORAL | 2 refills | Status: DC

## 2022-10-08 NOTE — Addendum Note (Signed)
Addended by: Maximino Greenland on: 10/08/2022 11:14 PM   Modules accepted: Level of Service

## 2022-10-08 NOTE — Progress Notes (Signed)
Barnet Glasgow Martin,acting as a Education administrator for Maximino Greenland, MD.,have documented all relevant documentation on the behalf of Maximino Greenland, MD,as directed by  Maximino Greenland, MD while in the presence of Maximino Greenland, MD.   Subjective:     Patient ID: Meredith Velez , female    DOB: [birth year] , 37 y.o.   MRN: [id number]   Chief Complaint  Patient presents with   Annual Exam    HPI  Patient presents today for HM, she is followed by Dr. Mancel Bale for GYN care. She reports compliance with medication and has no other concerns today. She states she has been feeling great and her energy levels have improved. Additionally, she has been evaluated by Derm and her skin issues have improved w/ treatment.   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   Seasonal allergies    Ulcer, stomach peptic    UTI (lower urinary tract infection)    Wears glasses      Family History  Problem Relation Age of Onset   Hypertension Mother    Hyperlipidemia Mother    Heart disease Mother    Kidney disease Mother    Diabetes Mother    Hypertension Father    Stroke Father    Diabetes Father    Hypertension Brother    Breast cancer Maternal Aunt    Lung cancer Maternal Uncle    Prostate cancer Maternal Grandmother    Prostate cancer Maternal Grandfather    Alzheimer's disease Paternal Grandmother      Current Outpatient Medications:    Cholecalciferol (VITAMIN D3) 50 MCG (2000 UT) TABS, Take 1 tablet by mouth daily at 12 noon., Disp: , Rfl:    clobetasol ointment (TEMOVATE) [id number] %, Apply 1 Application topically 2 (two) times daily. Apply to body BID for two weeks on then two weeks off, Disp: 30 g,  Rfl: 0   ipratropium (ATROVENT) 0.03 % nasal spray, as needed., Disp: , Rfl:    levocetirizine (XYZAL) 5 MG tablet, Take 5 mg by mouth daily as needed., Disp: , Rfl:    metoprolol succinate (TOPROL-XL) 100 MG 24 hr tablet, Take 1 tablet (100 mg total) by mouth daily. TAKE WITH OR IMMEDIATELY FOLLOWING A MEAL., Disp: 90 tablet, Rfl: 1   sacubitril-valsartan (ENTRESTO) 49-51 MG, Take 1 tablet by mouth 2 (two) times daily., Disp: 180 tablet, Rfl: 2   spironolactone (ALDACTONE) 25 MG tablet, Take 0.5 tablets (12.5 mg total) by mouth daily., Disp: 45 tablet, Rfl: 2   triamcinolone cream (KENALOG) 0.1 %, APPLY TO AFFECTED AREA TWICE DAILY AS NEEDED, Disp: 45 g, Rfl: 0   valACYclovir (VALTREX) 1000 MG tablet, Take 1 tablet by mouth as needed., Disp: , Rfl:    furosemide (LASIX) 20 MG tablet, Take 1 tablet (20 mg total) by mouth daily., Disp: 90 tablet, Rfl: 2   tacrolimus (PROTOPIC) 0.1 % ointment, Apply topically 2 (two) times daily. Apply to FACE BID until clear (Patient not taking: Reported on 10/08/2022), Disp: 100 g, Rfl: 0   tinidazole (TINDAMAX) 500 MG tablet, Take 500 mg by mouth 2 (two) times  daily as needed. (Patient not taking: Reported on 08/11/2022), Disp: , Rfl:    No Known Allergies    The patient states she uses  s/p salpingectomy  for birth control. Last LMP was No LMP recorded.. Negative for Dysmenorrhea. Negative for: breast discharge, breast lump(s), breast pain and breast self exam. Associated symptoms include abnormal vaginal bleeding. Pertinent negatives include abnormal bleeding (hematology), anxiety, decreased libido, depression, difficulty falling sleep, dyspareunia, history of infertility, nocturia, sexual dysfunction, sleep disturbances, urinary incontinence, urinary urgency, vaginal discharge and vaginal itching. Diet regular.The patient states her exercise level is  intermittent.  . The patient's tobacco use is:  Social History   Tobacco Use  Smoking Status Never  Smokeless  Tobacco Never  . She has been exposed to passive smoke. The patient's alcohol use is:  Social History   Substance and Sexual Activity  Alcohol Use Yes   Comment: occ    Review of Systems  Constitutional: Negative.   HENT: Negative.    Eyes: Negative.   Respiratory: Negative.    Cardiovascular: Negative.   Gastrointestinal: Negative.   Endocrine: Negative.   Genitourinary: Negative.   Musculoskeletal: Negative.   Skin: Negative.   Allergic/Immunologic: Negative.   Neurological: Negative.   Hematological: Negative.   Psychiatric/Behavioral: Negative.       Today's Vitals   10/08/22 0842  BP: 136/82  Pulse: 85  Temp: 98.3 F (36.8 C)  TempSrc: Oral  Weight: 182 lb 6.4 oz (82.7 kg)  Height: 5\' 2"  (1.575 m)  PainSc: 0-No pain   Body mass index is 33.36 kg/m.  Wt Readings from Last 3 Encounters:  10/08/22 182 lb 6.4 oz (82.7 kg)  08/25/22 184 lb (83.5 kg)  08/11/22 180 lb 9.6 oz (81.9 kg)    Objective:  Physical Exam Constitutional:      General: She is not in acute distress.    Appearance: Normal appearance. She is well-developed. She is obese.  HENT:     Head: Normocephalic and atraumatic.     Right Ear: Hearing, tympanic membrane, ear canal and external ear normal. There is no impacted cerumen.     Left Ear: Hearing, tympanic membrane, ear canal and external ear normal. There is no impacted cerumen.     Nose:     Comments: Deferred - masked    Mouth/Throat:     Comments: Deferred - masked Eyes:     General: Lids are normal.     Extraocular Movements: Extraocular movements intact.     Conjunctiva/sclera: Conjunctivae normal.     Pupils: Pupils are equal, round, and reactive to light.     Funduscopic exam:    Right eye: No papilledema.        Left eye: No papilledema.  Neck:     Thyroid: No thyroid mass.     Vascular: No carotid bruit.  Cardiovascular:     Rate and Rhythm: Normal rate and regular rhythm.     Pulses: Normal pulses.     Heart sounds:  Normal heart sounds. No murmur heard. Pulmonary:     Effort: Pulmonary effort is normal.     Breath sounds: Normal breath sounds.  Chest:     Chest wall: No mass.  Breasts:    Tanner Score is 5.     Right: Normal. No mass or tenderness.     Left: Normal. No mass or tenderness.  Abdominal:     General: Abdomen is flat. Bowel sounds are normal. There is no distension.  Palpations: Abdomen is soft.     Tenderness: There is no abdominal tenderness.  Genitourinary:    Rectum: Guaiac result negative.  Musculoskeletal:        General: No swelling. Normal range of motion.     Cervical back: Full passive range of motion without pain, normal range of motion and neck supple.     Right lower leg: No edema.     Left lower leg: No edema.  Lymphadenopathy:     Upper Body:     Right upper body: No supraclavicular, axillary or pectoral adenopathy.     Left upper body: No supraclavicular, axillary or pectoral adenopathy.  Skin:    General: Skin is warm and dry.     Capillary Refill: Capillary refill takes less than 2 seconds.     Comments: Scattered areas of hypopigmentation on upr arms b/l  Neurological:     General: No focal deficit present.     Mental Status: She is alert and oriented to person, place, and time.     Cranial Nerves: No cranial nerve deficit.     Sensory: No sensory deficit.  Psychiatric:        Mood and Affect: Mood normal.        Behavior: Behavior normal.        Thought Content: Thought content normal.        Judgment: Judgment normal.      Assessment And Plan:     1. Annual physical exam Comments: A full exam was performed. Importance of monthly self breast exams was stressed to the patient. She is UTD w/ cervical CA screening. PATIENT IS ADVISED TO GET 30-45 MINUTES REGULAR EXERCISE NO LESS THAN FOUR TO FIVE DAYS PER WEEK - BOTH WEIGHTBEARING EXERCISES AND AEROBIC ARE RECOMMENDED.  PATIENT IS ADVISED TO FOLLOW A HEALTHY DIET WITH AT LEAST SIX FRUITS/VEGGIES PER  DAY, DECREASE INTAKE OF RED MEAT, AND TO INCREASE FISH INTAKE TO TWO DAYS PER WEEK.  MEATS/FISH SHOULD NOT BE FRIED, BAKED OR BROILED IS PREFERABLE.  IT IS ALSO IMPORTANT TO CUT BACK ON YOUR SUGAR INTAKE. PLEASE AVOID ANYTHING WITH ADDED SUGAR, CORN SYRUP OR OTHER SWEETENERS. IF YOU MUST USE A SWEETENER, YOU CAN TRY STEVIA. IT IS ALSO IMPORTANT TO AVOID ARTIFICIALLY SWEETENERS AND DIET BEVERAGES. LASTLY, I SUGGEST WEARING SPF 50 SUNSCREEN ON EXPOSED PARTS AND ESPECIALLY WHEN IN THE DIRECT SUNLIGHT FOR AN EXTENDED PERIOD OF TIME.  PLEASE AVOID FAST FOOD RESTAURANTS AND INCREASE YOUR WATER INTAKE. - Lipid panel - CBC - CMP14+EGFR - TSH  2. Hypertensive heart disease with other congestive heart failure Comments: Chronic, well controlled.  EKG performed, NSR w/o acute changes. She will c/w Entresto, furosemide, spironolactone and metoprolol XL 100mg  daily. She agrees to RTO in 4-6 months for re-evaluation.  - EKG 12-Lead - Microalbumin / creatinine urine ratio - POCT urinalysis dipstick - furosemide (LASIX) 20 MG tablet; Take 1 tablet (20 mg total) by mouth daily.  Dispense: 90 tablet; Refill: 2  3. Chronic systolic heart failure Comments: Chronic, importance of salt restriction and dietary/medication compliance was d/w patient. Last echo Dec 2022, EF 40-45%. Cardiology input appreciated.  4. Class 1 obesity due to excess calories with serious comorbidity and body mass index (BMI) of 33.0 to 33.9 in adult Comments: She is encouraged to aim for at least 150 minutes of exercise/week, while initially striving for BMI<30 to decrease cardiac risk.   Patient was given opportunity to ask questions. Patient verbalized understanding of the plan and was  able to repeat key elements of the plan. All questions were answered to their satisfaction.   I, Maximino Greenland, MD, have reviewed all documentation for this visit. The documentation on 10/08/22 for the exam, diagnosis, procedures, and orders are all  accurate and complete.   THE PATIENT IS ENCOURAGED TO PRACTICE SOCIAL DISTANCING DUE TO THE COVID-19 PANDEMIC.

## 2022-10-08 NOTE — Patient Instructions (Signed)

## 2022-10-09 LAB — CMP14+EGFR
ALT: 14 IU/L (ref 0–32)
AST: 18 IU/L (ref 0–40)
Albumin/Globulin Ratio: 1.4 (ref 1.2–2.2)
Albumin: 4.5 g/dL (ref 3.9–4.9)
Alkaline Phosphatase: 82 IU/L (ref 44–121)
BUN/Creatinine Ratio: 12 (ref 9–23)
BUN: 12 mg/dL (ref 6–20)
Bilirubin Total: 0.4 mg/dL (ref 0.0–1.2)
CO2: 20 mmol/L (ref 20–29)
Calcium: 9.9 mg/dL (ref 8.7–10.2)
Chloride: 104 mmol/L (ref 96–106)
Creatinine, Ser: 1.01 mg/dL — ABNORMAL HIGH (ref 0.57–1.00)
Globulin, Total: 3.2 g/dL (ref 1.5–4.5)
Glucose: 84 mg/dL (ref 70–99)
Potassium: 4.6 mmol/L (ref 3.5–5.2)
Sodium: 140 mmol/L (ref 134–144)
Total Protein: 7.7 g/dL (ref 6.0–8.5)
eGFR: 73 mL/min/{1.73_m2} (ref >59)

## 2022-10-09 LAB — LIPID PANEL
Chol/HDL Ratio: 3.2 ratio (ref 0.0–4.4)
Cholesterol, Total: 129 mg/dL (ref 100–199)
HDL: 40 mg/dL (ref >39)
LDL Chol Calc (NIH): 78 mg/dL (ref 0–99)
Triglycerides: 51 mg/dL (ref 0–149)
VLDL Cholesterol Cal: 11 mg/dL (ref 5–40)

## 2022-10-09 LAB — MICROALBUMIN / CREATININE URINE RATIO
Creatinine, Urine: 143.9 mg/dL
Microalb/Creat Ratio: 6 mg/g creat (ref 0–29)
Microalbumin, Urine: 8.6 ug/mL

## 2022-10-09 LAB — CBC
Hematocrit: 40.5 % (ref 34.0–46.6)
Hemoglobin: 12.9 g/dL (ref 11.1–15.9)
MCH: 29.2 pg (ref 26.6–33.0)
MCHC: 31.9 g/dL (ref 31.5–35.7)
MCV: 92 fL (ref 79–97)
Platelets: 354 10*3/uL (ref 150–450)
RBC: 4.42 x10E6/uL (ref 3.77–5.28)
RDW: 13.1 % (ref 11.7–15.4)
WBC: 9.1 10*3/uL (ref 3.4–10.8)

## 2022-10-09 LAB — TSH: TSH: 2.68 u[IU]/mL (ref 0.450–4.500)

## 2022-10-28 ENCOUNTER — Ambulatory Visit: Payer: 59 | Admitting: Dermatology

## 2022-10-28 ENCOUNTER — Encounter: Payer: Self-pay | Admitting: Dermatology

## 2022-10-28 VITALS — BP 167/108

## 2022-10-28 DIAGNOSIS — L409 Psoriasis, unspecified: Secondary | ICD-10-CM

## 2022-10-28 DIAGNOSIS — L810 Postinflammatory hyperpigmentation: Secondary | ICD-10-CM | POA: Diagnosis not present

## 2022-10-28 DIAGNOSIS — L819 Disorder of pigmentation, unspecified: Secondary | ICD-10-CM

## 2022-10-28 MED ORDER — TACROLIMUS 0.1 % EX OINT
TOPICAL_OINTMENT | Freq: Two times a day (BID) | CUTANEOUS | 2 refills | Status: AC
Start: 2022-10-28 — End: ?

## 2022-10-28 MED ORDER — CLOBETASOL PROPIONATE 0.05 % EX OINT
1.0000 | TOPICAL_OINTMENT | Freq: Two times a day (BID) | CUTANEOUS | 2 refills | Status: AC
Start: 2022-10-28 — End: ?

## 2022-10-28 NOTE — Patient Instructions (Signed)
Due to recent changes in healthcare laws, you may see results of your pathology and/or laboratory studies on MyChart before the doctors have had a chance to review them. We understand that in some cases there may be results that are confusing or concerning to you. Please understand that not all results are received at the same time and often the doctors may need to interpret multiple results in order to provide you with the best plan of care or course of treatment. Therefore, we ask that you please give us 2 business days to thoroughly review all your results before contacting the office for clarification. Should we see a critical lab result, you will be contacted sooner.   If You Need Anything After Your Visit  If you have any questions or concerns for your doctor, please call our main line at [phone] If no one answers, please leave a voicemail as directed and we will return your call as soon as possible. Messages left after 4 pm will be answered the following business day.   You may also send us a message via MyChart. We typically respond to MyChart messages within 1-2 business days.  For prescription refills, please ask your pharmacy to contact our office. Our fax number is [phone].  If you have an urgent issue when the clinic is closed that cannot wait until the next business day, you can page your doctor at the number below.    Please note that while we do our best to be available for urgent issues outside of office hours, we are not available 24/7.   If you have an urgent issue and are unable to reach us, you may choose to seek medical care at your doctor's office, retail clinic, urgent care center, or emergency room.  If you have a medical emergency, please immediately call 911 or go to the emergency department. In the event of inclement weather, please call our main line at [phone] for an update on the status of any delays or closures.  Dermatology Medication Tips: Please  keep the boxes that topical medications come in in order to help keep track of the instructions about where and how to use these. Pharmacies typically print the medication instructions only on the boxes and not directly on the medication tubes.   If your medication is too expensive, please contact our office at [phone] or send us a message through MyChart.   We are unable to tell what your co-pay for medications will be in advance as this is different depending on your insurance coverage. However, we may be able to find a substitute medication at lower cost or fill out paperwork to get insurance to cover a needed medication.   If a prior authorization is required to get your medication covered by your insurance company, please allow us 1-2 business days to complete this process.  Drug prices often vary depending on where the prescription is filled and some pharmacies may offer cheaper prices.  The website www.goodrx.com contains coupons for medications through different pharmacies. The prices here do not account for what the cost may be with help from insurance (it may be cheaper with your insurance), but the website can give you the price if you did not use any insurance.  - You can print the associated coupon and take it with your prescription to the pharmacy.  - You may also stop by our office during regular business hours and pick up a GoodRx coupon card.  - If you need your   prescription sent electronically to a different pharmacy, notify our office through [facility] MyChart or by phone at [phone]     

## 2022-10-28 NOTE — Progress Notes (Signed)
   Follow-Up Visit   Subjective  Meredith Velez is a 37 y.o. female who presents for the following: Psoriasis. Improving. She says itch is a 3 on a scale of 1-10 and is periodic now. She used all of the Clobetasol on her arms / legs (only a 30gm tube was provided)  The lesions on her face resolved after applying tacrolimus ointment.       Treatment: Apply Protopic to the FACE twice/day until symptoms clear   Apply clobetasol to the body for twice/day for two weeks on then two weeks off  The following portions of the chart were reviewed this encounter and updated as appropriate: medications, allergies, medical history  Review of Systems:  No other skin or systemic complaints except as noted in HPI or Assessment and Plan.  Objective  Well appearing patient in no apparent distress; mood and affect are within normal limits.  Areas Examined:  Left Upper Arm - Anterior; Right Upper Arm - Anterior; Left Forearm - Anterior; Right Forearm - Anterior; Left Lower Leg - Anterior; Right Lower Leg - Anterior; Left Abdomen (side) - Lower; Right Abdomen (side) - Lower Less raised   Relevant exam findings are noted in the Assessment and Plan.      Assessment & Plan   Psoriasis  Related Medications clobetasol ointment (TEMOVATE) 0.05 % Apply 1 Application topically 2 (two) times daily. Apply to body BID for two weeks on then two weeks off  tacrolimus (PROTOPIC) 0.1 % ointment Apply topically 2 (two) times daily. Apply to face and affected areas on body twice daily for 2 weeks then stop. Restart in 2 weeks    PSORIASIS Well-demarcated erythematous papules/plaques with silvery scale, guttate pink scaly papules. 40% BSA.  PGA: 1  Improved, not at goal yet  Patient denies joint pain  Treatment Plan: Tacrolimus daily to body 2 weeks on 2 weeks off alternating with Clobetasol cream twice daily Add Cerave anti-itch lotion, use as a base to mix with clobetasol for spreading over  larger surface areas.  Recommended 20 minutes daily of sunlight Pt doing very well on topical regimen, we will table the conversation regarding biologics at the moment and revisit if things change in the future   Counseling on psoriasis and coordination of care  psoriasis is a chronic non-curable, but treatable genetic/hereditary disease that may have other systemic features affecting other organ systems such as joints (Psoriatic Arthritis). It is associated with an increased risk of inflammatory bowel disease, heart disease, non-alcoholic fatty liver disease, and depression.  Treatments include light and laser treatments; topical medications; and systemic medications including oral and injectables.   POST INFLAMMATORY HYPOPIGMENTATION Exam:  Hypopigmented macules in areas of prior inflammatory lesions  Treatment Plan: -reassured pt this is a normal sequela of inflammation in skin of color -pigment will gradually return -a few minutes of sunlight daily and topical tacrolimus will help    Return in about 3 months (around 01/27/2023) for Psoriasis Follow UP.    Documentation: I have reviewed the above documentation for accuracy and completeness, and I agree with the above.  Langston Reusing, MD

## 2023-01-18 ENCOUNTER — Encounter: Payer: Self-pay | Admitting: Nurse Practitioner

## 2023-01-18 ENCOUNTER — Ambulatory Visit: Payer: 59 | Admitting: Nurse Practitioner

## 2023-01-18 ENCOUNTER — Telehealth: Payer: Self-pay | Admitting: Cardiology

## 2023-01-18 VITALS — BP 120/90 | HR 71 | Temp 98.0°F | Ht 62.0 in | Wt 183.0 lb

## 2023-01-18 DIAGNOSIS — G43809 Other migraine, not intractable, without status migrainosus: Secondary | ICD-10-CM | POA: Diagnosis not present

## 2023-01-18 DIAGNOSIS — I110 Hypertensive heart disease with heart failure: Secondary | ICD-10-CM | POA: Diagnosis not present

## 2023-01-18 DIAGNOSIS — R0609 Other forms of dyspnea: Secondary | ICD-10-CM | POA: Diagnosis not present

## 2023-01-18 DIAGNOSIS — I5022 Chronic systolic (congestive) heart failure: Secondary | ICD-10-CM

## 2023-01-18 MED ORDER — UBRELVY 50 MG PO TABS
1.0000 | ORAL_TABLET | Freq: Every day | ORAL | Status: DC
Start: 2023-01-18 — End: 2023-12-03

## 2023-01-18 MED ORDER — TRIAMCINOLONE ACETONIDE 40 MG/ML IJ SUSP
60.0000 mg | Freq: Once | INTRAMUSCULAR | Status: AC
Start: 2023-01-18 — End: 2023-01-18
  Administered 2023-01-18: 60 mg via INTRAMUSCULAR

## 2023-01-18 NOTE — Telephone Encounter (Signed)
Patient is returning call. Requesting return call.  

## 2023-01-18 NOTE — Telephone Encounter (Signed)
Pts PCP office sent a secure chat about this pt, to see if Dr. Anne Fu had advised on the message where the pt called our office earlier today.  Informed the pts PCP that triage nursing was unsuccessful in reaching the pt earlier.  We called her number back but she did not answer and she had no voicemail set-up.   PCP said they are actually seeing the pt in their office for complaints right now.  Per [name] FNP when seeing the pt today, she felt that the pt is having a migraine so she was going to treat her for that and have the pt check her blood pressures at home.  Janece FNP is going to also check a BNP on the pt while she's in the office, due to pt having some dyspnea on exertion .    Informed Janece that Dr. Anne Fu and his RN are out of the office this week, but I will forward this helpful information to them as an FYI, when they return to the office.    PCP note is also visible in epic.

## 2023-01-18 NOTE — Progress Notes (Signed)
[name], CMA,acting as a Neurosurgeon for Meredith Felts, FNP.,have documented all relevant documentation on the behalf of Meredith Felts, FNP,as directed by  Meredith Felts, FNP while in the presence of Meredith Felts, FNP.  Subjective:  Patient ID: Meredith Velez , female    DOB: October 27, 1985 , 37 y.o.   MRN: [id number]  Chief Complaint  Patient presents with   Hypertension    HPI  Patient presents today due to her blood pressure spiking up. Patient reports this weekend her blood pressure was 170/119. Blood pressure was elevated on and off for 3 weeks. She can not think of any changes. She reports she has been having headaches and a little SOB. She reports she has been really tired as well, she reports she is restless due to her headaches. Patient reports having a little cold last week, she reports spitting up a lot of thick mucus. Patient reports her headache currently is only on her left side of her head.   She does have a previous history of migraines in the past and needed a steroid injection. She has light sensitivity. She also reports going through menopause and is scheduled to see GYN at the end of the month.   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   Seasonal allergies    Ulcer, stomach peptic    UTI (lower urinary tract infection)    Wears glasses      Family History  Problem Relation Age of Onset   Hypertension Mother    Hyperlipidemia Mother    Heart disease Mother    Kidney disease Mother    Diabetes Mother    Hypertension Father    Stroke Father    Diabetes Father    Hypertension Brother    Breast cancer Maternal Aunt    Lung cancer Maternal Uncle    Prostate cancer  Maternal Grandmother    Prostate cancer Maternal Grandfather    Alzheimer's disease Paternal Grandmother      Current Outpatient Medications:    Cholecalciferol (VITAMIN D3) 50 MCG (2000 UT) TABS, Take 1 tablet by mouth daily at 12 noon., Disp: , Rfl:    clobetasol ointment (TEMOVATE) 0.05 %, Apply 1 Application topically 2 (two) times daily. Apply to body BID for two weeks on then two weeks off, Disp: 60 g, Rfl: 2   furosemide (LASIX) 20 MG tablet, Take 1 tablet (20 mg total) by mouth daily., Disp: 90 tablet, Rfl: 2   ipratropium (ATROVENT) 0.03 % nasal spray, as needed., Disp: , Rfl:    levocetirizine (XYZAL) 5 MG tablet, Take 5 mg by mouth daily as needed., Disp: , Rfl:    metoprolol succinate (TOPROL-XL) 100 MG 24 hr tablet, Take 1 tablet (100 mg total) by mouth daily. TAKE WITH OR IMMEDIATELY FOLLOWING A MEAL., Disp: 90 tablet, Rfl: 1   sacubitril-valsartan (ENTRESTO) 49-51 MG, Take 1 tablet by mouth 2 (two) times daily., Disp: 180 tablet, Rfl: 2   spironolactone (ALDACTONE) 25 MG tablet, Take 0.5 tablets (12.5 mg total) by mouth daily., Disp: 45 tablet, Rfl: 2   tacrolimus (PROTOPIC) 0.1 % ointment, Apply topically 2 (two)  times daily. Apply to face and affected areas on body twice daily for 2 weeks then stop. Restart in 2 weeks, Disp: 100 g, Rfl: 2   triamcinolone cream (KENALOG) 0.1 %, APPLY TO AFFECTED AREA TWICE DAILY AS NEEDED, Disp: 45 g, Rfl: 0   Ubrogepant (UBRELVY) 50 MG TABS, Take 1 tablet (50 mg total) by mouth daily., Disp: 16 tablet, Rfl:    valACYclovir (VALTREX) 1000 MG tablet, Take 1 tablet by mouth as needed., Disp: , Rfl:    clobetasol (TEMOVATE) 0.05 % external solution, Apply 1 Application topically 2 (two) times daily. Apply twice daily as needed for flares, Disp: 50 mL, Rfl: 2   tinidazole (TINDAMAX) 500 MG tablet, Take 500 mg by mouth 2 (two) times daily as needed. (Patient not taking: Reported on 08/11/2022), Disp: , Rfl:    No Known Allergies   Review of Systems   Constitutional: Negative.   Respiratory: Negative.    Cardiovascular: Negative.   Neurological: Negative.   Psychiatric/Behavioral: Negative.       Today's Vitals   01/18/23 1511 01/18/23 1556  BP: (!) 140/100 (!) 120/90  Pulse: 71   Temp: 98 F (36.7 C)   TempSrc: Oral   Weight: 183 lb (83 kg)   Height: 5\' 2"  (1.575 m)   PainSc: 6    PainLoc: Head    Body mass index is 33.47 kg/m.  Wt Readings from Last 3 Encounters:  01/18/23 183 lb (83 kg)  10/08/22 182 lb 6.4 oz (82.7 kg)  08/25/22 184 lb (83.5 kg)      Objective:  Physical Exam Vitals reviewed.  Constitutional:      General: She is not in acute distress.    Appearance: Normal appearance.  Cardiovascular:     Rate and Rhythm: Normal rate and regular rhythm.     Pulses: Normal pulses.     Heart sounds: Normal heart sounds. No murmur heard. Pulmonary:     Effort: Pulmonary effort is normal. No respiratory distress.     Breath sounds: Normal breath sounds. No wheezing.  Neurological:     General: No focal deficit present.     Mental Status: She is alert and oriented to person, place, and time.     Cranial Nerves: No cranial nerve deficit.     Motor: No weakness.  Psychiatric:        Mood and Affect: Mood normal.        Behavior: Behavior normal.        Thought Content: Thought content normal.        Judgment: Judgment normal.         Assessment And Plan:  Hypertensive heart disease with chronic systolic congestive heart failure (HCC) Assessment & Plan: I am treating her headache as it seems this may be up due to having a migraine, she is to check her bp daily and provide readings  Orders: -     Brain natriuretic peptide  Dyspnea on exertion Assessment & Plan: Will check BNP due to history of CHF.   Orders: -     Brain natriuretic peptide  Other migraine without status migrainosus, not intractable Assessment & Plan: Given kenalog due to elevated bp and not able to get toradol.  Also given  Bernita Raisin as needed Will have MA to call and check on her  Orders: -     Triamcinolone Acetonide -     Bernita Raisin; Take 1 tablet (50 mg total) by mouth daily.  Dispense: 16 tablet  No follow-ups on file.  Patient was given opportunity to ask questions. Patient verbalized understanding of the plan and was able to repeat key elements of the plan. All questions were answered to their satisfaction.    Jeanell Sparrow, FNP, have reviewed all documentation for this visit. The documentation on 01/18/23 for the exam, diagnosis, procedures, and orders are all accurate and complete.   IF YOU HAVE BEEN REFERRED TO A SPECIALIST, IT MAY TAKE 1-2 WEEKS TO SCHEDULE/PROCESS THE REFERRAL. IF YOU HAVE NOT HEARD FROM US/SPECIALIST IN TWO WEEKS, PLEASE GIVE [place] A CALL AT [phone].

## 2023-01-18 NOTE — Telephone Encounter (Signed)
Pt c/o BP issue: STAT if pt c/o blurred vision, one-sided weakness or slurred speech  1. What are your last 5 BP readings?  179/118, 168/119, 144/98, 167/113  2. Are you having any other symptoms (ex. Dizziness, headache, blurred vision, passed out)? Headaches, lightheaded, fatigue, nauseaated and shortness of breath off and on  3. What is your BP issue? Patient wants to be seen

## 2023-01-18 NOTE — Telephone Encounter (Signed)
Triage made an attempt to contact pt.  Pt did not answer and voicemail is not set-up at this time.

## 2023-01-19 LAB — BRAIN NATRIURETIC PEPTIDE: BNP: 34.3 pg/mL (ref 0.0–100.0)

## 2023-01-26 NOTE — Telephone Encounter (Signed)
  Agree with PCP care of BP

## 2023-01-27 ENCOUNTER — Encounter: Payer: Self-pay | Admitting: Dermatology

## 2023-01-27 ENCOUNTER — Ambulatory Visit: Payer: 59 | Admitting: Dermatology

## 2023-01-27 VITALS — BP 132/87

## 2023-01-27 DIAGNOSIS — L409 Psoriasis, unspecified: Secondary | ICD-10-CM | POA: Diagnosis not present

## 2023-01-27 DIAGNOSIS — L819 Disorder of pigmentation, unspecified: Secondary | ICD-10-CM

## 2023-01-27 DIAGNOSIS — L810 Postinflammatory hyperpigmentation: Secondary | ICD-10-CM

## 2023-01-27 DIAGNOSIS — Z7189 Other specified counseling: Secondary | ICD-10-CM | POA: Diagnosis not present

## 2023-01-27 MED ORDER — CLOBETASOL PROPIONATE 0.05 % EX SOLN: 1.0000 | Freq: Two times a day (BID) | CUTANEOUS | 2 refills | Status: AC

## 2023-01-27 NOTE — Patient Instructions (Addendum)
Hi Meredith Velez,  Thank you for visiting [place] today. We appreciate your dedication to managing your health and are pleased to hear about the improvement in your body psoriasis.  Here is a summary of the key instructions from today's consultation:  - Continue using Tacrolimus alternating with Clobetasol as per your current routine for body psoriasis.  - For scalp psoriasis:   - Clobetasol drops: Apply twice a day for two weeks. If symptoms clear up earlier, you may stop using them.   - DHS Zinc Shampoo: Use once a week. Apply, let sit for three minutes, then rinse. Follow with your regular conditioner.   - Avoid applying oils directly to the scalp to prevent worsening dandruff and itching. If using oil treatments, apply to hair only and shampoo out after a few hours.  Please continue with your current hair care routine but consider using the DHS Zinc Shampoo during your salon visits as well.  We will schedule a follow-up appointment in six months to reassess your condition. However, if your symptoms worsen or become unmanageable, please contact us sooner.  Thank you once again for your visit today, and I wish you a wonderful rest of the summer.      Due to recent changes in healthcare laws, you may see results of your pathology and/or laboratory studies on MyChart before the doctors have had a chance to review them. We understand that in some cases there may be results that are confusing or concerning to you. Please understand that not all results are received at the same time and often the doctors may need to interpret multiple results in order to provide you with the best plan of care or course of treatment. Therefore, we ask that you please give [place] 2 business days to thoroughly review all your results before contacting the office for clarification. Should we see a critical lab result, you will be contacted sooner.   If You Need Anything After Your Visit  If you have any questions or concerns  for your doctor, please call our main line at [phone] If no one answers, please leave a voicemail as directed and we will return your call as soon as possible. Messages left after 4 pm will be answered the following business day.   You may also send [place] a message via MyChart. We typically respond to MyChart messages within 1-2 business days.  For prescription refills, please ask your pharmacy to contact our office. Our fax number is [phone].  If you have an urgent issue when the clinic is closed that cannot wait until the next business day, you can page your doctor at the number below.    Please note that while we do our best to be available for urgent issues outside of office hours, we are not available 24/7.   If you have an urgent issue and are unable to reach [place], you may choose to seek medical care at your doctor's office, retail clinic, urgent care center, or emergency room.  If you have a medical emergency, please immediately call 911 or go to the emergency department. In the event of inclement weather, please call our main line at [phone] for an update on the status of any delays or closures.  Dermatology Medication Tips: Please keep the boxes that topical medications come in in order to help keep track of the instructions about where and how to use these. Pharmacies typically print the medication instructions only on the boxes and not directly on the medication  tubes.   If your medication is too expensive, please contact our office at [phone] or send [place] a message through MyChart.   We are unable to tell what your co-pay for medications will be in advance as this is different depending on your insurance coverage. However, we may be able to find a substitute medication at lower cost or fill out paperwork to get insurance to cover a needed medication.   If a prior authorization is required to get your medication covered by your insurance company, please allow [place] 1-2  business days to complete this process.  Drug prices often vary depending on where the prescription is filled and some pharmacies may offer cheaper prices.  The website www.goodrx.com contains coupons for medications through different pharmacies. The prices here do not account for what the cost may be with help from insurance (it may be cheaper with your insurance), but the website can give you the price if you did not use any insurance.  - You can print the associated coupon and take it with your prescription to the pharmacy.  - You may also stop by our office during regular business hours and pick up a GoodRx coupon card.  - If you need your prescription sent electronically to a different pharmacy, notify our office through [facility] or by phone at [phone]

## 2023-01-27 NOTE — Progress Notes (Signed)
   Follow-Up Visit   Subjective  Meredith Velez is a 37 y.o. female who presents for the following: Psoriasis on the arms and legs. Improving. She says itch is a 0 on a scale of 1-10. She is using Tacrolimus ointment twice daily for 2 weeks alternating with Clobetasol cream. She also uses Cerave anti-itch lotion as needed. She has tried stopping all meds for 1 week and had some recurrence of symptoms. New issue today: She has itching and flaking at the hairline scalp periodically x 5 years. She is alternating with T gel shampoo and head and shoulders. She washes her hair every 1-2 weeks.      The following portions of the chart were reviewed this encounter and updated as appropriate: medications, allergies, medical history  Review of Systems:  No other skin or systemic complaints except as noted in HPI or Assessment and Plan.  Objective  Well appearing patient in no apparent distress; mood and affect are within normal limits.             Areas Examined: Scalp Left Upper Arm - Anterior; Right Upper Arm - Anterior; Left Forearm - Anterior; Right Forearm - Anterior; Left Lower Leg - Anterior; Right Lower Leg - Anterior; Left Abdomen (side) - Lower; Right Abdomen (side) - Lower   Relevant exam findings are noted in the Assessment and Plan.      Assessment & Plan      PSORIASIS Well-demarcated erythematous papules/plaques with silvery scale, guttate pink scaly papules. 40% BSA.  PGA: 1  Improved, not at goal yet  Patient denies joint pain  Treatment Plan: Continue Tacrolimus daily to body 2 weeks on 2 weeks off alternating with Clobetasol cream twice daily Add Cerave anti-itch lotion, use as a base to mix with clobetasol for spreading over larger surface areas.  Recommended 20 minutes daily of sunlight Clobetasol solution twice daily as needed DHS Zinc shampoo 1 x weekly When using oil treatments, allow it to sit on the scalp then wash out a few hours  later.   Counseling on psoriasis and coordination of care  psoriasis is a chronic non-curable, but treatable genetic/hereditary disease that may have other systemic features affecting other organ systems such as joints (Psoriatic Arthritis). It is associated with an increased risk of inflammatory bowel disease, heart disease, non-alcoholic fatty liver disease, and depression.  Treatments include light and laser treatments; topical medications; and systemic medications including oral and injectables.   POST INFLAMMATORY HYPOPIGMENTATION-Improved Exam:  Hypopigmented macules in areas of prior inflammatory lesions  Treatment Plan: -reassured pt this is a normal sequela of inflammation in skin of color -pigment will gradually return -a few minutes of sunlight daily and topical tacrolimus will help    Return in about 4 months (around 05/30/2023) for Psoriasis Follow UP.  Jaclynn Guarneri, CMA, am acting as scribe for [employer], DO.   Documentation: I have reviewed the above documentation for accuracy and completeness, and I agree with the above.  Langston Reusing, DO

## 2023-02-01 NOTE — Assessment & Plan Note (Addendum)
Given kenalog due to elevated bp and not able to get toradol.  Also given Ubrelvy as needed Will have MA to call and check on her

## 2023-02-01 NOTE — Assessment & Plan Note (Signed)
I am treating her headache as it seems this may be up due to having a migraine, she is to check her bp daily and provide readings

## 2023-02-01 NOTE — Assessment & Plan Note (Signed)
Will check BNP due to history of CHF.

## 2023-02-08 ENCOUNTER — Ambulatory Visit: Payer: 59 | Admitting: Internal Medicine

## 2023-02-08 NOTE — Progress Notes (Deleted)
I, T Deloria Lair, CMA,acting as a Neurosurgeon for [name], MD.,have documented all relevant documentation on the behalf of [name], MD,as directed by  [name], MD while in the presence of [name], MD.  Subjective:  Patient ID: Meredith Velez , female    DOB: 1985/08/27 , 37 y.o.   MRN: [id number]  No chief complaint on file.   HPI  Patient presents today for blood pressure check. She reports compliance with medications. Denies headache, chest pain, SOB.     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   Seasonal allergies    Ulcer, stomach peptic    UTI (lower urinary tract infection)    Wears glasses      Family History  Problem Relation Age of Onset   Hypertension Mother    Hyperlipidemia Mother    Heart disease Mother    Kidney disease Mother    Diabetes Mother    Hypertension Father    Stroke Father    Diabetes Father    Hypertension Brother    Breast cancer Maternal Aunt    Lung cancer Maternal Uncle    Prostate cancer Maternal Grandmother    Prostate cancer Maternal Grandfather    Alzheimer's disease Paternal Grandmother      Current Outpatient Medications:    Cholecalciferol (VITAMIN D3) 50 MCG (2000 UT) TABS, Take 1 tablet by mouth daily at 12 noon., Disp: , Rfl:    clobetasol (TEMOVATE) 0.05 % external solution, Apply 1 Application topically 2 (two) times daily. Apply twice daily as needed for flares, Disp: 50 mL, Rfl: 2   clobetasol ointment (TEMOVATE) 0.05 %, Apply 1 Application topically 2 (two) times daily. Apply to body BID for two weeks on then two weeks off, Disp: 60 g, Rfl: 2   furosemide (LASIX) 20 MG tablet, Take 1 tablet (20 mg total) by mouth daily., Disp: 90 tablet, Rfl: 2   ipratropium (ATROVENT) 0.03 % nasal spray, as needed.,  Disp: , Rfl:    levocetirizine (XYZAL) 5 MG tablet, Take 5 mg by mouth daily as needed., Disp: , Rfl:    metoprolol succinate (TOPROL-XL) 100 MG 24 hr tablet, Take 1 tablet (100 mg total) by mouth daily. TAKE WITH OR IMMEDIATELY FOLLOWING A MEAL., Disp: 90 tablet, Rfl: 1   sacubitril-valsartan (ENTRESTO) 49-51 MG, Take 1 tablet by mouth 2 (two) times daily., Disp: 180 tablet, Rfl: 2   spironolactone (ALDACTONE) 25 MG tablet, Take 0.5 tablets (12.5 mg total) by mouth daily., Disp: 45 tablet, Rfl: 2   tacrolimus (PROTOPIC) 0.1 % ointment, Apply topically 2 (two) times daily. Apply to face and affected areas on body twice daily for 2 weeks then stop. Restart in 2 weeks, Disp: 100 g, Rfl: 2   tinidazole (TINDAMAX) 500 MG tablet, Take 500 mg by mouth 2 (two) times daily as needed. (Patient not taking: Reported on 08/11/2022), Disp: , Rfl:    triamcinolone cream (KENALOG) 0.1 %, APPLY TO AFFECTED AREA TWICE DAILY AS NEEDED, Disp: 45 g, Rfl: 0   Ubrogepant (UBRELVY) 50 MG TABS, Take 1 tablet (50 mg total) by mouth daily., Disp: 16 tablet, Rfl:    valACYclovir (VALTREX) 1000 MG tablet, Take 1 tablet by mouth as needed., Disp: , Rfl:  No Known Allergies   Review of Systems  Constitutional: Negative.   Respiratory: Negative.    Cardiovascular: Negative.   Neurological: Negative.   Psychiatric/Behavioral: Negative.       There were no vitals filed for this visit. There is no height or weight on file to calculate BMI.  Wt Readings from Last 3 Encounters:  01/18/23 183 lb (83 kg)  10/08/22 182 lb 6.4 oz (82.7 kg)  08/25/22 184 lb (83.5 kg)     Objective:  Physical Exam      Assessment And Plan:  Hypertensive heart disease with chronic systolic congestive heart failure (HCC)     No follow-ups on file.  Patient was given opportunity to ask questions. Patient verbalized understanding of the plan and was able to repeat key elements of the plan. All questions were answered to their  satisfaction.  [name], MD  I, [name], MD, have reviewed all documentation for this visit. The documentation on 02/08/23 for the exam, diagnosis, procedures, and orders are all accurate and complete.   IF YOU HAVE BEEN REFERRED TO A SPECIALIST, IT MAY TAKE 1-2 WEEKS TO SCHEDULE/PROCESS THE REFERRAL. IF YOU HAVE NOT HEARD FROM US/SPECIALIST IN TWO WEEKS, PLEASE GIVE [place] A CALL AT [phone].   THE PATIENT IS ENCOURAGED TO PRACTICE SOCIAL DISTANCING DUE TO THE COVID-19 PANDEMIC.

## 2023-02-12 ENCOUNTER — Other Ambulatory Visit: Payer: Self-pay

## 2023-02-12 MED ORDER — SPIRONOLACTONE 25 MG PO TABS: 12.5000 mg | ORAL_TABLET | Freq: Every day | ORAL | 1 refills | Status: DC

## 2023-02-12 MED ORDER — METOPROLOL SUCCINATE ER 100 MG PO TB24: 100.0000 mg | ORAL_TABLET | Freq: Every day | ORAL | 1 refills | Status: DC

## 2023-02-12 MED ORDER — SACUBITRIL-VALSARTAN 49-51 MG PO TABS: 1.0000 | ORAL_TABLET | Freq: Two times a day (BID) | ORAL | 1 refills | Status: DC

## 2023-02-12 NOTE — Telephone Encounter (Signed)
Pt's medications were sent to pt's pharmacy as requested. Confirmation received.  

## 2023-03-16 ENCOUNTER — Other Ambulatory Visit: Payer: Self-pay | Admitting: Obstetrics and Gynecology

## 2023-03-19 LAB — SURGICAL PATHOLOGY

## 2023-05-13 ENCOUNTER — Encounter (HOSPITAL_COMMUNITY): Payer: Self-pay | Admitting: Obstetrics and Gynecology

## 2023-05-13 ENCOUNTER — Other Ambulatory Visit: Payer: Self-pay

## 2023-05-13 NOTE — Progress Notes (Signed)
Meredith Velez denies chest pain or shortness of breath.  Patient denies having any s/s of Covid in her household, also denies any known exposure to Covid. Meredith Velez denies  any s/s of upper or lower respiratory infection in the past 8 weeks.   Meredith Velez PCP is Dr. Dorothyann Peng, cardiologist is Dr. Donato Schultz.

## 2023-05-14 NOTE — Anesthesia Preprocedure Evaluation (Signed)
Anesthesia Evaluation  Patient identified by MRN, date of birth, ID band Patient awake    Reviewed: Allergy & Precautions, H&P , NPO status , Patient's Chart, lab work & pertinent test results  Airway Mallampati: II   Neck ROM: full    Dental   Pulmonary neg pulmonary ROS   breath sounds clear to auscultation       Cardiovascular hypertension,  Rhythm:regular Rate:Normal  EF 45%   Neuro/Psych  Headaches PSYCHIATRIC DISORDERS Anxiety Depression       GI/Hepatic PUD,GERD  ,,  Endo/Other    Renal/GU Renal disease     Musculoskeletal   Abdominal   Peds  Hematology   Anesthesia Other Findings   Reproductive/Obstetrics                             Anesthesia Physical Anesthesia Plan  ASA: 2  Anesthesia Plan: General   Post-op Pain Management:    Induction: Intravenous  PONV Risk Score and Plan: 3 and Ondansetron, Dexamethasone, Midazolam and Treatment may vary due to age or medical condition  Airway Management Planned: LMA  Additional Equipment:   Intra-op Plan:   Post-operative Plan: Extubation in OR  Informed Consent: I have reviewed the patients History and Physical, chart, labs and discussed the procedure including the risks, benefits and alternatives for the proposed anesthesia with the patient or authorized representative who has indicated his/her understanding and acceptance.     Dental advisory given  Plan Discussed with: CRNA, Anesthesiologist and Surgeon  Anesthesia Plan Comments: (PAT note written 05/14/2023 by Shonna Chock, PA-C.  )       Anesthesia Quick Evaluation

## 2023-05-14 NOTE — Progress Notes (Signed)
Anesthesia Chart Review: SAME DAY WORK-UP  Case: [id number] Date/Time: 05/17/23 1300   Procedure: DILATATION & CURETTAGE/HYSTEROSCOPY WITH HYDROTHERMAL ABLATION   Anesthesia type: General   Pre-op diagnosis: EXCESSIVE/FREQUENT MENSTRATION WITH REGULAR CYCLE   Location: [facility] ROOM 05 / [facility]   Surgeons: Osborn Coho, MD       DISCUSSION: Patient is a 37 year old female scheduled for the above procedure.  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   She is followed by Dr. Anne Fu for postpartum CM diagnosed in August 2020 with LVEF 20-25%. She was placed on Entresto and Toprol with improvement of LVEF to 40 to 45%.  She was doing okay at her last visit on 08/11/22 with good blood pressure control.  6 months follow-up planned. She had a normal BNP 01/18/23 per primary care when she presented with poorly controlled BP but in the setting of migraine. BP 132/87 on 01/27/23.  She is a same-day workup, so anesthesia team to evaluate on the day of surgery.  Labs on arrival as indicated.   VS: Ht 5\' 2"  (1.575 m)   Wt 83 kg   LMP 05/08/2023   BMI 33.47 kg/m  BP Readings from Last 3 Encounters:  01/27/23 132/87  01/18/23 (!) 120/90  10/28/22 (!) 167/108   Pulse Readings from Last 3 Encounters:  01/18/23 71  10/08/22 85  08/25/22 60     PROVIDERS: Dorothyann Peng, MD is PCP  Donato Schultz, MD is cardiologist   LABS: For day of surgery as indicated. Last results in [facility] include: Lab Results  Component Value Date   WBC 9.1 10/08/2022   HGB 12.9 10/08/2022   HCT 40.5 10/08/2022   PLT 354 10/08/2022   GLUCOSE 84 10/08/2022   ALT 14 10/08/2022   AST 18 10/08/2022   NA 140 10/08/2022   K 4.6 10/08/2022   CL 104 10/08/2022   CREATININE 1.01 (H) 10/08/2022   BUN 12 10/08/2022   CO2 20 10/08/2022   TSH 2.680 10/08/2022  Normal BNP 34.3 on 01/18/23.   EKG: 10/08/22: NSR   CV: Echo 06/24/21: IMPRESSIONS   1. Left  ventricular ejection fraction, by estimation, is 40 to 45%. The  left ventricle has mildly decreased function. The left ventricle  demonstrates global hypokinesis. Left ventricular diastolic parameters are  consistent with Grade I diastolic  dysfunction (impaired relaxation).   2. Right ventricular systolic function is normal. The right ventricular  size is normal.   3. The mitral valve is normal in structure. Trivial mitral valve  regurgitation. No evidence of mitral stenosis.   4. The aortic valve is normal in structure. Aortic valve regurgitation is  not visualized. No aortic stenosis is present.   5. The inferior vena cava is normal in size with greater than 50%  respiratory variability, suggesting right atrial pressure of 3 mmHg.   Comparison(s): Prior images reviewed side by side.  - Comparison 02/14/19: LVEF 20-25%, 04/25/19 LVEF 40-45%; 02/06/20 LVEF 35-40%   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   Seasonal allergies    Ulcer, stomach peptic    UTI (lower urinary tract infection)    Wears glasses     Past Surgical History:  Procedure Laterality Date   COLONOSCOPY     LAPAROSCOPIC BILATERAL SALPINGECTOMY Bilateral 01/04/2019   Procedure: LAPAROSCOPIC BILATERAL SALPINGECTOMY;  Surgeon: Osborn Coho, MD;  Location: [facility];  Service: Gynecology;  Laterality: Bilateral;    MEDICATIONS: No current facility-administered medications for this encounter.    Cholecalciferol (VITAMIN D3) 125 MCG (5000 UT) TABS   clobetasol (TEMOVATE) 0.05 % external solution   clobetasol ointment (TEMOVATE) 0.05 %   furosemide (LASIX) 20 MG tablet   metoprolol succinate (TOPROL-XL) 100 MG 24 hr tablet   norethindrone (AYGESTIN) 5 MG tablet    sacubitril-valsartan (ENTRESTO) 49-51 MG   spironolactone (ALDACTONE) 25 MG tablet   tacrolimus (PROTOPIC) 0.1 % ointment   valACYclovir (VALTREX) 1000 MG tablet   triamcinolone cream (KENALOG) 0.1 %   Ubrogepant (UBRELVY) 50 MG TABS    Shonna Chock, PA-C Surgical Short Stay/Anesthesiology [facility] Phone [phone] [facility] Phone [phone] 05/14/2023 12:56 PM

## 2023-05-17 ENCOUNTER — Ambulatory Visit (HOSPITAL_COMMUNITY)
Admission: RE | Admit: 2023-05-17 | Discharge: 2023-05-17 | Disposition: A | Discharge status: Home / Self Care | Payer: 59 | Attending: Obstetrics and Gynecology | Admitting: Obstetrics and Gynecology

## 2023-05-17 ENCOUNTER — Ambulatory Visit (HOSPITAL_COMMUNITY): Payer: 59 | Admitting: Vascular Surgery

## 2023-05-17 ENCOUNTER — Other Ambulatory Visit: Payer: Self-pay

## 2023-05-17 ENCOUNTER — Ambulatory Visit (HOSPITAL_BASED_OUTPATIENT_CLINIC_OR_DEPARTMENT_OTHER): Payer: 59 | Admitting: Vascular Surgery

## 2023-05-17 ENCOUNTER — Encounter (HOSPITAL_COMMUNITY): Payer: Self-pay | Admitting: Obstetrics and Gynecology

## 2023-05-17 ENCOUNTER — Encounter (HOSPITAL_COMMUNITY)
Admission: RE | Disposition: A | Discharge status: Home / Self Care | Payer: Self-pay | Source: Home / Self Care | Attending: Obstetrics and Gynecology

## 2023-05-17 DIAGNOSIS — N920 Excessive and frequent menstruation with regular cycle: Secondary | ICD-10-CM

## 2023-05-17 DIAGNOSIS — N939 Abnormal uterine and vaginal bleeding, unspecified: Secondary | ICD-10-CM

## 2023-05-17 HISTORY — PX: DILITATION & CURRETTAGE/HYSTROSCOPY WITH HYDROTHERMAL ABLATION: SHX5570

## 2023-05-17 HISTORY — DX: Heart failure, unspecified: I50.9

## 2023-05-17 LAB — BASIC METABOLIC PANEL
Anion gap: 7 (ref 5–15)
BUN: 12 mg/dL (ref 6–20)
CO2: 21 mmol/L — ABNORMAL LOW (ref 22–32)
Calcium: 8.9 mg/dL (ref 8.9–10.3)
Chloride: 106 mmol/L (ref 98–111)
Creatinine, Ser: 0.84 mg/dL (ref 0.44–1.00)
GFR, Estimated: >60 mL/min (ref >60)
Glucose, Bld: 86 mg/dL (ref 70–99)
Potassium: 3.9 mmol/L (ref 3.5–5.1)
Sodium: 134 mmol/L — ABNORMAL LOW (ref 135–145)

## 2023-05-17 LAB — CBC
HCT: 39.2 % (ref 36.0–46.0)
Hemoglobin: 12.6 g/dL (ref 12.0–15.0)
MCH: 29.6 pg (ref 26.0–34.0)
MCHC: 32.1 g/dL (ref 30.0–36.0)
MCV: 92.2 fL (ref 80.0–100.0)
Platelets: 379 10*3/uL (ref 150–400)
RBC: 4.25 MIL/uL (ref 3.87–5.11)
RDW: 12.9 % (ref 11.5–15.5)
WBC: 8.6 10*3/uL (ref 4.0–10.5)
nRBC: 0 % (ref 0.0–0.2)

## 2023-05-17 LAB — TYPE AND SCREEN
ABO/RH(D): O POS
Antibody Screen: NEGATIVE

## 2023-05-17 LAB — POCT PREGNANCY, URINE: Preg Test, Ur: NEGATIVE

## 2023-05-17 SURGERY — DILATATION & CURETTAGE/HYSTEROSCOPY WITH HYDROTHERMAL ABLATION
Anesthesia: General | Site: Uterus

## 2023-05-17 MED ORDER — ONDANSETRON HCL 4 MG/2ML IJ SOLN
INTRAMUSCULAR | Status: DC | PRN
  Administered 2023-05-17 (×2): 4 mg via INTRAVENOUS

## 2023-05-17 MED ORDER — LACTATED RINGERS IV SOLN: INTRAVENOUS | Status: DC

## 2023-05-17 MED ORDER — HYDRALAZINE HCL 20 MG/ML IJ SOLN
INTRAMUSCULAR | Status: AC
  Administered 2023-05-17: 10 mg via INTRAVENOUS
  Filled 2023-05-17: qty 1

## 2023-05-17 MED ORDER — LIDOCAINE HCL 2 % IJ SOLN
INTRAMUSCULAR | Status: AC
  Filled 2023-05-17: qty 20

## 2023-05-17 MED ORDER — ONDANSETRON 4 MG PO TBDP
4.0000 mg | ORAL_TABLET | Freq: Once | ORAL | Status: AC
  Administered 2023-05-17: 4 mg via ORAL
  Filled 2023-05-17: qty 1

## 2023-05-17 MED ORDER — LIDOCAINE HCL (PF) 2 % IJ SOLN
INTRAMUSCULAR | Status: DC | PRN
  Administered 2023-05-17: 10 mL

## 2023-05-17 MED ORDER — FENTANYL CITRATE (PF) 100 MCG/2ML IJ SOLN
INTRAMUSCULAR | Status: AC
  Administered 2023-05-17: 25 ug via INTRAVENOUS
  Filled 2023-05-17: qty 2

## 2023-05-17 MED ORDER — PROPOFOL 10 MG/ML IV BOLUS
INTRAVENOUS | Status: DC | PRN
  Administered 2023-05-17: 100 ug/kg/min via INTRAVENOUS
  Administered 2023-05-17: 150 mg via INTRAVENOUS
  Administered 2023-05-17: 50 mg via INTRAVENOUS

## 2023-05-17 MED ORDER — MIDAZOLAM HCL 2 MG/2ML IJ SOLN
INTRAMUSCULAR | Status: DC | PRN
  Administered 2023-05-17: 2 mg via INTRAVENOUS

## 2023-05-17 MED ORDER — KETAMINE HCL 50 MG/5ML IJ SOSY
PREFILLED_SYRINGE | INTRAMUSCULAR | Status: AC
  Filled 2023-05-17: qty 5

## 2023-05-17 MED ORDER — ORAL CARE MOUTH RINSE: 15.0000 mL | Freq: Once | OROMUCOSAL | Status: AC

## 2023-05-17 MED ORDER — SILVER NITRATE-POT NITRATE 75-25 % EX MISC
CUTANEOUS | Status: AC
  Filled 2023-05-17: qty 10

## 2023-05-17 MED ORDER — OXYCODONE HCL 5 MG PO TABS: 5.0000 mg | ORAL_TABLET | Freq: Four times a day (QID) | ORAL | 0 refills | Status: DC | PRN

## 2023-05-17 MED ORDER — CEFAZOLIN SODIUM-DEXTROSE 2-4 GM/100ML-% IV SOLN
2.0000 g | INTRAVENOUS | Status: AC
  Administered 2023-05-17: 2 g via INTRAVENOUS
  Filled 2023-05-17: qty 100

## 2023-05-17 MED ORDER — FENTANYL CITRATE (PF) 250 MCG/5ML IJ SOLN
INTRAMUSCULAR | Status: DC | PRN
  Administered 2023-05-17 (×2): 50 ug via INTRAVENOUS

## 2023-05-17 MED ORDER — DEXAMETHASONE SODIUM PHOSPHATE 10 MG/ML IJ SOLN
INTRAMUSCULAR | Status: DC | PRN
  Administered 2023-05-17: 10 mg via INTRAVENOUS

## 2023-05-17 MED ORDER — FENTANYL CITRATE (PF) 100 MCG/2ML IJ SOLN: 25.0000 ug | INTRAMUSCULAR | Status: DC | PRN

## 2023-05-17 MED ORDER — PROPOFOL 10 MG/ML IV BOLUS
INTRAVENOUS | Status: AC
  Filled 2023-05-17: qty 20

## 2023-05-17 MED ORDER — MIDAZOLAM HCL 2 MG/2ML IJ SOLN
INTRAMUSCULAR | Status: AC
  Filled 2023-05-17: qty 2

## 2023-05-17 MED ORDER — LIDOCAINE 2% (20 MG/ML) 5 ML SYRINGE
INTRAMUSCULAR | Status: DC | PRN
  Administered 2023-05-17: 60 mg via INTRAVENOUS

## 2023-05-17 MED ORDER — OXYCODONE HCL 5 MG/5ML PO SOLN: 5.0000 mg | Freq: Once | ORAL | Status: AC | PRN

## 2023-05-17 MED ORDER — SODIUM CHLORIDE 0.9 % IR SOLN
Status: DC | PRN
  Administered 2023-05-17: 3000 mL

## 2023-05-17 MED ORDER — POVIDONE-IODINE 10 % EX SWAB
2.0000 | Freq: Once | CUTANEOUS | Status: AC
  Administered 2023-05-17: 2 via TOPICAL

## 2023-05-17 MED ORDER — FENTANYL CITRATE (PF) 250 MCG/5ML IJ SOLN
INTRAMUSCULAR | Status: AC
  Filled 2023-05-17: qty 5

## 2023-05-17 MED ORDER — CHLORHEXIDINE GLUCONATE 0.12 % MT SOLN
15.0000 mL | Freq: Once | OROMUCOSAL | Status: AC
  Administered 2023-05-17: 15 mL via OROMUCOSAL
  Filled 2023-05-17: qty 15

## 2023-05-17 MED ORDER — HYDRALAZINE HCL 20 MG/ML IJ SOLN: 10.0000 mg | Freq: Once | INTRAMUSCULAR | Status: AC

## 2023-05-17 MED ORDER — OXYCODONE HCL 5 MG PO TABS
ORAL_TABLET | ORAL | Status: AC
  Administered 2023-05-17: 5 mg via ORAL
  Filled 2023-05-17: qty 1

## 2023-05-17 MED ORDER — OXYCODONE HCL 5 MG PO TABS: 5.0000 mg | ORAL_TABLET | Freq: Once | ORAL | Status: AC | PRN

## 2023-05-17 MED ORDER — IBUPROFEN 600 MG PO TABS: 600.0000 mg | ORAL_TABLET | Freq: Four times a day (QID) | ORAL | 0 refills | Status: DC | PRN

## 2023-05-17 MED ORDER — ONDANSETRON HCL 4 MG/2ML IJ SOLN: 4.0000 mg | Freq: Four times a day (QID) | INTRAMUSCULAR | Status: DC | PRN

## 2023-05-17 SURGICAL SUPPLY — 14 items
ABLATOR SURESOUND NOVASURE (ABLATOR) IMPLANT
CANISTER SUCT 3000ML PPV (MISCELLANEOUS) ×1 IMPLANT
CATH ROBINSON RED A/P 16FR (CATHETERS) ×1 IMPLANT
GLOVE BIO SURGEON STRL SZ7.5 (GLOVE) ×1 IMPLANT
GLOVE BIOGEL PI IND STRL 7.5 (GLOVE) ×2 IMPLANT
GLOVE SURG UNDER POLY LF SZ7 (GLOVE) ×1 IMPLANT
GOWN STRL REUS W/ TWL LRG LVL3 (GOWN DISPOSABLE) ×2 IMPLANT
GOWN STRL REUS W/TWL LRG LVL3 (GOWN DISPOSABLE) ×2
KIT PROCEDURE FLUENT (KITS) IMPLANT
PACK VAGINAL MINOR WOMEN LF (CUSTOM PROCEDURE TRAY) ×1 IMPLANT
PAD OB MATERNITY 4.3X12.25 (PERSONAL CARE ITEMS) ×1 IMPLANT
SET GENESYS HTA PROCERVA (MISCELLANEOUS) ×1 IMPLANT
TOWEL GREEN STERILE FF (TOWEL DISPOSABLE) ×2 IMPLANT
UNDERPAD 30X36 HEAVY ABSORB (UNDERPADS AND DIAPERS) ×1 IMPLANT

## 2023-05-17 NOTE — Progress Notes (Signed)
Fentanyl 75 mcq iv wasted in steri cycle. Witnessed by [employer] 

## 2023-05-17 NOTE — H&P (Addendum)
Meredith Velez is an 37 y.o. female. Pt well known to me presenting for hysteroscopy D&C ablation d/t menometrorrhagia/AUB.  Pertinent Gynecological History: Heavy, painful and frequent cycles  Menstrual History:  Patient's last menstrual period was 05/08/2023 (approximate).    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   Seasonal allergies    Ulcer, stomach peptic    UTI (lower urinary tract infection)    Wears glasses     Past Surgical History:  Procedure Laterality Date   COLONOSCOPY     LAPAROSCOPIC BILATERAL SALPINGECTOMY Bilateral 01/04/2019   Procedure: LAPAROSCOPIC BILATERAL SALPINGECTOMY;  Surgeon: Osborn Coho, MD;  Location: [facility];  Service: Gynecology;  Laterality: Bilateral;    Family History  Problem Relation Age of Onset   Hypertension Mother    Hyperlipidemia Mother    Heart disease Mother    Kidney disease Mother    Diabetes Mother    Hypertension Father    Stroke Father    Diabetes Father    Hypertension Brother    Breast cancer Maternal Aunt    Lung cancer Maternal Uncle    Prostate cancer Maternal Grandmother    Prostate cancer Maternal Grandfather    Alzheimer's disease Paternal Grandmother     Social History:  reports that she has never smoked. She has never used smokeless tobacco. She reports that she does not currently use alcohol. She reports that she does not use drugs.  Allergies: No Known Allergies  Medications Prior to Admission  Medication Sig Dispense Refill Last Dose   Cholecalciferol (VITAMIN D3) 125 MCG (5000 UT) TABS Take 5,000 Units by mouth daily at 12 noon.   Past Week   clobetasol (TEMOVATE) 0.05 % external solution Apply 1 Application topically 2 (two) times daily. Apply twice  daily as needed for flares 50 mL 2 Past Week   clobetasol ointment (TEMOVATE) 0.05 % Apply 1 Application topically 2 (two) times daily. Apply to body BID for two weeks on then two weeks off 60 g 2 Past Week   furosemide (LASIX) 20 MG tablet Take 1 tablet (20 mg total) by mouth daily. (Patient taking differently: Take 20 mg by mouth daily as needed for fluid or edema.) 90 tablet 2 05/16/2023   metoprolol succinate (TOPROL-XL) 100 MG 24 hr tablet Take 1 tablet (100 mg total) by mouth daily. TAKE WITH OR IMMEDIATELY FOLLOWING A MEAL. 90 tablet 1 05/17/2023 at 0845   norethindrone (AYGESTIN) 5 MG tablet Take 10 mg by mouth daily.   Past Week   sacubitril-valsartan (ENTRESTO) 49-51 MG Take 1 tablet by mouth 2 (two) times daily. (Patient taking differently: Take 1 tablet by mouth daily.) 180 tablet 1 05/16/2023   spironolactone (ALDACTONE) 25 MG tablet Take 0.5 tablets (12.5 mg total) by mouth daily. (Patient taking differently: Take 12.5 mg by mouth daily as needed (fluid).) 45 tablet 1 Past Week   tacrolimus (PROTOPIC) 0.1 % ointment Apply topically 2 (two) times daily. Apply to face and affected areas on body twice daily for 2 weeks then stop. Restart in 2 weeks 100 g 2 Past Week   triamcinolone cream (KENALOG) 0.1 % APPLY TO AFFECTED AREA TWICE DAILY AS NEEDED (Patient not taking: Reported on 05/11/2023) 45  g 0 Not Taking   Ubrogepant (UBRELVY) 50 MG TABS Take 1 tablet (50 mg total) by mouth daily. (Patient not taking: Reported on 05/11/2023) 16 tablet  Not Taking   valACYclovir (VALTREX) 1000 MG tablet Take 1 tablet by mouth daily as needed (Cold sores).   More than a month    Review of Systems Denies F/C/N/V/D  Blood pressure (!) 177/115, pulse 80, temperature 98.7 F (37.1 C), temperature source Oral, resp. rate 18, height 5\' 2"  (1.575 m), weight 83.9 kg, last menstrual period 05/08/2023, SpO2 98%. Physical Exam  Lungs unlabored CV RRR Abdomen soft, NT Extremities no calf tenderness  Results  for orders placed or performed during the hospital encounter of 05/17/23 (from the past 24 hour(s))  Pregnancy, urine POC     Status: None   Collection Time: 05/17/23 11:11 AM  Result Value Ref Range   Preg Test, Ur NEGATIVE NEGATIVE  CBC     Status: None   Collection Time: 05/17/23 11:30 AM  Result Value Ref Range   WBC 8.6 4.0 - 10.5 K/uL   RBC 4.25 3.87 - 5.11 MIL/uL   Hemoglobin 12.6 12.0 - 15.0 g/dL   HCT 52.8 41.3 - 24.4 %   MCV 92.2 80.0 - 100.0 fL   MCH 29.6 26.0 - 34.0 pg   MCHC 32.1 30.0 - 36.0 g/dL   RDW 01.0 27.2 - 53.6 %   Platelets 379 150 - 400 K/uL   nRBC 0.0 0.0 - 0.2 %  Basic metabolic panel     Status: Abnormal   Collection Time: 05/17/23 11:30 AM  Result Value Ref Range   Sodium 134 (L) 135 - 145 mmol/L   Potassium 3.9 3.5 - 5.1 mmol/L   Chloride 106 98 - 111 mmol/L   CO2 21 (L) 22 - 32 mmol/L   Glucose, Bld 86 70 - 99 mg/dL   BUN 12 6 - 20 mg/dL   Creatinine, Ser 6.44 0.44 - 1.00 mg/dL   Calcium 8.9 8.9 - 03.4 mg/dL   GFR, Estimated >74 >25 mL/min   Anion gap 7 5 - 15  Type and screen [facility]     Status: None   Collection Time: 05/17/23 11:35 AM  Result Value Ref Range   ABO/RH(D) O POS    Antibody Screen NEG    Sample Expiration      05/20/2023,2359 Performed at [facility], [address], [place], [place] [address]     No results found.  Assessment/Plan: [id number] with menometrorrhagia/AUB presenting for hysteroscopy D&C and Ablation.  Risks benefits alternatives reviewed with the patient including but not limited to bleeding infection and injury.  Questions answered and consent signed and witnessed.  H/o sterilization and does not desire future fertility.  Possible resection if indicated.  Purcell Nails 05/17/2023, 1:28 PM

## 2023-05-17 NOTE — Progress Notes (Signed)
Dr. Chaney Malling made aware of patient's elevated BP readings upon arrival to Short Stay ( 173/115 and 177/115) and that patient states she took Metoprolol XL 100 mg at 845 am. Per the patient she has anxiety when at medical facilities and her BP does not normally run this high. No new orders received. Will continue to monitor.

## 2023-05-17 NOTE — Transfer of Care (Signed)
Immediate Anesthesia Transfer of Care Note  Patient: Meredith Velez  Procedure(s) Performed: DILATATION & CURETTAGE/HYSTEROSCOPY WITH HYDROTHERMAL ABLATION (Uterus)  Patient Location: PACU  Anesthesia Type:General  Level of Consciousness: drowsy and patient cooperative  Airway & Oxygen Therapy: Patient Spontanous Breathing and Patient connected to face mask oxygen  Post-op Assessment: Report given to RN  Post vital signs: Reviewed and stable  Last Vitals:  Vitals Value Taken Time  BP 140/92 05/17/23 1503  Temp    Pulse 76 05/17/23 1506  Resp 17 05/17/23 1509  SpO2 100 % 05/17/23 1506  Vitals shown include unfiled device data.  Last Pain:  Vitals:   05/17/23 1119  TempSrc:   PainSc: 0-No pain      Patients Stated Pain Goal: 0 (05/17/23 1119)  Complications: There were no known notable events for this encounter.

## 2023-05-17 NOTE — Anesthesia Procedure Notes (Signed)
Procedure Name: LMA Insertion Date/Time: 05/17/2023 2:02 PM  Performed by: Randa Evens, CRNAPre-anesthesia Checklist: Patient identified, Emergency Drugs available, Suction available and Patient being monitored Patient Re-evaluated:Patient Re-evaluated prior to induction Oxygen Delivery Method: Circle System Utilized Preoxygenation: Pre-oxygenation with 100% oxygen Induction Type: IV induction Ventilation: Mask ventilation without difficulty LMA: LMA inserted LMA Size: 4.0 Number of attempts: 1 Airway Equipment and Method: Bite block Placement Confirmation: positive ETCO2 Tube secured with: Tape Dental Injury: Teeth and Oropharynx as per pre-operative assessment

## 2023-05-17 NOTE — Op Note (Signed)
Preop Diagnosis: EXCESSIVE/FREQUENT MENSTRATION   Postop Diagnosis: EXCESSIVE/FREQUENT MENSTRATION   Procedure: DILATATION & CURETTAGE/HYSTEROSCOPY WITH HYDROTHERMAL ABLATION   Anesthesia: General   Anesthesiologist: Achille Rich, MD   Attending: Osborn Coho, MD   Assistant: N/a  Findings: No obvious intracavitary lesions  Pathology: Endomtrial Curettings  Fluids: 500 cc  UOP: 150 cc  EBL: Minimal <5 cc  Complications: None  Procedure:  The patient was taken to the operating room after the risks, benefits and alternatives discussed with the patient. The patient verbalized understanding and consent signed and witnessed. The patient was given a spinal per anesthesia and prepped and draped in the normal sterile fashion and Time Out performed per protocol. A bivalve speculum was placed in the patient's vagina and the anterior lip of the cervix was grasped with a single tooth tenaculum. A paracervical block was administered using a total of 10 cc of 2% lidocaine. The uterus sounded to 10.5 cm. The cervix was dilated for passage of the hysteroscope. The hysteroscope was introduced into the uterine cavity and findings as noted above. Sharp curettage was performed until a gritty texture was noted and curettings sent to pathology. The hysteroscope was reintroduced and no obvious remaining intracavitary lesions were noted.  The novasure instrument was placed in the cavity but cavity width not wide enough.  Decision made to do HTA instead.  The hydrothermal ablation instrument was introduced and ablation performed without difficulty. Hysteroscope reintroduced and good ablation results were noted. All instruments were removed. Sponge lap and needle count was correct. The patient tolerated the procedure well and was returned to the recovery room in good condition.

## 2023-05-18 ENCOUNTER — Encounter (HOSPITAL_COMMUNITY): Payer: Self-pay | Admitting: Obstetrics and Gynecology

## 2023-05-18 NOTE — Anesthesia Postprocedure Evaluation (Signed)
Anesthesia Post Note  Patient: Meredith Velez  Procedure(s) Performed: DILATATION & CURETTAGE/HYSTEROSCOPY WITH HYDROTHERMAL ABLATION (Uterus)     Patient location during evaluation: PACU Anesthesia Type: General Level of consciousness: awake and alert Pain management: pain level controlled Vital Signs Assessment: post-procedure vital signs reviewed and stable Respiratory status: spontaneous breathing, nonlabored ventilation, respiratory function stable and patient connected to nasal cannula oxygen Cardiovascular status: blood pressure returned to baseline and stable Postop Assessment: no apparent nausea or vomiting Anesthetic complications: no   There were no known notable events for this encounter.  Last Vitals:  Vitals:   05/17/23 1530 05/17/23 1545  BP: (!) 177/115 (!) 158/93  Pulse: 68 71  Resp: 13 16  Temp:  (!) 36.1 C  SpO2: 100% 100%    Last Pain:  Vitals:   05/17/23 1540  TempSrc:   PainSc: Asleep                 [name] S

## 2023-05-19 LAB — SURGICAL PATHOLOGY

## 2023-05-22 ENCOUNTER — Other Ambulatory Visit: Payer: Self-pay | Admitting: Cardiology

## 2023-05-25 ENCOUNTER — Ambulatory Visit: Payer: 59

## 2023-05-25 NOTE — Progress Notes (Deleted)
Patient presents today for bpc. She currently takes metoprolol 100MG  & Spironolactone 25MG .

## 2023-06-10 ENCOUNTER — Encounter: Payer: Self-pay | Admitting: Dermatology

## 2023-06-10 ENCOUNTER — Ambulatory Visit: Payer: 59 | Admitting: Dermatology

## 2023-06-10 VITALS — BP 141/95

## 2023-06-10 DIAGNOSIS — Z79899 Other long term (current) drug therapy: Secondary | ICD-10-CM | POA: Diagnosis not present

## 2023-06-10 DIAGNOSIS — L409 Psoriasis, unspecified: Secondary | ICD-10-CM | POA: Diagnosis not present

## 2023-06-10 MED ORDER — SOTYKTU 6 MG PO TABS: 1.0000 | ORAL_TABLET | Freq: Every day | ORAL | 30 refills | Status: DC

## 2023-06-10 NOTE — Patient Instructions (Addendum)
Hello Meredith Velez,  Thank you for visiting [place] again. Your dedication to managing your psoriasis is commendable. Here is a summary of the key instructions from our recent appointment:  - Medications Prescribed:   - Clobetasol: Continue using during flare-ups.   - Tacrolimus: Continue regular application on your face.   - Sotyktu: Start this oral tablet for psoriasis. A prescription will be sent to your pharmacy.  - Laboratory Tests:   - Please have a Quantiferon gold test and lipid profile done at [facility] tomorrow morning.  - Follow-Up Appointments:   - Schedule a follow-up in three months to evaluate the effectiveness of Sotyktu.   - Your next appointment should be four months from now.  - Refills:   - Refills for all your topical medications have been provided.  - Additional Instructions:   - Continue using the medicated drops as needed for itch relief.   - Please sign the form for Sotyktu  We will proceed with the script for Sotyktu and provide a sample pack once approved by your insurance.  Should there be any issues with approval, we will look into patient assistance or other options.  We appreciate your proactive approach to your health and look forward to seeing your progress.  Warm regards,  Dr. Langston Reusing, Dermatology   Important Information  Due to recent changes in healthcare laws, you may see results of your pathology and/or laboratory studies on MyChart before the doctors have had a chance to review them. We understand that in some cases there may be results that are confusing or concerning to you. Please understand that not all results are received at the same time and often the doctors may need to interpret multiple results in order to provide you with the best plan of care or course of treatment. Therefore, we ask that you please give [place] 2 business days to thoroughly review all your results before contacting the office for clarification. Should we see a critical lab  result, you will be contacted sooner.   If You Need Anything After Your Visit  If you have any questions or concerns for your doctor, please call our main line at [phone] If no one answers, please leave a voicemail as directed and we will return your call as soon as possible. Messages left after 4 pm will be answered the following business day.   You may also send [place] a message via MyChart. We typically respond to MyChart messages within 1-2 business days.  For prescription refills, please ask your pharmacy to contact our office. Our fax number is [phone].  If you have an urgent issue when the clinic is closed that cannot wait until the next business day, you can page your doctor at the number below.    Please note that while we do our best to be available for urgent issues outside of office hours, we are not available 24/7.   If you have an urgent issue and are unable to reach [place], you may choose to seek medical care at your doctor's office, retail clinic, urgent care center, or emergency room.  If you have a medical emergency, please immediately call 911 or go to the emergency department. In the event of inclement weather, please call our main line at [phone] for an update on the status of any delays or closures.  Dermatology Medication Tips: Please keep the boxes that topical medications come in in order to help keep track of the instructions about where and how to use  these. Pharmacies typically print the medication instructions only on the boxes and not directly on the medication tubes.   If your medication is too expensive, please contact our office at [phone] or send [place] a message through MyChart.   We are unable to tell what your co-pay for medications will be in advance as this is different depending on your insurance coverage. However, we may be able to find a substitute medication at lower cost or fill out paperwork to get insurance to cover a needed medication.    If a prior authorization is required to get your medication covered by your insurance company, please allow [place] 1-2 business days to complete this process.  Drug prices often vary depending on where the prescription is filled and some pharmacies may offer cheaper prices.  The website www.goodrx.com contains coupons for medications through different pharmacies. The prices here do not account for what the cost may be with help from insurance (it may be cheaper with your insurance), but the website can give you the price if you did not use any insurance.  - You can print the associated coupon and take it with your prescription to the pharmacy.  - You may also stop by our office during regular business hours and pick up a GoodRx coupon card.  - If you need your prescription sent electronically to a different pharmacy, notify our office through [facility] or by phone at [phone]

## 2023-06-10 NOTE — Progress Notes (Signed)
Follow-Up Visit   Subjective  Meredith Velez is a 37 y.o. female who presents for the following: Psoriasis of scalp, arms and legs. She is doing pretty good. Her scalp did flare a little bit with weather change. She alternates clobetasol and Tacrolimus every 2 weeks for her arms and legs. She is using clobetasol solution on her scalp prn itch. She has the [employer] Z[employer] shampoo but so far has forgotten to take it to the salon with with her. She does use tacrolimus on her face daily.    The following portions of the chart were reviewed this encounter and updated as appropriate: medications, allergies, medical history  Review of Systems:  No other skin or systemic complaints except as noted in HPI or Assessment and Plan.  Objective  Well appearing patient in no apparent distress; mood and affect are within normal limits.  Areas Examined:   Relevant exam findings are noted in the Assessment and Plan.      Assessment & Plan   The patient, Meredith Velez, reports that they have been able to stop using clobetasol for a couple of weeks but still use it occasionally during flare-ups. They continue to use tacrolimus on their face regularly, as stopping its use leads to flare-ups. The patient mentions that the drops help with the itch, but they forget to take their medicated shampoo to the hairdresser.  Meredith Velez confirms that they have psoriasis and not seborrheic dermatitis. They find the use of topicals effective but challenging to apply daily and would prefer not to use them every day. The patient is interested in starting the approval process for the oral medication Sotyktu to help with scalp improvement.   1. Psoriasis - Assessment: Patient utilizes clobetasol for scalp psoriasis and tacrolimus for facial psoriasis. Clobetasol is now used only occasionally during flare-ups, while tacrolimus is applied regularly on the face, with flare-ups noted upon discontinuation. Both topical  treatments are effective, yet daily application presents challenges for the patient. - Plan: Begin the approval process for the oral medication Sotyktu. Conduct screening tests, including Quantiferon Gold for TB and a lipid profile. Continue with current topical treatments (clobetasol and tacrolimus) as needed and provide refills for these medications. Schedule a follow-up appointment in 4 months. The patient is to pick up Sotyktu samples once approved without needing an appointment. Explore patient assistance programs if insurance coverage for Sotyktu is denied.  Pt is not a candidate for methotrexate or cyclosporine due to inability for follow up labs and contraindications with other medications. Pt has no access to a light box for phototherapy.  Due to the progressive and chronic nature of her psoriasis, patient has tried and failed numerous topicals creams as noted above, the next best therapeutic option is a systemic therapy. It is medically necessary to help improve her quality of life.     2. High Risk Medication Management - Assessment: The patient needs a new prescription for Sotyku and requires laboratory tests for ongoing medication management. - Plan: Have the patient sign the necessary form to receive a Sotyku prescription. Arrange for fasting laboratory tests to be conducted at [facility] the following morning. Schedule a follow-up in 3 months to evaluate the effectiveness of the medication.     Psoriasis  Related Procedures Lipid Panel QuantiFERON-TB Gold Plus  Related Medications clobetasol ointment (TEMOVATE) 0.05 % Apply 1 Application topically 2 (two) times daily. Apply to body BID for two weeks on then two weeks off  tacrolimus (PROTOPIC) 0.1 %  ointment Apply topically 2 (two) times daily. Apply to face and affected areas on body twice daily for 2 weeks then stop. Restart in 2 weeks      Return in about 4 months (around 10/09/2023) for Psoriasis.  I, Joanie Coddington,  CMA, am acting as scribe for [employer], DO .   Documentation: I have reviewed the above documentation for accuracy and completeness, and I agree with the above.  Langston Reusing, DO

## 2023-07-13 ENCOUNTER — Other Ambulatory Visit: Payer: Self-pay | Admitting: Dermatology

## 2023-07-13 MED ORDER — SOTYKTU 6 MG PO TABS: 1.0000 | ORAL_TABLET | ORAL | 2 refills | Status: AC

## 2023-07-13 NOTE — Progress Notes (Signed)
 Senderra asked [place] to send rx to her insurance preferred pharmacy

## 2023-08-23 ENCOUNTER — Encounter: Payer: Self-pay | Admitting: Physician Assistant

## 2023-08-23 ENCOUNTER — Ambulatory Visit: Payer: 59 | Attending: Physician Assistant | Admitting: Physician Assistant

## 2023-08-23 VITALS — BP 134/92 | HR 88 | Ht 62.0 in | Wt 179.0 lb

## 2023-08-23 DIAGNOSIS — I5022 Chronic systolic (congestive) heart failure: Secondary | ICD-10-CM | POA: Diagnosis not present

## 2023-08-23 DIAGNOSIS — I5089 Other heart failure: Secondary | ICD-10-CM

## 2023-08-23 DIAGNOSIS — Z79899 Other long term (current) drug therapy: Secondary | ICD-10-CM | POA: Diagnosis not present

## 2023-08-23 DIAGNOSIS — I110 Hypertensive heart disease with heart failure: Secondary | ICD-10-CM

## 2023-08-23 DIAGNOSIS — I10 Essential (primary) hypertension: Secondary | ICD-10-CM

## 2023-08-23 DIAGNOSIS — O903 Peripartum cardiomyopathy: Secondary | ICD-10-CM

## 2023-08-23 MED ORDER — SPIRONOLACTONE 25 MG PO TABS: 12.5000 mg | ORAL_TABLET | Freq: Every day | ORAL | 3 refills | Status: DC

## 2023-08-23 MED ORDER — METOPROLOL SUCCINATE ER 100 MG PO TB24: 100.0000 mg | ORAL_TABLET | Freq: Every day | ORAL | 3 refills | Status: AC

## 2023-08-23 MED ORDER — FUROSEMIDE 20 MG PO TABS: 20.0000 mg | ORAL_TABLET | Freq: Every day | ORAL | 3 refills | Status: AC

## 2023-08-23 MED ORDER — SACUBITRIL-VALSARTAN 49-51 MG PO TABS: 1.0000 | ORAL_TABLET | Freq: Two times a day (BID) | ORAL | 3 refills | Status: AC

## 2023-08-23 NOTE — Progress Notes (Signed)
Cardiology Office Note:  .   Date:  08/23/2023  ID:  Meredith Velez, DOB August 14, 1985, MRN [id number] PCP: Dorothyann Peng, MD  [facility] Providers Cardiologist:  Donato Schultz, MD {    History of Present Illness: .   Meredith Velez is a 38 y.o. female with a past medical history of peripartum cardiomyopathy, hypertension, anxiety, depression here for follow-up appointment.  Hospitalized in 2020.  Initial EF 20 to 25%.  Entresto and metoprolol succinate.  Repeat echo 45%.  Patient understood that while pregnant cannot take Entresto.  She had a tubal ligation with her last pregnancy.  She was previously quite depressed about her cardiac situation.  Alfonse Ras was social work helped her significantly with life coaching, patient care etc.  She participated with a  care team education video.  Blood pressure was under excellent control.  Her daughter runs track and she wanted to walk the track more often.  Work has been busy but overall was doing okay at her last appointment a year ago.  Today, she presents with a history of systolic heart failure for routine follow-up.  She reports that she has been feeling fine and has been trying to appropriate cardio into her routine by walking on a treadmill at home.  Also made some dietary changes such as cutting out sodas to drink more water.  However, noticed that her blood pressure has been higher than usual during recent visits which was unusual for her.  She has not been checking her blood pressure at home but agrees to start monitoring it daily.  She also mentioned a procedure that she had last year, and D&C, after which she noticed a spike in her blood pressure.  Reports no shortness of breath nor dyspnea on exertion. Reports no chest pain, pressure, or tightness. No edema, orthopnea, PND. Reports no palpitations.    Discussed the use of AI scribe software for clinical note transcription with the patient, who gave verbal  consent to proceed.   ROS: Pertinent ROS in HPI  Studies Reviewed: .        Echocardiogram 02/14/2019:   1. The left ventricle has severely reduced systolic function, with an  ejection fraction of 20-25%. The cavity size was severely dilated. Left  ventricular diastolic Doppler parameters are consistent with  pseudonormalization. Elevated left ventricular  end-diastolic pressure.   2. The right ventricle has normal systolic function. The cavity was  normal. There is no increase in right ventricular wall thickness.   3. Mild thickening of the mitral valve leaflet. Mild calcification of the  mitral valve leaflet.   4. The aortic valve is tricuspid. Mild thickening of the aortic valve.  Mild calcification of the aortic valve.   5. The aorta is normal in size and structure.   6. The interatrial septum was not well visualized.   Echocardiogram 04/07/2019:   Left ventricular ejection fraction, by visual estimation, is 40 to 45%. The left ventricle has moderately decreased function. Mildly increased left ventricular size. There is no left ventricular hypertrophy. 2. Apical window is foreshortened some, making evaluation difficult Compared to echo images from Aug 2020, LVEF appears improved. 3. Global right ventricle has normal systolic function.The right ventricular size is normal. No increase in right ventricular wall thickness   06/24/21 ECHO:   1. Left ventricular ejection fraction, by estimation, is 40 to 45%. The  left ventricle has mildly decreased function. The left ventricle  demonstrates global hypokinesis. Left ventricular diastolic parameters are  consistent with Grade I diastolic  dysfunction (impaired relaxation).   2. Right ventricular systolic function is normal. The right ventricular  size is normal.   3. The mitral valve is normal in structure. Trivial mitral valve  regurgitation. No evidence of mitral stenosis.   4. The aortic valve is normal in structure. Aortic  valve regurgitation is  not visualized. No aortic stenosis is present.   5. The inferior vena cava is normal in size with greater than 50%  respiratory variability, suggesting right atrial pressure of 3 mmHg.   Physical Exam:   VS:  BP (!) 134/92   Pulse 88   Ht 5\' 2"  (1.575 m)   Wt 179 lb (81.2 kg)   SpO2 96%   BMI 32.74 kg/m    Wt Readings from Last 3 Encounters:  08/23/23 179 lb (81.2 kg)  05/17/23 185 lb (83.9 kg)  01/18/23 183 lb (83 kg)    GEN: Well nourished, well developed in no acute distress NECK: No JVD; No carotid bruits CARDIAC: RRR, no murmurs, rubs, gallops RESPIRATORY:  Clear to auscultation without rales, wheezing or rhonchi  ABDOMEN: Soft, non-tender, non-distended EXTREMITIES:  No edema; No deformity   ASSESSMENT AND PLAN: .   Hypertension Elevated diastolic blood pressure noted during today's visit. Patient has home blood pressure monitoring capabilities but has not been regularly checking. Recent history of elevated blood pressure readings during medical visits. -Advise patient to monitor blood pressure daily for 1-2 weeks, an hour after morning medications, and report values via MyChart.  Chronic Systolic Heart Failure Patient is currently on Entresto 49/51mg , Spironolactone 12.5mg , and Lasix daily. Last echocardiogram was over two years ago. -Order an updated echocardiogram to assess current heart pump function. -Continue current medications and ensure patient has sufficient refills.  General Health Maintenance / Followup Plans -Schedule a six-month follow-up appointment with Dr. Anne Fu -Encourage continued physical activity and healthy dietary choices.       Signed, Sharlene Dory, PA-C

## 2023-08-23 NOTE — Patient Instructions (Addendum)
Medication Instructions:  Your physician recommends that you continue on your current medications as directed. Please refer to the Current Medication list given to you today.  *If you need a refill on your cardiac medications before your next appointment, please call your pharmacy*   Testing/Procedures: Echocardiogram  Your physician has requested that you have an echocardiogram. Echocardiography is a painless test that uses sound waves to create images of your heart. It provides your doctor with information about the size and shape of your heart and how well your heart's chambers and valves are working. This procedure takes approximately one hour. There are no restrictions for this procedure. Please do NOT wear cologne, perfume, aftershave, or lotions (deodorant is allowed). Please arrive 15 minutes prior to your appointment time.  Please note: We ask at that you not bring children with you during ultrasound (echo/ vascular) testing. Due to room size and safety concerns, children are not allowed in the ultrasound rooms during exams. Our front office staff cannot provide observation of children in our lobby area while testing is being conducted. An adult accompanying a patient to their appointment will only be allowed in the ultrasound room at the discretion of the ultrasound technician under special circumstances. We apologize for any inconvenience.   Follow-Up: At [facility], you and your health needs are our priority.  As part of our continuing mission to provide you with exceptional heart care, we have created designated Provider Care Teams.  These Care Teams include your primary Cardiologist (physician) and Advanced Practice Providers (APPs -  Physician Assistants and Nurse Practitioners) who all work together to provide you with the care you need, when you need it.  Your next appointment:   6 months  Provider:   Donato Schultz, MD     Other Instructions Monitor your blood  pressure daily - one hour after taking your medications and send [place] a list of your readings after one week.    1st Floor: - Lobby - Registration  - Pharmacy  - Lab - Cafe  2nd Floor: - PV Lab - Diagnostic Testing (echo, CT, nuclear med)  3rd Floor: - Vacant  4th Floor: - TCTS (cardiothoracic surgery) - AFib Clinic - Structural Heart Clinic - Vascular Surgery  - Vascular Ultrasound  5th Floor: - [facility] Cardiology (general and EP) - Clinical Pharmacy for coumadin, hypertension, lipid, weight-loss medications, and med management appointments    Valet parking services will be available as well.

## 2023-09-16 ENCOUNTER — Ambulatory Visit (HOSPITAL_COMMUNITY): Payer: 59 | Attending: Internal Medicine

## 2023-09-16 DIAGNOSIS — Z3A00 Weeks of gestation of pregnancy not specified: Secondary | ICD-10-CM

## 2023-09-16 DIAGNOSIS — Z79899 Other long term (current) drug therapy: Secondary | ICD-10-CM | POA: Diagnosis not present

## 2023-09-16 DIAGNOSIS — I10 Essential (primary) hypertension: Secondary | ICD-10-CM

## 2023-09-16 DIAGNOSIS — I5022 Chronic systolic (congestive) heart failure: Secondary | ICD-10-CM

## 2023-09-16 DIAGNOSIS — I5089 Other heart failure: Secondary | ICD-10-CM | POA: Diagnosis present

## 2023-09-16 DIAGNOSIS — I110 Hypertensive heart disease with heart failure: Secondary | ICD-10-CM

## 2023-09-16 DIAGNOSIS — O903 Peripartum cardiomyopathy: Secondary | ICD-10-CM | POA: Diagnosis present

## 2023-09-16 LAB — ECHOCARDIOGRAM COMPLETE
Area-P 1/2: 3.5 cm2
S' Lateral: 3.7 cm

## 2023-09-21 ENCOUNTER — Encounter: Payer: Self-pay | Admitting: Physician Assistant

## 2023-10-12 ENCOUNTER — Encounter: Payer: Self-pay | Admitting: Internal Medicine

## 2023-10-14 ENCOUNTER — Ambulatory Visit: Payer: 59 | Admitting: Dermatology

## 2023-10-27 ENCOUNTER — Encounter: Payer: Self-pay | Admitting: Internal Medicine

## 2023-10-27 ENCOUNTER — Ambulatory Visit (INDEPENDENT_AMBULATORY_CARE_PROVIDER_SITE_OTHER): Payer: Self-pay | Admitting: Internal Medicine

## 2023-10-27 VITALS — BP 130/100 | HR 71 | Temp 98.6°F | Ht 62.0 in | Wt 177.4 lb

## 2023-10-27 DIAGNOSIS — E66811 Obesity, class 1: Secondary | ICD-10-CM

## 2023-10-27 DIAGNOSIS — G43809 Other migraine, not intractable, without status migrainosus: Secondary | ICD-10-CM

## 2023-10-27 DIAGNOSIS — R202 Paresthesia of skin: Secondary | ICD-10-CM | POA: Diagnosis not present

## 2023-10-27 DIAGNOSIS — Z6832 Body mass index (BMI) 32.0-32.9, adult: Secondary | ICD-10-CM

## 2023-10-27 DIAGNOSIS — Z0000 Encounter for general adult medical examination without abnormal findings: Secondary | ICD-10-CM | POA: Diagnosis not present

## 2023-10-27 DIAGNOSIS — E6609 Other obesity due to excess calories: Secondary | ICD-10-CM

## 2023-10-27 DIAGNOSIS — I5022 Chronic systolic (congestive) heart failure: Secondary | ICD-10-CM

## 2023-10-27 DIAGNOSIS — R1114 Bilious vomiting: Secondary | ICD-10-CM

## 2023-10-27 DIAGNOSIS — I110 Hypertensive heart disease with heart failure: Secondary | ICD-10-CM

## 2023-10-27 DIAGNOSIS — Z833 Family history of diabetes mellitus: Secondary | ICD-10-CM

## 2023-10-27 DIAGNOSIS — E559 Vitamin D deficiency, unspecified: Secondary | ICD-10-CM

## 2023-10-27 MED ORDER — PROMETHAZINE HCL 12.5 MG PO TABS: 12.5000 mg | ORAL_TABLET | Freq: Four times a day (QID) | ORAL | Status: DC | PRN

## 2023-10-27 MED ORDER — PROMETHAZINE HCL 25 MG/ML IJ SOLN
12.5000 mg | Freq: Once | INTRAMUSCULAR | Status: AC
  Administered 2023-10-27: 12.5 mg via INTRAMUSCULAR

## 2023-10-27 NOTE — Assessment & Plan Note (Signed)

## 2023-10-27 NOTE — Patient Instructions (Signed)

## 2023-10-27 NOTE — Progress Notes (Signed)
 I,Meredith Velez, CMA,acting as a Neurosurgeon for Meredith Dung, MD.,have documented all relevant documentation on the behalf of Meredith Dung, MD,as directed by  Meredith Dung, MD while in the presence of Meredith Dung, MD.  Subjective:  Patient ID: Meredith Velez , female    DOB: 18-Jan-1986 , 38 y.o.   MRN: [id number]  Chief Complaint  Patient presents with   Annual Exam    Patient presents today for annual exam. She reports her pharmacy happened to mix her medicine up with another persons. The bag along with the medicine had her name on them. The pills also looked similar. Due to that she has been hesitant on taking her bp medication. This morning she woke up vomiting, nausea, light headed, weakness. Denies fever/ chills.     Hypertension    HPI Discussed the use of AI scribe software for clinical note transcription with the patient, who gave verbal consent to proceed.  History of Present Illness Meredith Velez is a 38 year old female with hypertension and cardiomyopathy who presents for a physical and blood pressure check. She is accompanied by her husband today.   Over a month ago, she received the wrong medication from the pharmacy, leading to hesitancy in taking her current medications as prescribed. This has resulted in inconsistent adherence, particularly with her blood pressure medications.  She experiences nausea and lightheadedness, which she attributes to elevated blood pressure. She woke up feeling nauseous for the first time today and has been belching this morning. No fever, chills, heartburn, or burning sensations are present.  Her current medications include Lasix  (furosemide ) taken at bedtime, which causes nocturia, metoprolol  taken at bedtime, and Entresto  taken twice daily with doses at breakfast and dinner. She has missed doses recently due to being busy and forgetting.  She has a history of cardiomyopathy following pregnancies and is under the care  of a cardiologist. An echocardiogram in February showed improved heart function.  She experiences numbness and occasional pain in her right thumb, sometimes radiating up the thumb but not into the arm. Being right-handed, she has difficulty opening jars or turning doorknobs.  She has a history of endometrial ablation for endometriosis, with improved menstrual cycles since the procedure. She has two children, aged five and ten.  She is employed in [employer] and describes her work as sometimes stressful. She is currently seeing a therapist but has not discussed her recent feelings of being overwhelmed.  She has a family history of diabetes on both her mother's and father's sides. She takes vitamin D  in gel cap form.   Hypertension This is a chronic problem. The current episode started more than 1 year ago. The problem has been gradually improving since onset. The problem is uncontrolled. Associated symptoms include anxiety. Pertinent negatives include no chest pain, headaches, palpitations or shortness of breath. Past treatments include angiotensin blockers. The current treatment provides moderate improvement. Hypertensive end-organ damage includes heart failure.     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   Seasonal allergies    Ulcer, stomach peptic    UTI (lower urinary tract infection)    Wears glasses      Family History  Problem Relation Age  of Onset   Hypertension Mother    Hyperlipidemia Mother    Heart disease Mother    Kidney disease Mother    Diabetes Mother    Hypertension Father    Stroke Father    Diabetes Father    Hypertension Brother    Breast cancer Maternal Aunt    Lung cancer Maternal Uncle    Prostate cancer Maternal Grandmother     Prostate cancer Maternal Grandfather    Alzheimer's disease Paternal Grandmother      Current Outpatient Medications:    Cholecalciferol (VITAMIN D3) 125 MCG (5000 UT) TABS, Take 5,000 Units by mouth daily at 12 noon., Disp: , Rfl:    clobetasol  (TEMOVATE ) 0.05 % external solution, Apply 1 Application topically 2 (two) times daily. Apply twice daily as needed for flares, Disp: 50 mL, Rfl: 2   clobetasol  ointment (TEMOVATE ) 0.05 %, Apply 1 Application topically 2 (two) times daily. Apply to body BID for two weeks on then two weeks off, Disp: 60 g, Rfl: 2   furosemide  (LASIX ) 20 MG tablet, Take 1 tablet (20 mg total) by mouth daily., Disp: 90 tablet, Rfl: 3   ibuprofen  (ADVIL ) 600 MG tablet, Take 1 tablet (600 mg total) by mouth every 6 (six) hours as needed for moderate pain (pain score 4-6) or cramping., Disp: 30 tablet, Rfl: 0   metoprolol  succinate (TOPROL -XL) 100 MG 24 hr tablet, Take 1 tablet (100 mg total) by mouth daily. TAKE WITH OR IMMEDIATELY FOLLOWING A MEAL., Disp: 90 tablet, Rfl: 3   sacubitril -valsartan  (ENTRESTO ) 49-51 MG, Take 1 tablet by mouth 2 (two) times daily., Disp: 180 tablet, Rfl: 3   spironolactone  (ALDACTONE ) 25 MG tablet, Take 0.5 tablets (12.5 mg total) by mouth daily., Disp: 45 tablet, Rfl: 3   tacrolimus  (PROTOPIC ) 0.1 % ointment, Apply topically 2 (two) times daily. Apply to face and affected areas on body twice daily for 2 weeks then stop. Restart in 2 weeks, Disp: 100 g, Rfl: 2   valACYclovir  (VALTREX ) 1000 MG tablet, Take 1 tablet by mouth daily as needed (Cold sores)., Disp: , Rfl:    triamcinolone  cream (KENALOG ) 0.1 %, APPLY TO AFFECTED AREA TWICE DAILY AS NEEDED (Patient not taking: Reported on 05/11/2023), Disp: 45 g, Rfl: 0   Ubrogepant  (UBRELVY ) 50 MG TABS, Take 1 tablet (50 mg total) by mouth daily. (Patient not taking: Reported on 05/11/2023), Disp: 16 tablet, Rfl:    No Known Allergies   Review of Systems  Constitutional: Negative.   HENT: Negative.     Respiratory: Negative.  Negative for shortness of breath.   Cardiovascular: Negative.  Negative for chest pain, palpitations and leg swelling.  Gastrointestinal:  Positive for nausea and vomiting.  Neurological: Negative.  Negative for headaches.  Psychiatric/Behavioral: Negative.       Today's Vitals   10/27/23 1012 10/27/23 1125 10/27/23 1156  BP: (!) 142/120 (!) 130/98 (!) 130/100  Pulse: 71    Temp: 98.6 F (37 C)    SpO2: 98%    Weight: 177 lb 6.4 oz (80.5 kg)    Height: 5\' 2"  (1.575 m)     Body mass index is 32.45 kg/m.  Wt Readings from Last 3 Encounters:  10/27/23 177 lb 6.4 oz (80.5 kg)  08/23/23 179 lb (81.2 kg)  05/17/23 185 lb (83.9 kg)     Objective:  Physical Exam Constitutional:      General: She is not in acute distress.    Appearance: Normal appearance. She is well-developed. She  is obese.  HENT:     Head: Normocephalic and atraumatic.     Right Ear: Hearing, ear canal and external ear normal. There is impacted cerumen.     Left Ear: Hearing, tympanic membrane, ear canal and external ear normal. There is no impacted cerumen.  Eyes:     General: Lids are normal.     Extraocular Movements: Extraocular movements intact.     Conjunctiva/sclera: Conjunctivae normal.     Pupils: Pupils are equal, round, and reactive to light.     Funduscopic exam:    Right eye: No papilledema.        Left eye: No papilledema.  Neck:     Thyroid : No thyroid  mass.     Vascular: No carotid bruit.  Cardiovascular:     Rate and Rhythm: Normal rate and regular rhythm.     Pulses: Normal pulses.     Heart sounds: Normal heart sounds. No murmur heard. Pulmonary:     Effort: Pulmonary effort is normal.     Breath sounds: Normal breath sounds.  Chest:     Chest wall: No mass.  Breasts:    Tanner Score is 5.     Right: Normal. No mass or tenderness.     Left: Normal. No mass or tenderness.  Abdominal:     General: Abdomen is flat. Bowel sounds are normal. There is no  distension.     Palpations: Abdomen is soft.     Tenderness: There is no abdominal tenderness.  Genitourinary:    Comments: Deferred  Musculoskeletal:        General: No swelling. Normal range of motion.     Cervical back: Full passive range of motion without pain, normal range of motion and neck supple.     Right lower leg: No edema.     Left lower leg: No edema.     Comments: Neg Tinels, Phalens  Lymphadenopathy:     Upper Body:     Right upper body: No supraclavicular, axillary or pectoral adenopathy.     Left upper body: No supraclavicular, axillary or pectoral adenopathy.  Skin:    General: Skin is warm and dry.     Capillary Refill: Capillary refill takes less than 2 seconds.  Neurological:     General: No focal deficit present.     Mental Status: She is alert and oriented to person, place, and time.     Cranial Nerves: No cranial nerve deficit.     Sensory: No sensory deficit.  Psychiatric:        Mood and Affect: Mood normal.        Behavior: Behavior normal.        Thought Content: Thought content normal.        Judgment: Judgment normal.         Assessment And Plan:  Annual physical exam Assessment & Plan: A full exam was performed.  Importance of monthly self breast exams was discussed with the patient.  She is advised to get 30-45 minutes of regular exercise, no less than four to five days per week. Both weight-bearing and aerobic exercises are recommended.  She is advised to follow a healthy diet with at least six fruits/veggies per day, decrease intake of red meat and other saturated fats and to increase fish intake to twice weekly.  Meats/fish should not be fried -- baked, boiled or broiled is preferable. It is also important to cut back on your sugar intake.  Be sure to read  labels - try to avoid anything with added sugar, high fructose corn syrup or other sweeteners.  If you must use a sweetener, you can try stevia or monkfruit.  It is also important to avoid  artificially sweetened foods/beverages and diet drinks. Lastly, wear SPF 50 sunscreen on exposed skin and when in direct sunlight for an extended period of time.  Be sure to avoid fast food restaurants and aim for at least 60 ounces of water daily.      Orders: -     CBC -     CMP14+EGFR -     Lipid panel -     TSH  Hypertensive heart disease with chronic systolic congestive heart failure (HCC) Assessment & Plan: Chronic, uncontrolled due to admitted non-compliance.  EKG performed, NSR w/ voltage criteria for LVH, Inferior infarct -age undetermined -Old anterior infarct.  -Anterolateral ST-elevation -nondiagnostic -consider injury.  She does not wish to go to ER for further evaluation. She did have her home meds, she did take them, will continue to monitor her blood pressure. Poorly controlled due to inconsistent medication adherence. Blood pressure 142/120 mmHg. Emphasized adherence to prevent complications. - Recheck blood pressure after rest and EKG. - Unfortunately, was not able to repeat EKG due to software malfunction - Encourage consistent medication/diet adherence. - Adjust medication timing: take furosemide  and Entresto  in the morning, other medications at night. - Avoid regular use of ibuprofen . - She will rto in one week for NV bp check - Go to ER if cp/sob/palpitations/diaphoresis develops   Orders: -     EKG 12-Lead  Bilious vomiting with nausea Assessment & Plan: She was given phenergan  12.5mg  IM x 1. Will send rx Zofran  if her sx persist.   Orders: -     Promethazine  HCl  Thumb paresthesia, right Assessment & Plan: Symptoms suggestive of carpal tunnel syndrome affecting daily activities. Discussed monitoring and potential need for further evaluation. - Monitor symptoms and consider further evaluation if symptoms persist or worsen.   Other migraine without status migrainosus, not intractable Assessment & Plan: Chronic, she was given sample of Ubrelvy  to take prn.  Reminded to avoid known triggers. Would likely benefit from magnesium  supplementation.    Vitamin D  deficiency disease Assessment & Plan: I WILL CHECK A VIT D LEVEL AND SUPPLEMENT AS NEEDED.  ALSO ENCOURAGED TO SPEND 15 MINUTES IN THE SUN DAILY.   Orders: -     VITAMIN D  25 Hydroxy (Vit-D Deficiency, Fractures)  Class 1 obesity due to excess calories with serious comorbidity and body mass index (BMI) of 32.0 to 32.9 in adult Assessment & Plan: She is encouraged to strive for BMI less than 30 to decrease cardiac risk. Advised to aim for at least 150 minutes of exercise per week.    Family history of diabetes mellitus (DM) -     Hemoglobin A1c    Return in 1 week (on 11/03/2023), or NV - bp check and right ear flush, for 1 year physical, 6 month bp.  Patient was given opportunity to ask questions. Patient verbalized understanding of the plan and was able to repeat key elements of the plan. All questions were answered to their satisfaction.   ADDENDUM: pt was contacted by phone later in the day, she was home doing fine. NO further n/v - sleeping. She did eat without a recurrence of vomiting. Husband stated her BP had improved.   I, Meredith Dung, MD, have reviewed all documentation for this visit. The documentation on 10/27/23  for the exam, diagnosis, procedures, and orders are all accurate and complete.   IF YOU HAVE BEEN REFERRED TO A SPECIALIST, IT MAY TAKE 1-2 WEEKS TO SCHEDULE/PROCESS THE REFERRAL. IF YOU HAVE NOT HEARD FROM US /SPECIALIST IN TWO WEEKS, PLEASE GIVE US  A CALL AT [phone] X [phone].   THE PATIENT IS ENCOURAGED TO PRACTICE SOCIAL DISTANCING DUE TO THE COVID-19 PANDEMIC.

## 2023-10-28 LAB — CBC
Hematocrit: 48.5 % — ABNORMAL HIGH (ref 34.0–46.6)
Hemoglobin: 15.3 g/dL (ref 11.1–15.9)
MCH: 29.9 pg (ref 26.6–33.0)
MCHC: 31.5 g/dL (ref 31.5–35.7)
MCV: 95 fL (ref 79–97)
Platelets: 214 10*3/uL (ref 150–450)
RBC: 5.12 x10E6/uL (ref 3.77–5.28)
RDW: 12.2 % (ref 11.7–15.4)
WBC: 8.7 10*3/uL (ref 3.4–10.8)

## 2023-10-28 LAB — CMP14+EGFR
ALT: 18 IU/L (ref 0–32)
AST: 26 IU/L (ref 0–40)
Albumin: 4.8 g/dL (ref 3.9–4.9)
Alkaline Phosphatase: 94 IU/L (ref 44–121)
BUN/Creatinine Ratio: 13 (ref 9–23)
BUN: 12 mg/dL (ref 6–20)
Bilirubin Total: 0.6 mg/dL (ref 0.0–1.2)
CO2: 20 mmol/L (ref 20–29)
Calcium: 9.9 mg/dL (ref 8.7–10.2)
Chloride: 96 mmol/L (ref 96–106)
Creatinine, Ser: 0.91 mg/dL (ref 0.57–1.00)
Globulin, Total: 3.5 g/dL (ref 1.5–4.5)
Glucose: 73 mg/dL (ref 70–99)
Potassium: 4.6 mmol/L (ref 3.5–5.2)
Sodium: 138 mmol/L (ref 134–144)
Total Protein: 8.3 g/dL (ref 6.0–8.5)
eGFR: 82 mL/min/{1.73_m2} (ref >59)

## 2023-10-28 LAB — LIPID PANEL
Chol/HDL Ratio: 3.4 ratio (ref 0.0–4.4)
Cholesterol, Total: 158 mg/dL (ref 100–199)
HDL: 46 mg/dL (ref >39)
LDL Chol Calc (NIH): 98 mg/dL (ref 0–99)
Triglycerides: 71 mg/dL (ref 0–149)
VLDL Cholesterol Cal: 14 mg/dL (ref 5–40)

## 2023-10-28 LAB — VITAMIN D 25 HYDROXY (VIT D DEFICIENCY, FRACTURES): Vit D, 25-Hydroxy: 27.2 ng/mL — ABNORMAL LOW (ref 30.0–100.0)

## 2023-10-28 LAB — TSH: TSH: 0.976 u[IU]/mL (ref 0.450–4.500)

## 2023-10-28 LAB — HEMOGLOBIN A1C
Est. average glucose Bld gHb Est-mCnc: 97 mg/dL
Hgb A1c MFr Bld: 5 % (ref 4.8–5.6)

## 2023-10-31 DIAGNOSIS — E559 Vitamin D deficiency, unspecified: Secondary | ICD-10-CM | POA: Insufficient documentation

## 2023-10-31 NOTE — Assessment & Plan Note (Addendum)
 Chronic, uncontrolled due to admitted non-compliance.  EKG performed, NSR w/ voltage criteria for LVH, Inferior infarct -age undetermined -Old anterior infarct.  -Anterolateral ST-elevation -nondiagnostic -consider injury.  She does not wish to go to ER for further evaluation. She did have her home meds, she did take them, will continue to monitor her blood pressure. Poorly controlled due to inconsistent medication adherence. Blood pressure 142/120 mmHg. Emphasized adherence to prevent complications. - Recheck blood pressure after rest and EKG. - Unfortunately, was not able to repeat EKG due to software malfunction - Encourage consistent medication/diet adherence. - Adjust medication timing: take furosemide  and Entresto  in the morning, other medications at night. - Avoid regular use of ibuprofen . - She will rto in one week for NV bp check - Go to ER if cp/sob/palpitations/diaphoresis develops

## 2023-10-31 NOTE — Assessment & Plan Note (Signed)
 I WILL CHECK A VIT D LEVEL AND SUPPLEMENT AS NEEDED.  ALSO ENCOURAGED TO SPEND 15 MINUTES IN THE SUN DAILY.

## 2023-10-31 NOTE — Assessment & Plan Note (Signed)
 Symptoms suggestive of carpal tunnel syndrome affecting daily activities. Discussed monitoring and potential need for further evaluation. - Monitor symptoms and consider further evaluation if symptoms persist or worsen.

## 2023-10-31 NOTE — Assessment & Plan Note (Signed)
 She was given phenergan  12.5mg  IM x 1. Will send rx Zofran  if her sx persist.

## 2023-10-31 NOTE — Assessment & Plan Note (Signed)
 She is encouraged to strive for BMI less than 30 to decrease cardiac risk. Advised to aim for at least 150 minutes of exercise per week.

## 2023-10-31 NOTE — Assessment & Plan Note (Signed)
 Chronic, she was given sample of Ubrelvy  to take prn. Reminded to avoid known triggers. Would likely benefit from magnesium  supplementation.

## 2023-11-01 ENCOUNTER — Encounter: Payer: Self-pay | Admitting: Internal Medicine

## 2023-11-02 ENCOUNTER — Ambulatory Visit

## 2023-11-02 VITALS — BP 142/102 | HR 79 | Temp 98.1°F | Ht 62.0 in | Wt 177.0 lb

## 2023-11-02 DIAGNOSIS — H6121 Impacted cerumen, right ear: Secondary | ICD-10-CM

## 2023-11-02 DIAGNOSIS — I110 Hypertensive heart disease with heart failure: Secondary | ICD-10-CM

## 2023-11-02 NOTE — Progress Notes (Signed)
 Patient presents today for a bpc and right ear flushed. Patient reports she takes her entresto  49-51mg  and metoprolol  100mg  in the mornings. She takes the entresto  49-51mg  in the evenings as well. I checked her blood pressure and it was 142/102 P79. I had the patient wait 10 minutes and rechecked and it was 140/110 P84. After speaking with provider patient is advised to call her cardiologist and schedule an appointment with them as soon as possible. She was also advised to continue with her current medications, she is to increase her spironolactone  25mg  half a tablet to 1 whole tablet daily. I discussed the importance of her taking her medications as directed. Patient expressed understanding. Patient is to come back in 2 weeks for a NV BPC/BMP. Patient's right ear has been flushed.     BP Readings from Last 3 Encounters:  11/02/23 (!) 142/102  10/27/23 (!) 130/100  08/23/23 (!) 134/92

## 2023-11-08 ENCOUNTER — Ambulatory Visit: Admitting: Dermatology

## 2023-11-08 VITALS — BP 139/98

## 2023-11-08 DIAGNOSIS — L409 Psoriasis, unspecified: Secondary | ICD-10-CM | POA: Diagnosis not present

## 2023-11-08 MED ORDER — ZORYVE 0.3 % EX CREA: 1.0000 | TOPICAL_CREAM | Freq: Every day | CUTANEOUS | 5 refills | Status: AC

## 2023-11-08 NOTE — Progress Notes (Unsigned)
   Follow-Up Visit   Subjective  Meredith Velez is a 38 y.o. female who presents for the following: Psoriasis  Patient present today for follow up visit for Psoriasis. Patient was last evaluated on 06/10/23. At this visit patient was prescribed Sotyktu . Unfortunately the pharmacy was unable to reach pt to schedule delivery so pt hasn't started therapy. She did not get her labs done. She takes several pills now and she was not sure she wanted to add another one. Patient reports she is currently using Clobetasol  about every other week and she only uses it on flared areas of her body. She uses tacrolimus  on her face daily and flares if she does not use it. She is using DHS zinc on hair wash days. She does not have any flared areas today. Patient reports sxs are better. Patient denies medication changes.  The following portions of the chart were reviewed this encounter and updated as appropriate: medications, allergies, medical history  Review of Systems:  No other skin or systemic complaints except as noted in HPI or Assessment and Plan.  Objective  Well appearing patient in no apparent distress; mood and affect are within normal limits.   A focused examination was performed of the following areas:   Relevant exam findings are noted in the Assessment and Plan.    Assessment & Plan   PSORIASIS ] - Assessment: Patient's psoriasis has been well-controlled with topical treatments since December. Experienced a mild flare after beach exposure but no severe flare like in March of the previous year. Arms, previously affected, have shown improvement. Current treatment of alternating tacrolimus  and clobetasol  has been effective in managing symptoms.  - Plan:    Discontinue alternating tacrolimus  and clobetasol  regimen    Start Zoryve (roflumilast) cream daily:     - Apply thin layer to affected areas, including face, groin, underarms, and elbows     - Patient educated on application  technique (pea-sized amount for face)     - Samples provided     - Prescription sent to [facility] pharmacy with diagnosis and prior treatment failure noted    Continue current facial routine with CeraVe products    Patient education:     - Apply sunscreen after 15 minutes of unprotected sun exposure, reapply every 3-4 hours, especially at the beach     - Zoryve can be used with lotion if desired, but not necessary    Follow up as needed   Return in about 6 months (around 05/10/2024) for Psoriasis.  I, Eliot Guernsey, CMA, am acting as scribe for [employer], DO .   Documentation: I have reviewed the above documentation for accuracy and completeness, and I agree with the above.  Louana Roup, DO

## 2023-11-08 NOTE — Patient Instructions (Addendum)
 Hello Meredith Velez,  Thank you for visiting today. Here is a summary of the key instructions:  - Zoryve cream:   - Apply a thin layer daily to affected areas   - Can use on face, groin, underarms, and elbows   - Use a pea-sized amount for the face   - Can use with lotion if desired  - Skin Care:   - Continue using CeraVe products for facial care   - Apply sunscreen after 15 minutes of sun exposure   - Reapply sunscreen every 3-4 hours, especially at the beach  - Pharmacy Information:   -  Zoryve prescription was sent to [facility] pharmacy --> they will mail it to you   - Follow-up: Contact the office if you need anything or have questions  Please reach out if you have any questions or concerns.  Warm regards,  Dr. Louana Roup, Dermatology    Your prescription was sent to [facility] in [place]. A representative from [facility] will contact you within 3 business hours to verify your address and insurance information to schedule a free delivery. If for any reason you do not receive a phone call from them, please reach out to them. Their phone number is [phone] and their hours are Monday-Friday 9:00 am-5:00 pm.      Important Information  Due to recent changes in healthcare laws, you may see results of your pathology and/or laboratory studies on MyChart before the doctors have had a chance to review them. We understand that in some cases there may be results that are confusing or concerning to you. Please understand that not all results are received at the same time and often the doctors may need to interpret multiple results in order to provide you with the best plan of care or course of treatment. Therefore, we ask that you please give us  2 business days to thoroughly review all your results before contacting the office for clarification. Should we see a critical lab result, you will be contacted sooner.   If You Need Anything After Your Visit  If you have any  questions or concerns for your doctor, please call our main line at [phone] If no one answers, please leave a voicemail as directed and we will return your call as soon as possible. Messages left after 4 pm will be answered the following business day.   You may also send us  a message via MyChart. We typically respond to MyChart messages within 1-2 business days.  For prescription refills, please ask your pharmacy to contact our office. Our fax number is [phone].  If you have an urgent issue when the clinic is closed that cannot wait until the next business day, you can page your doctor at the number below.    Please note that while we do our best to be available for urgent issues outside of office hours, we are not available 24/7.   If you have an urgent issue and are unable to reach us , you may choose to seek medical care at your doctor's office, retail clinic, urgent care center, or emergency room.  If you have a medical emergency, please immediately call 911 or go to the emergency department. In the event of inclement weather, please call our main line at [phone] for an update on the status of any delays or closures.  Dermatology Medication Tips: Please keep the boxes that topical medications come in in order to help keep track of the instructions about where and how to  use these. Pharmacies typically print the medication instructions only on the boxes and not directly on the medication tubes.   If your medication is too expensive, please contact our office at [phone] or send us  a message through MyChart.   We are unable to tell what your co-pay for medications will be in advance as this is different depending on your insurance coverage. However, we may be able to find a substitute medication at lower cost or fill out paperwork to get insurance to cover a needed medication.   If a prior authorization is required to get your medication covered by your insurance company,  please allow us  1-2 business days to complete this process.  Drug prices often vary depending on where the prescription is filled and some pharmacies may offer cheaper prices.  The website www.goodrx.com contains coupons for medications through different pharmacies. The prices here do not account for what the cost may be with help from insurance (it may be cheaper with your insurance), but the website can give you the price if you did not use any insurance.  - You can print the associated coupon and take it with your prescription to the pharmacy.  - You may also stop by our office during regular business hours and pick up a GoodRx coupon card.  - If you need your prescription sent electronically to a different pharmacy, notify our office through [facility] or by phone at [phone]

## 2023-11-09 ENCOUNTER — Encounter: Payer: Self-pay | Admitting: Dermatology

## 2023-11-12 ENCOUNTER — Encounter (HOSPITAL_COMMUNITY): Payer: Self-pay

## 2023-11-19 ENCOUNTER — Ambulatory Visit

## 2023-11-19 VITALS — BP 126/84 | HR 75 | Temp 98.1°F | Ht 62.0 in | Wt 177.0 lb

## 2023-11-19 DIAGNOSIS — E66811 Obesity, class 1: Secondary | ICD-10-CM

## 2023-11-19 DIAGNOSIS — E6609 Other obesity due to excess calories: Secondary | ICD-10-CM

## 2023-11-19 DIAGNOSIS — I5022 Chronic systolic (congestive) heart failure: Secondary | ICD-10-CM

## 2023-11-19 DIAGNOSIS — I110 Hypertensive heart disease with heart failure: Secondary | ICD-10-CM

## 2023-11-19 NOTE — Progress Notes (Signed)
 Patient is in office today for a nurse visit for Blood Pressure Check. Also to complete BMP. Patient takes entresto  49-51mg  she reports taking in the one in the morning & one at night, metoprolol  100mg  she reports taking in the morning & spironolactone  25mg  she reports taking in the morning. Denies headache, chest pain & sob. BP Readings from Last 3 Encounters:  11/19/23 126/84  11/08/23 (!) 139/98  11/02/23 (!) 142/102  Per provider patient is to continue with current medications. Also schedule follow up with cardiology. Patient aware.

## 2023-11-19 NOTE — Patient Instructions (Signed)
 Hypertension, Adult Hypertension is another name for high blood pressure. High blood pressure forces your heart to work harder to pump blood. This can cause problems over time. There are two numbers in a blood pressure reading. There is a top number (systolic) over a bottom number (diastolic). It is best to have a blood pressure that is below 120/80. What are the causes? The cause of this condition is not known. Some other conditions can lead to high blood pressure. What increases the risk? Some lifestyle factors can make you more likely to develop high blood pressure: Smoking. Not getting enough exercise or physical activity. Being overweight. Having too much fat, sugar, calories, or salt (sodium) in your diet. Drinking too much alcohol. Other risk factors include: Having any of these conditions: Heart disease. Diabetes. High cholesterol. Kidney disease. Obstructive sleep apnea. Having a family history of high blood pressure and high cholesterol. Age. The risk increases with age. Stress. What are the signs or symptoms? High blood pressure may not cause symptoms. Very high blood pressure (hypertensive crisis) may cause: Headache. Fast or uneven heartbeats (palpitations). Shortness of breath. Nosebleed. Vomiting or feeling like you may vomit (nauseous). Changes in how you see. Very bad chest pain. Feeling dizzy. Seizures. How is this treated? This condition is treated by making healthy lifestyle changes, such as: Eating healthy foods. Exercising more. Drinking less alcohol. Your doctor may prescribe medicine if lifestyle changes do not help enough and if: Your top number is above 130. Your bottom number is above 80. Your personal target blood pressure may vary. Follow these instructions at home: Eating and drinking  If told, follow the DASH eating plan. To follow this plan: Fill one half of your plate at each meal with fruits and vegetables. Fill one fourth of your plate  at each meal with whole grains. Whole grains include whole-wheat pasta, brown rice, and whole-grain bread. Eat or drink low-fat dairy products, such as skim milk or low-fat yogurt. Fill one fourth of your plate at each meal with low-fat (lean) proteins. Low-fat proteins include fish, chicken without skin, eggs, beans, and tofu. Avoid fatty meat, cured and processed meat, or chicken with skin. Avoid pre-made or processed food. Limit the amount of salt in your diet to less than 1,500 mg each day. Do not drink alcohol if: Your doctor tells you not to drink. You are pregnant, may be pregnant, or are planning to become pregnant. If you drink alcohol: Limit how much you have to: 0-1 drink a day for women. 0-2 drinks a day for men. Know how much alcohol is in your drink. In the [place] one drink equals one 12 oz bottle of beer (355 mL), one 5 oz glass of wine (148 mL), or one 1 oz glass of hard liquor (44 mL). Lifestyle  Work with your doctor to stay at a healthy weight or to lose weight. Ask your doctor what the best weight is for you. Get at least 30 minutes of exercise that causes your heart to beat faster (aerobic exercise) most days of the week. This may include walking, swimming, or biking. Get at least 30 minutes of exercise that strengthens your muscles (resistance exercise) at least 3 days a week. This may include lifting weights or doing Pilates. Do not smoke or use any products that contain nicotine or tobacco. If you need help quitting, ask your doctor. Check your blood pressure at home as told by your doctor. Keep all follow-up visits. Medicines Take over-the-counter and prescription medicines  only as told by your doctor. Follow directions carefully. Do not skip doses of blood pressure medicine. The medicine does not work as well if you skip doses. Skipping doses also puts you at risk for problems. Ask your doctor about side effects or reactions to medicines that you should watch  for. Contact a doctor if: You think you are having a reaction to the medicine you are taking. You have headaches that keep coming back. You feel dizzy. You have swelling in your ankles. You have trouble with your vision. Get help right away if: You get a very bad headache. You start to feel mixed up (confused). You feel weak or numb. You feel faint. You have very bad pain in your: Chest. Belly (abdomen). You vomit more than once. You have trouble breathing. These symptoms may be an emergency. Get help right away. Call 911. Do not wait to see if the symptoms will go away. Do not drive yourself to the hospital. Summary Hypertension is another name for high blood pressure. High blood pressure forces your heart to work harder to pump blood. For most people, a normal blood pressure is less than 120/80. Making healthy choices can help lower blood pressure. If your blood pressure does not get lower with healthy choices, you may need to take medicine. This information is not intended to replace advice given to you by your health care provider. Make sure you discuss any questions you have with your health care provider. Document Revised: 04/10/2021 Document Reviewed: 04/10/2021 Elsevier Patient Education  2024 [employer].

## 2023-11-20 ENCOUNTER — Ambulatory Visit: Payer: Self-pay | Admitting: Internal Medicine

## 2023-11-20 LAB — BMP8+EGFR
BUN/Creatinine Ratio: 14 (ref 9–23)
BUN: 13 mg/dL (ref 6–20)
CO2: 22 mmol/L (ref 20–29)
Calcium: 10.1 mg/dL (ref 8.7–10.2)
Chloride: 101 mmol/L (ref 96–106)
Creatinine, Ser: 0.96 mg/dL (ref 0.57–1.00)
Glucose: 79 mg/dL (ref 70–99)
Potassium: 4.9 mmol/L (ref 3.5–5.2)
Sodium: 141 mmol/L (ref 134–144)
eGFR: 77 mL/min/{1.73_m2} (ref >59)

## 2023-12-03 ENCOUNTER — Ambulatory Visit: Attending: General Practice | Admitting: General Practice

## 2023-12-03 ENCOUNTER — Encounter: Payer: Self-pay | Admitting: General Practice

## 2023-12-03 VITALS — BP 130/95 | HR 84 | Ht 62.0 in | Wt 178.6 lb

## 2023-12-03 DIAGNOSIS — I10 Essential (primary) hypertension: Secondary | ICD-10-CM | POA: Diagnosis not present

## 2023-12-03 DIAGNOSIS — I5022 Chronic systolic (congestive) heart failure: Secondary | ICD-10-CM | POA: Diagnosis not present

## 2023-12-03 DIAGNOSIS — R0609 Other forms of dyspnea: Secondary | ICD-10-CM | POA: Diagnosis not present

## 2023-12-03 MED ORDER — SPIRONOLACTONE 25 MG PO TABS
25.0000 mg | ORAL_TABLET | Freq: Every day | ORAL | 3 refills | Status: AC
Start: 2023-12-03 — End: ?

## 2023-12-03 MED ORDER — AMLODIPINE BESYLATE 5 MG PO TABS: 2.5000 mg | ORAL_TABLET | Freq: Every day | ORAL | 3 refills | Status: AC

## 2023-12-03 NOTE — Patient Instructions (Signed)
 Medication Instructions:  The patient is currently on no regular medications.  *If you need a refill on your cardiac medications before your next appointment, please call your pharmacy*   Lab Work: No labs were ordered during today's visit.  If you have labs (blood work) drawn today and your tests are completely normal, you will receive your results only by: MyChart Message (if you have MyChart) OR A paper copy in the mail If you have any lab test that is abnormal or we need to change your treatment, we will call you to review the results.   Testing/Procedures: Your physician has requested that you have an echocardiogram. Echocardiography is a painless test that uses sound waves to create images of your heart. It provides your doctor with information about the size and shape of your heart and how well your heart's chambers and valves are working. This procedure takes approximately one hour. There are no restrictions for this procedure. Please do NOT wear cologne, perfume, aftershave, or lotions (deodorant is allowed). Please arrive 15 minutes prior to your appointment time.  Please note: We ask at that you not bring children with you during ultrasound (echo/ vascular) testing. Due to room size and safety concerns, children are not allowed in the ultrasound rooms during exams. Our front office staff cannot provide observation of children in our lobby area while testing is being conducted. An adult accompanying a patient to their appointment will only be allowed in the ultrasound room at the discretion of the ultrasound technician under special circumstances. We apologize for any inconvenience.    Follow-Up: At [facility], you and your health needs are our priority.  As part of our continuing mission to provide you with exceptional heart care, we have created designated Provider Care Teams.  These Care Teams include your primary Cardiologist (physician) and Advanced Practice Providers  (APPs -  Physician Assistants and Nurse Practitioners) who all work together to provide you with the care you need, when you need it.  We recommend signing up for the patient portal called "MyChart".  Sign up information is provided on this After Visit Summary.  MyChart is used to connect with patients for Virtual Visits (Telemedicine).  Patients are able to view lab/test results, encounter notes, upcoming appointments, etc.  Non-urgent messages can be sent to your provider as well.   To learn more about what you can do with MyChart, go to [email or web address].    Your next appointment:   2 - 3 month(s)  Provider:   Lawana Pray, NP          Other Instructions Thank you for choosing [facility]!

## 2023-12-03 NOTE — Progress Notes (Signed)
 Cardiology Clinic Note   Patient Name: Meredith Velez Date of Encounter: 12/03/2023  Primary Care Provider:  Cleave Curling, MD Primary Cardiologist:  Dorothye Gathers, MD  Patient Profile    Meredith Velez 38 year old female presents the clinic today for follow-up evaluation of her hypertension.  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   Seasonal allergies    Ulcer, stomach peptic    UTI (lower urinary tract infection)    Wears glasses    Past Surgical History:  Procedure Laterality Date   COLONOSCOPY     DILITATION & CURRETTAGE/HYSTROSCOPY WITH HYDROTHERMAL ABLATION N/A 05/17/2023   Procedure: DILATATION & CURETTAGE/HYSTEROSCOPY WITH HYDROTHERMAL ABLATION;  Surgeon: Renea Carrion, MD;  Location: [facility] OR;  Service: Gynecology;  Laterality: N/A;   LAPAROSCOPIC BILATERAL SALPINGECTOMY Bilateral 01/04/2019   Procedure: LAPAROSCOPIC BILATERAL SALPINGECTOMY;  Surgeon: Renea Carrion, MD;  Location: [facility];  Service: Gynecology;  Laterality: Bilateral;    Allergies  No Known Allergies  History of Present Illness    Meredith Velez has a PMH of HTN, anxiety, depression, and peripartum cardiomyopathy.  She was hospitalized in 2020.  Her initial ejection fraction was 20-25%.  She was placed on Entresto  and metoprolol  succinate.  Her repeat echocardiogram showed EF of 45%.  She expressed understanding that she would not be able to take Entresto  while pregnant.  She underwent tubal ligation with her last pregnancy.  Initially she was depressed about her cardiac situation.  [facility] social work helped her significantly with life coaching and patient care.  She participated in the [facility] health care  team education video.  Her blood pressure was well-controlled.  Her daughter runs track and she wanted to be able to walk the track more often.  She was seen in follow-up by Lovette Rud, PA-C on 08/23/2023.  During that time she presented for routine follow-up.  She had been feeling fine.  She was trying to stay physically active.  She was walking regularly at home on her treadmill.  She was cutting back on her soft drink consumption and drink more water.  She noted that her blood pressure had been higher than usual during recent visits.  This was unusual for her.  She had not been checking her blood pressure at home but agreed to start checking her blood pressure daily.  She noted that she had a DNC a year prior and noted that her blood pressure was elevated during that time.  She denies shortness of breath, dyspnea, chest pain, lower extremity swelling, orthopnea and palpitations.  Her PCP contacted the cardiology clinic today.  Patient was added to my schedule for evaluation of her elevated blood pressure.  She presents to the clinic today for follow-up evaluation and states she has not been feeling well for several days.  She notices that for the last 2 weeks she has not been able to exercise regularly.  She notices increased work of breathing with increased physical activity.  She also notices that her blood pressure has been steadily increasing.  Her blood pressure today is 130/98.  She had recent blood work done at her PCP which shows stable electrolytes and renal function.  She denies bleeding issues.  We reviewed her previous cardiac history.  She expressed understanding.  She notes that she did have recent travel to [place]  with her husband.  She was in the [place] area.  I will start amlodipine  2.5 mg daily, refill her spironolactone , and order echocardiogram for further prognostication.  Will plan follow-up in 2 to 3 months..  Today she denies chest pain, lower extremity edema, fatigue,  palpitations, melena, hematuria, hemoptysis, diaphoresis, weakness, presyncope, syncope, orthopnea, and PND.     Home Medications    Prior to Admission medications   Medication Sig Start Date End Date Taking? Authorizing Provider  Cholecalciferol (VITAMIN D3) 125 MCG (5000 UT) TABS Take 5,000 Units by mouth daily at 12 noon.    [provider]  clobetasol  (TEMOVATE ) 0.05 % external solution Apply 1 Application topically 2 (two) times daily. Apply twice daily as needed for flares 01/27/23   Dellar Fenton, DO  clobetasol  ointment (TEMOVATE ) 0.05 % Apply 1 Application topically 2 (two) times daily. Apply to body BID for two weeks on then two weeks off 10/28/22   Dellar Fenton, DO  furosemide  (LASIX ) 20 MG tablet Take 1 tablet (20 mg total) by mouth daily. 08/23/23   Conte, Tessa N, PA-C  ibuprofen  (ADVIL ) 600 MG tablet Take 1 tablet (600 mg total) by mouth every 6 (six) hours as needed for moderate pain (pain score 4-6) or cramping. 05/17/23   Renea Carrion, MD  metoprolol  succinate (TOPROL -XL) 100 MG 24 hr tablet Take 1 tablet (100 mg total) by mouth daily. TAKE WITH OR IMMEDIATELY FOLLOWING A MEAL. 08/23/23   Conte, Tessa N, PA-C  Roflumilast  (ZORYVE ) 0.3 % CREA Apply 1 application  topically daily. 11/08/23   Dellar Fenton, DO  sacubitril -valsartan  (ENTRESTO ) 49-51 MG Take 1 tablet by mouth 2 (two) times daily. 08/23/23   Von Grumbling, PA-C  spironolactone  (ALDACTONE ) 25 MG tablet Take 0.5 tablets (12.5 mg total) by mouth daily. 08/23/23   Conte, Tessa N, PA-C  tacrolimus  (PROTOPIC ) 0.1 % ointment Apply topically 2 (two) times daily. Apply to face and affected areas on body twice daily for 2 weeks then stop. Restart in 2 weeks 10/28/22   Dellar Fenton, DO  triamcinolone  cream (KENALOG ) 0.1 % APPLY TO AFFECTED AREA TWICE DAILY AS NEEDED Patient not taking: Reported on 05/11/2023 08/25/22   Cleave Curling, MD  Ubrogepant  (UBRELVY ) 50 MG TABS Take 1 tablet (50 mg total) by mouth  daily. Patient not taking: Reported on 05/11/2023 01/18/23   Susanna Epley, FNP  valACYclovir  (VALTREX ) 1000 MG tablet Take 1 tablet by mouth daily as needed (Cold sores). 08/27/19   [provider]    Family History    Family History  Problem Relation Age of Onset   Hypertension Mother    Hyperlipidemia Mother    Heart disease Mother    Kidney disease Mother    Diabetes Mother    Hypertension Father    Stroke Father    Diabetes Father    Hypertension Brother    Breast cancer Maternal Aunt    Lung cancer Maternal Uncle    Prostate cancer Maternal Grandmother    Prostate cancer Maternal Grandfather    Alzheimer's disease Paternal Grandmother    She indicated that her mother is alive. She indicated that her father is deceased. She indicated that the status of her brother is unknown. She indicated that the status of her maternal grandmother is unknown. She indicated that the status of her  maternal grandfather is unknown. She indicated that the status of her paternal grandmother is unknown. She indicated that the status of her maternal aunt is unknown. She indicated that the status of her maternal uncle is unknown.  Social History    Social History   Socioeconomic History   Marital status: Married    Spouse name: Not on file   Number of children: Not on file   Years of education: Not on file   Highest education level: Not on file  Occupational History   Occupation: Wellsite geologist: [employer]  Tobacco Use   Smoking status: Never   Smokeless tobacco: Never  Vaping Use   Vaping status: Never Used  Substance and Sexual Activity   Alcohol use: Not Currently    Comment: occ   Drug use: No   Sexual activity: Yes    Birth control/protection: None  Other Topics Concern   Not on file  Social History Narrative   Not on file   Social Drivers of Health   Financial Resource Strain: Not on file  Food Insecurity: No Food Insecurity (04/02/2020)   Hunger  Vital Sign    Worried About Running Out of Food in the Last Year: Never true    Ran Out of Food in the Last Year: Never true  Transportation Needs: No Transportation Needs (04/02/2020)   PRAPARE - Administrator, Civil Service (Medical): No    Lack of Transportation (Non-Medical): No  Physical Activity: Not on file  Stress: Stress Concern Present (04/02/2020)   [employer] of Occupational Health - Occupational Stress Questionnaire    Feeling of Stress : Very much  Social Connections: Not on file  Intimate Partner Violence: Not on file     Review of Systems    General:  No chills, fever, night sweats or weight changes.  Cardiovascular:  No chest pain, dyspnea on exertion, edema, orthopnea, palpitations, paroxysmal nocturnal dyspnea. Dermatological: No rash, lesions/masses Respiratory: No cough, dyspnea Urologic: No hematuria, dysuria Abdominal:   No nausea, vomiting, diarrhea, bright red blood per rectum, melena, or hematemesis Neurologic:  No visual changes, wkns, changes in mental status. All other systems reviewed and are otherwise negative except as noted above.  Physical Exam    VS:  BP (!) 130/95   Pulse 84   Ht 5\' 2"  (1.575 m)   Wt 178 lb 9.6 oz (81 kg)   SpO2 96%   BMI 32.67 kg/m  , BMI Body mass index is 32.67 kg/m. GEN: Well nourished, well developed, in no acute distress. HEENT: normal. Neck: Supple, no JVD, carotid bruits, or masses. Cardiac: RRR, no murmurs, rubs, or gallops. No clubbing, cyanosis, edema.  Radials/DP/PT 2+ and equal bilaterally.  Respiratory:  Respirations regular and unlabored, clear to auscultation bilaterally. GI: Soft, nontender, nondistended, BS + x 4. MS: no deformity or atrophy. Skin: warm and dry, no rash. Neuro:  Strength and sensation are intact. Psych: Normal affect.  Accessory Clinical Findings    Recent Labs: 01/18/2023: BNP 34.3 10/27/2023: ALT 18; Hemoglobin 15.3; Platelets 214; TSH 0.976 11/19/2023: BUN 13;  Creatinine, Ser 0.96; Potassium 4.9; Sodium 141   Recent Lipid Panel    Component Value Date/Time   CHOL 158 10/27/2023 1225   TRIG 71 10/27/2023 1225   HDL 46 10/27/2023 1225   CHOLHDL 3.4 10/27/2023 1225   CHOLHDL 3 05/11/2014 0937   VLDL 5.8 05/11/2014 0937   LDLCALC 98 10/27/2023 1225    HYPERTENSION CONTROL Vitals:  12/03/23 1326 12/03/23 1400  BP: (!) 130/98 (!) 130/95    The patient's blood pressure is elevated above target today.  In order to address the patient's elevated BP: Blood pressure will be monitored at home to determine if medication changes need to be made.; A new medication was prescribed today.       ECG personally reviewed by me today-none today.     Echocardiogram 04/07/2019  Left ventricular ejection fraction, by visual estimation, is 40 to 45%. The left ventricle has moderately decreased function. Mildly increased left ventricular size. There is no left ventricular hypertrophy. 2. Apical window is foreshortened some, making evaluation difficult Compared to echo images from Aug 2020, LVEF appears improved. 3. Global right ventricle has normal systolic function.The right ventricular size is normal. No increase in right ventricular wall thickness   06/24/21 ECHO:   1. Left ventricular ejection fraction, by estimation, is 40 to 45%. The  left ventricle has mildly decreased function. The left ventricle  demonstrates global hypokinesis. Left ventricular diastolic parameters are  consistent with Grade I diastolic  dysfunction (impaired relaxation).   2. Right ventricular systolic function is normal. The right ventricular  size is normal.   3. The mitral valve is normal in structure. Trivial mitral valve  regurgitation. No evidence of mitral stenosis.   4. The aortic valve is normal in structure. Aortic valve regurgitation is  not visualized. No aortic stenosis is present.   5. The inferior vena cava is normal in size with greater than 50%   respiratory variability, suggesting right atrial pressure of 3 mmHg.  Echocardiogram 09/16/2023  IMPRESSIONS     1. Left ventricular ejection fraction, by estimation, is 45 to 50%. Left  ventricular ejection fraction by 3D volume is 46 %. The left ventricle has  mildly decreased function. The left ventricle demonstrates global  hypokinesis. There is mild concentric  left ventricular hypertrophy. Left ventricular diastolic parameters are  consistent with Grade I diastolic dysfunction (impaired relaxation). The  average left ventricular global longitudinal strain is -9.7 %.   2. Right ventricular systolic function is normal. The right ventricular  size is normal.   3. The mitral valve is normal in structure. Trivial mitral valve  regurgitation. No evidence of mitral stenosis.   4. The aortic valve is normal in structure. Aortic valve regurgitation is  not visualized. No aortic stenosis is present.   5. The inferior vena cava is normal in size with greater than 50%  respiratory variability, suggesting right atrial pressure of 3 mmHg.   FINDINGS   Left Ventricle: Left ventricular ejection fraction, by estimation, is 45  to 50%. Left ventricular ejection fraction by 3D volume is 46 %. The left  ventricle has mildly decreased function. The left ventricle demonstrates  global hypokinesis. The average  left ventricular global longitudinal strain is -9.7 %. Strain was  performed and the global longitudinal strain is indeterminate. The left  ventricular internal cavity size was normal in size. There is mild  concentric left ventricular hypertrophy. Left  ventricular diastolic parameters are consistent with Grade I diastolic  dysfunction (impaired relaxation).   Right Ventricle: The right ventricular size is normal. No increase in  right ventricular wall thickness. Right ventricular systolic function is  normal.   Left Atrium: Left atrial size was normal in size.   Right Atrium: Right  atrial size was normal in size.   Pericardium: There is no evidence of pericardial effusion.   Mitral Valve: The mitral valve is normal  in structure. Trivial mitral  valve regurgitation. No evidence of mitral valve stenosis.   Tricuspid Valve: The tricuspid valve is normal in structure. Tricuspid  valve regurgitation is mild . No evidence of tricuspid stenosis.   Aortic Valve: The aortic valve is normal in structure. Aortic valve  regurgitation is not visualized. No aortic stenosis is present.   Pulmonic Valve: The pulmonic valve was normal in structure. Pulmonic valve  regurgitation is trivial. No evidence of pulmonic stenosis.   Aorta: The aortic root is normal in size and structure.   Venous: The inferior vena cava is normal in size with greater than 50%  respiratory variability, suggesting right atrial pressure of 3 mmHg.   IAS/Shunts: No atrial level shunt detected by color flow Doppler.   Additional Comments: 3D was performed not requiring image post processing  on an independent workstation and was abnormal.    Assessment & Plan   1.  Essential hypertension-BP today 130/98. Maintain blood pressure log Heart healthy low-sodium diet Maintain physical activity Continue Entresto , spironolactone , furosemide , metoprolol  Start amlodipine  2.5 mg daily  Chronic systolic CHF, dyspnea with exertion-weight today 178.6.  Euvolemic.  No increased DOE or activity intolerance.  Continues to be somewhat physically active walking at home.  Echocardiogram 09/16/2023 showed an LVEF of 45-50%, mild concentric LVH, G1 DD, trivial mitral valve regurgitation and no other significant valvular abnormalities. Daily weights-maintain weight log Heart healthy low-sodium diet Elevate lower extremities when not active Order echocardiogram  Disposition: Follow-up with Dr. Renna Cary or me in 2-3 months.   Chet Cota. Amillion Scobee NP-C     12/03/2023, 2:00 PM [facility] [address] Office [phone] Fax [phone]    I spent 14 minutes examining this patient, reviewing medications, and using patient centered shared decision making involving their cardiac care.   I spent  20 minutes reviewing past medical history,  medications, and prior cardiac tests.

## 2024-01-17 ENCOUNTER — Other Ambulatory Visit (HOSPITAL_COMMUNITY): Payer: Self-pay

## 2024-01-17 ENCOUNTER — Telehealth: Payer: Self-pay

## 2024-01-17 NOTE — Telephone Encounter (Signed)
 Pharmacy Patient Advocate Encounter   Received notification from Fax that prior authorization for ENTRESTO  is required/requested.   Insurance verification completed.   The patient is insured through [facility] .   Per test claim: PA required; PA submitted to above mentioned insurance via CoverMyMeds Key/confirmation #/EOC [id number] Status is pending

## 2024-01-17 NOTE — Telephone Encounter (Signed)
 Pharmacy Patient Advocate Encounter  Received notification from [employer] that Prior Authorization for ENTRESTO  has been APPROVED from 01/17/24 to 01/16/25. Ran test claim, Copay is $60. This test claim was processed through [facility]- copay amounts may vary at other pharmacies due to pharmacy/plan contracts, or as the patient moves through the different stages of their insurance plan.

## 2024-01-18 ENCOUNTER — Ambulatory Visit (HOSPITAL_COMMUNITY)

## 2024-02-22 ENCOUNTER — Ambulatory Visit (HOSPITAL_COMMUNITY)
Admission: RE | Admit: 2024-02-22 | Discharge: 2024-02-22 | Disposition: A | Discharge status: Home / Self Care | Source: Ambulatory Visit | Attending: General Practice | Admitting: General Practice

## 2024-02-22 ENCOUNTER — Ambulatory Visit: Admitting: Internal Medicine

## 2024-02-22 DIAGNOSIS — I5022 Chronic systolic (congestive) heart failure: Secondary | ICD-10-CM | POA: Diagnosis present

## 2024-02-22 LAB — ECHOCARDIOGRAM COMPLETE
AR max vel: 1.8 cm2
AV Area VTI: 1.99 cm2
AV Area mean vel: 1.82 cm2
AV Mean grad: 3 mmHg
AV Peak grad: 6.8 mmHg
Ao pk vel: 1.3 m/s
Area-P 1/2: 4.15 cm2
Calc EF: 58.4 %
S' Lateral: 3.22 cm
Single Plane A2C EF: 59.6 %
Single Plane A4C EF: 59 %

## 2024-02-22 NOTE — Patient Instructions (Incomplete)
 Hypertension, Adult Hypertension is another name for high blood pressure. High blood pressure forces your heart to work harder to pump blood. This can cause problems over time. There are two numbers in a blood pressure reading. There is a top number (systolic) over a bottom number (diastolic). It is best to have a blood pressure that is below 120/80. What are the causes? The cause of this condition is not known. Some other conditions can lead to high blood pressure. What increases the risk? Some lifestyle factors can make you more likely to develop high blood pressure: Smoking. Not getting enough exercise or physical activity. Being overweight. Having too much fat, sugar, calories, or salt (sodium) in your diet. Drinking too much alcohol . Other risk factors include: Having any of these conditions: Heart disease. Diabetes. High cholesterol. Kidney disease. Obstructive sleep apnea. Having a family history of high blood pressure and high cholesterol. Age. The risk increases with age. Stress. What are the signs or symptoms? High blood pressure may not cause symptoms. Very high blood pressure (hypertensive crisis) may cause: Headache. Fast or uneven heartbeats (palpitations). Shortness of breath. Nosebleed. Vomiting or feeling like you may vomit (nauseous). Changes in how you see. Very bad chest pain. Feeling dizzy. Seizures. How is this treated? This condition is treated by making healthy lifestyle changes, such as: Eating healthy foods. Exercising more. Drinking less alcohol . Your doctor may prescribe medicine if lifestyle changes do not help enough and if: Your top number is above 130. Your bottom number is above 80. Your personal target blood pressure may vary. Follow these instructions at home: Eating and drinking  If told, follow the DASH eating plan. To follow this plan: Fill one half of your plate at each meal with fruits and vegetables. Fill one fourth of your plate  at each meal with whole grains. Whole grains include whole-wheat pasta, brown rice, and whole-grain bread. Eat or drink low-fat dairy products, such as skim milk or low-fat yogurt. Fill one fourth of your plate at each meal with low-fat (lean) proteins. Low-fat proteins include fish, chicken without skin, eggs, beans, and tofu. Avoid fatty meat, cured and processed meat, or chicken with skin. Avoid pre-made or processed food. Limit the amount of salt in your diet to less than 1,500 mg each day. Do not drink alcohol  if: Your doctor tells you not to drink. You are pregnant, may be pregnant, or are planning to become pregnant. If you drink alcohol : Limit how much you have to: 0-1 drink a day for women. 0-2 drinks a day for men. Know how much alcohol  is in your drink. In the [place] one drink equals one 12 oz bottle of beer (355 mL), one 5 oz glass of wine (148 mL), or one 1 oz glass of hard liquor (44 mL). Lifestyle  Work with your doctor to stay at a healthy weight or to lose weight. Ask your doctor what the best weight is for you. Get at least 30 minutes of exercise that causes your heart to beat faster (aerobic exercise) most days of the week. This may include walking, swimming, or biking. Get at least 30 minutes of exercise that strengthens your muscles (resistance exercise) at least 3 days a week. This may include lifting weights or doing Pilates. Do not smoke or use any products that contain nicotine  or tobacco. If you need help quitting, ask your doctor. Check your blood pressure at home as told by your doctor. Keep all follow-up visits. Medicines Take over-the-counter and prescription medicines  only as told by your doctor. Follow directions carefully. Do not skip doses of blood pressure medicine. The medicine does not work as well if you skip doses. Skipping doses also puts you at risk for problems. Ask your doctor about side effects or reactions to medicines that you should watch  for. Contact a doctor if: You think you are having a reaction to the medicine you are taking. You have headaches that keep coming back. You feel dizzy. You have swelling in your ankles. You have trouble with your vision. Get help right away if: You get a very bad headache. You start to feel mixed up (confused). You feel weak or numb. You feel faint. You have very bad pain in your: Chest. Belly (abdomen). You vomit more than once. You have trouble breathing. These symptoms may be an emergency. Get help right away. Call 911. Do not wait to see if the symptoms will go away. Do not drive yourself to the hospital. Summary Hypertension is another name for high blood pressure. High blood pressure forces your heart to work harder to pump blood. For most people, a normal blood pressure is less than 120/80. Making healthy choices can help lower blood pressure. If your blood pressure does not get lower with healthy choices, you may need to take medicine. This information is not intended to replace advice given to you by your health care provider. Make sure you discuss any questions you have with your health care provider. Document Revised: 04/10/2021 Document Reviewed: 04/10/2021 Elsevier Patient Education  2024 [employer].

## 2024-02-22 NOTE — Progress Notes (Deleted)
 I,Trevyn Lumpkin T Emmitt, CMA,acting as a Neurosurgeon for [name], MD.,have documented all relevant documentation on the behalf of [name], MD,as directed by  [name], MD while in the presence of [name], MD.  Subjective:  Patient ID: Meredith Velez , female    DOB: 03-27-1986 , 38 y.o.   MRN: [id number]  No chief complaint on file.   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   Seasonal allergies    Ulcer, stomach peptic    UTI (lower urinary tract infection)    Wears glasses      Family History  Problem Relation Age of Onset   Hypertension Mother    Hyperlipidemia Mother    Heart disease Mother    Kidney disease Mother    Diabetes Mother    Hypertension Father    Stroke Father    Diabetes Father    Hypertension Brother    Breast cancer Maternal Aunt    Lung cancer Maternal Uncle    Prostate cancer Maternal Grandmother    Prostate cancer Maternal Grandfather    Alzheimer's disease Paternal Grandmother      Current Outpatient Medications:    amLODipine  (NORVASC ) 5 MG tablet, Take 0.5 tablets (2.5 mg total) by mouth daily., Disp: 180 tablet, Rfl: 3   Cholecalciferol (VITAMIN D3) 125 MCG (5000 UT) TABS, Take 5,000 Units by mouth daily at 12 noon., Disp: , Rfl:    clobetasol  (TEMOVATE ) 0.05 % external solution, Apply 1 Application topically 2 (two) times daily. Apply twice daily as needed for flares, Disp: 50 mL, Rfl: 2   clobetasol  ointment (TEMOVATE ) 0.05 %, Apply 1 Application topically 2 (two) times daily. Apply to body BID for two weeks on then two weeks off, Disp: 60 g, Rfl: 2   furosemide  (LASIX ) 20 MG tablet, Take 1 tablet (20 mg total) by mouth daily., Disp: 90 tablet, Rfl: 3   ibuprofen  (ADVIL ) 600 MG  tablet, Take 1 tablet (600 mg total) by mouth every 6 (six) hours as needed for moderate pain (pain score 4-6) or cramping., Disp: 30 tablet, Rfl: 0   metoprolol  succinate (TOPROL -XL) 100 MG 24 hr tablet, Take 1 tablet (100 mg total) by mouth daily. TAKE WITH OR IMMEDIATELY FOLLOWING A MEAL., Disp: 90 tablet, Rfl: 3   Roflumilast  (ZORYVE ) 0.3 % CREA, Apply 1 application  topically daily., Disp: 60 g, Rfl: 5   sacubitril -valsartan  (ENTRESTO ) 49-51 MG, Take 1 tablet by mouth 2 (two) times daily., Disp: 180 tablet, Rfl: 3   spironolactone  (ALDACTONE ) 25 MG tablet, Take 1 tablet (25 mg total) by mouth daily., Disp: 90 tablet, Rfl: 3   tacrolimus  (PROTOPIC ) 0.1 % ointment, Apply topically 2 (two) times daily. Apply to face and affected areas on body twice daily for 2 weeks then stop. Restart in 2 weeks, Disp: 100 g, Rfl: 2   triamcinolone  cream (KENALOG ) 0.1 %, APPLY TO AFFECTED AREA TWICE DAILY AS NEEDED, Disp: 45 g, Rfl: 0   valACYclovir  (VALTREX ) 1000 MG tablet, Take 1 tablet by mouth daily as needed (Cold sores)., Disp: , Rfl:    No Known Allergies   Review of Systems  Constitutional: Negative.   Respiratory:  Negative.    Cardiovascular: Negative.   Neurological: Negative.   Psychiatric/Behavioral: Negative.       There were no vitals filed for this visit. There is no height or weight on file to calculate BMI.  Wt Readings from Last 3 Encounters:  12/03/23 178 lb 9.6 oz (81 kg)  11/19/23 177 lb (80.3 kg)  11/02/23 177 lb (80.3 kg)     Objective:  Physical Exam      Assessment And Plan:  Hypertensive heart disease with chronic systolic congestive heart failure (HCC)     No follow-ups on file.  Patient was given opportunity to ask questions. Patient verbalized understanding of the plan and was able to repeat key elements of the plan. All questions were answered to their satisfaction.  [name], MD  I, [name], MD, have reviewed all documentation for this visit.  The documentation on 02/22/24 for the exam, diagnosis, procedures, and orders are all accurate and complete.   IF YOU HAVE BEEN REFERRED TO A SPECIALIST, IT MAY TAKE 1-2 WEEKS TO SCHEDULE/PROCESS THE REFERRAL. IF YOU HAVE NOT HEARD FROM US /SPECIALIST IN TWO WEEKS, PLEASE GIVE US  A CALL AT [phone].   THE PATIENT IS ENCOURAGED TO PRACTICE SOCIAL DISTANCING DUE TO THE COVID-19 PANDEMIC.

## 2024-02-23 ENCOUNTER — Ambulatory Visit: Payer: Self-pay | Admitting: General Practice

## 2024-02-23 NOTE — Progress Notes (Unsigned)
 Cardiology Clinic Note   Patient Name: Meredith Velez Date of Encounter: 02/24/2024  Primary Care Provider:  Jarold Medici, MD Primary Cardiologist:  Oneil Parchment, MD  Patient Profile    Meredith Velez 38 year old female presents the clinic today for follow-up evaluation of her hypertension.  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   Seasonal allergies    Ulcer, stomach peptic    UTI (lower urinary tract infection)    Wears glasses    Past Surgical History:  Procedure Laterality Date   COLONOSCOPY     DILITATION & CURRETTAGE/HYSTROSCOPY WITH HYDROTHERMAL ABLATION N/A 05/17/2023   Procedure: DILATATION & CURETTAGE/HYSTEROSCOPY WITH HYDROTHERMAL ABLATION;  Surgeon: Henry Slough, MD;  Location: [facility] OR;  Service: Gynecology;  Laterality: N/A;   LAPAROSCOPIC BILATERAL SALPINGECTOMY Bilateral 01/04/2019   Procedure: LAPAROSCOPIC BILATERAL SALPINGECTOMY;  Surgeon: Henry Slough, MD;  Location: [facility], [place];  Service: Gynecology;  Laterality: Bilateral;    Allergies  No Known Allergies  History of Present Illness    Meredith Velez has a PMH of HTN, anxiety, depression, and peripartum cardiomyopathy.  She was hospitalized in 2020.  Her initial ejection fraction was 20-25%.  She was placed on Entresto  and metoprolol  succinate.  Her repeat echocardiogram showed EF of 45%.  She expressed understanding that she would not be able to take Entresto  while pregnant.  She underwent tubal ligation with her last pregnancy.  Initially she was depressed about her cardiac situation.  [facility] social work helped her significantly with life coaching and patient care.  She participated in the [facility] health care  team education video.  Her blood pressure was well-controlled.  Her daughter runs track and she wanted to be able to walk the track more often.  She was seen in follow-up by Orren Fabry, PA-C on 08/23/2023.  During that time she presented for routine follow-up.  She had been feeling fine.  She was trying to stay physically active.  She was walking regularly at home on her treadmill.  She was cutting back on her soft drink consumption and drink more water.  She noted that her blood pressure had been higher than usual during recent visits.  This was unusual for her.  She had not been checking her blood pressure at home but agreed to start checking her blood pressure daily.  She noted that she had a DNC a year prior and noted that her blood pressure was elevated during that time.  She denies shortness of breath, dyspnea, chest pain, lower extremity swelling, orthopnea and palpitations.  Her PCP contacted the cardiology clinic today.  Patient was added to my schedule for evaluation of her elevated blood pressure.  She presented to the clinic 12/03/23 for follow-up evaluation and stated she had not been feeling well for several days.  She noticed that for  2 weeks she had not been able to exercise regularly.  She noticed increased work of breathing with increased physical activity.  She also noticed that her blood pressure had been steadily increasing.  Her blood pressure was 130/98.  She had recent blood work done at her PCP which shows stable electrolytes and renal function.  She  denied bleeding issues.  We reviewed her previous cardiac history.  She expressed understanding.  She notes that she did have recent travel to [place]  with her husband.  She was in the [place] area.  I  started amlodipine  2.5 mg daily, refilled her spironolactone , and ordered echocardiogram for further prognostication.  Follow-up in 2 to 3 months was planned.  Echocardiogram reassuring.  She presents to the clinic today for follow-up  evaluation and states she has been doing more cardio.  She has been very strict with her diet.  She is feeling well.  She reports that she was not able to acquire life insurance due to her health conditions.  We reviewed her recent echocardiogram.  She expressed understanding.  She reports that Dr. Jarold increased her spironolactone  and she needs a refill for this.  She presents with her mother.  I will plan follow-up in 9 to 12 months.  Today she denies chest pain, lower extremity edema, fatigue, palpitations, melena, hematuria, hemoptysis, diaphoresis, weakness, presyncope, syncope, orthopnea, and PND.     Home Medications    Prior to Admission medications   Medication Sig Start Date End Date Taking? Authorizing Provider  Cholecalciferol (VITAMIN D3) 125 MCG (5000 UT) TABS Take 5,000 Units by mouth daily at 12 noon.    [provider]  clobetasol  (TEMOVATE ) 0.05 % external solution Apply 1 Application topically 2 (two) times daily. Apply twice daily as needed for flares 01/27/23   Alm Delon SAILOR, DO  clobetasol  ointment (TEMOVATE ) 0.05 % Apply 1 Application topically 2 (two) times daily. Apply to body BID for two weeks on then two weeks off 10/28/22   Alm Delon SAILOR, DO  furosemide  (LASIX ) 20 MG tablet Take 1 tablet (20 mg total) by mouth daily. 08/23/23   Conte, Tessa N, PA-C  ibuprofen  (ADVIL ) 600 MG tablet Take 1 tablet (600 mg total) by mouth every 6 (six) hours as needed for moderate pain (pain score 4-6) or cramping. 05/17/23   Henry Slough, MD  metoprolol  succinate (TOPROL -XL) 100 MG 24 hr tablet Take 1 tablet (100 mg total) by mouth daily. TAKE WITH OR IMMEDIATELY FOLLOWING A MEAL. 08/23/23   Conte, Tessa N, PA-C  Roflumilast  (ZORYVE ) 0.3 % CREA Apply 1 application  topically daily. 11/08/23   Alm Delon SAILOR, DO  sacubitril -valsartan  (ENTRESTO ) 49-51 MG Take 1 tablet by mouth 2 (two) times daily. 08/23/23   Lucien Orren SAILOR, PA-C  spironolactone  (ALDACTONE ) 25 MG tablet  Take 0.5 tablets (12.5 mg total) by mouth daily. 08/23/23   Lucien Orren SAILOR, PA-C  tacrolimus  (PROTOPIC ) 0.1 % ointment Apply topically 2 (two) times daily. Apply to face and affected areas on body twice daily for 2 weeks then stop. Restart in 2 weeks 10/28/22   Alm Delon SAILOR, DO  triamcinolone  cream (KENALOG ) 0.1 % APPLY TO AFFECTED AREA TWICE DAILY AS NEEDED Patient not taking: Reported on 05/11/2023 08/25/22   Jarold Medici, MD  Ubrogepant  (UBRELVY ) 50 MG TABS Take 1 tablet (50 mg total) by mouth daily. Patient not taking: Reported on 05/11/2023 01/18/23   Moore, Janece, FNP  valACYclovir  (VALTREX ) 1000 MG tablet Take 1 tablet by mouth daily as needed (Cold sores). 08/27/19   [provider]    Family History    Family History  Problem Relation Age of Onset   Hypertension Mother    Hyperlipidemia Mother    Heart disease Mother    Kidney disease Mother    Diabetes Mother    Hypertension Father  Stroke Father    Diabetes Father    Hypertension Brother    Breast cancer Maternal Aunt    Lung cancer Maternal Uncle    Prostate cancer Maternal Grandmother    Prostate cancer Maternal Grandfather    Alzheimer's disease Paternal Grandmother    She indicated that her mother is alive. She indicated that her father is deceased. She indicated that the status of her brother is unknown. She indicated that the status of her maternal grandmother is unknown. She indicated that the status of her maternal grandfather is unknown. She indicated that the status of her paternal grandmother is unknown. She indicated that the status of her maternal aunt is unknown. She indicated that the status of her maternal uncle is unknown.  Social History    Social History   Socioeconomic History   Marital status: Married    Spouse name: Not on file   Number of children: Not on file   Years of education: Not on file   Highest education level: Not on file  Occupational History   Occupation: Counselling psychologist: [employer]  Tobacco Use   Smoking status: Never   Smokeless tobacco: Never  Vaping Use   Vaping status: Never Used  Substance and Sexual Activity   Alcohol use: Not Currently    Comment: occ   Drug use: No   Sexual activity: Yes    Birth control/protection: None  Other Topics Concern   Not on file  Social History Narrative   Not on file   Social Drivers of Health   Financial Resource Strain: Not on file  Food Insecurity: No Food Insecurity (04/02/2020)   Hunger Vital Sign    Worried About Running Out of Food in the Last Year: Never true    Ran Out of Food in the Last Year: Never true  Transportation Needs: No Transportation Needs (04/02/2020)   PRAPARE - Administrator, Civil Service (Medical): No    Lack of Transportation (Non-Medical): No  Physical Activity: Not on file  Stress: Stress Concern Present (04/02/2020)   [employer] of Occupational Health - Occupational Stress Questionnaire    Feeling of Stress : Very much  Social Connections: Not on file  Intimate Partner Violence: Not on file     Review of Systems    General:  No chills, fever, night sweats or weight changes.  Cardiovascular:  No chest pain, dyspnea on exertion, edema, orthopnea, palpitations, paroxysmal nocturnal dyspnea. Dermatological: No rash, lesions/masses Respiratory: No cough, dyspnea Urologic: No hematuria, dysuria Abdominal:   No nausea, vomiting, diarrhea, bright red blood per rectum, melena, or hematemesis Neurologic:  No visual changes, wkns, changes in mental status. All other systems reviewed and are otherwise negative except as noted above.  Physical Exam    VS:  BP 110/82   Pulse 71   Ht 5' 2 (1.575 m)   Wt 171 lb (77.6 kg)   SpO2 100%   BMI 31.28 kg/m  , BMI Body mass index is 31.28 kg/m. GEN: Well nourished, well developed, in no acute distress. HEENT: normal. Neck: Supple, no JVD, carotid bruits, or masses. Cardiac: RRR, no murmurs,  rubs, or gallops. No clubbing, cyanosis, edema.  Radials/DP/PT 2+ and equal bilaterally.  Respiratory:  Respirations regular and unlabored, clear to auscultation bilaterally. GI: Soft, nontender, nondistended, BS + x 4. MS: no deformity or atrophy. Skin: warm and dry, no rash. Neuro:  Strength and sensation are intact. Psych: Normal affect.  Accessory Clinical Findings    Recent Labs: 10/27/2023: ALT 18; Hemoglobin 15.3; Platelets 214; TSH 0.976 11/19/2023: BUN 13; Creatinine, Ser 0.96; Potassium 4.9; Sodium 141   Recent Lipid Panel    Component Value Date/Time   CHOL 158 10/27/2023 1225   TRIG 71 10/27/2023 1225   HDL 46 10/27/2023 1225   CHOLHDL 3.4 10/27/2023 1225   CHOLHDL 3 05/11/2014 0937   VLDL 5.8 05/11/2014 0937   LDLCALC 98 10/27/2023 1225         ECG personally reviewed by me today-none today.     Echocardiogram 04/07/2019  Left ventricular ejection fraction, by visual estimation, is 40 to 45%. The left ventricle has moderately decreased function. Mildly increased left ventricular size. There is no left ventricular hypertrophy. 2. Apical window is foreshortened some, making evaluation difficult Compared to echo images from Aug 2020, LVEF appears improved. 3. Global right ventricle has normal systolic function.The right ventricular size is normal. No increase in right ventricular wall thickness   06/24/21 ECHO:   1. Left ventricular ejection fraction, by estimation, is 40 to 45%. The  left ventricle has mildly decreased function. The left ventricle  demonstrates global hypokinesis. Left ventricular diastolic parameters are  consistent with Grade I diastolic  dysfunction (impaired relaxation).   2. Right ventricular systolic function is normal. The right ventricular  size is normal.   3. The mitral valve is normal in structure. Trivial mitral valve  regurgitation. No evidence of mitral stenosis.   4. The aortic valve is normal in structure. Aortic valve  regurgitation is  not visualized. No aortic stenosis is present.   5. The inferior vena cava is normal in size with greater than 50%  respiratory variability, suggesting right atrial pressure of 3 mmHg.  Echocardiogram 09/16/2023  IMPRESSIONS     1. Left ventricular ejection fraction, by estimation, is 45 to 50%. Left  ventricular ejection fraction by 3D volume is 46 %. The left ventricle has  mildly decreased function. The left ventricle demonstrates global  hypokinesis. There is mild concentric  left ventricular hypertrophy. Left ventricular diastolic parameters are  consistent with Grade I diastolic dysfunction (impaired relaxation). The  average left ventricular global longitudinal strain is -9.7 %.   2. Right ventricular systolic function is normal. The right ventricular  size is normal.   3. The mitral valve is normal in structure. Trivial mitral valve  regurgitation. No evidence of mitral stenosis.   4. The aortic valve is normal in structure. Aortic valve regurgitation is  not visualized. No aortic stenosis is present.   5. The inferior vena cava is normal in size with greater than 50%  respiratory variability, suggesting right atrial pressure of 3 mmHg.   FINDINGS   Left Ventricle: Left ventricular ejection fraction, by estimation, is 45  to 50%. Left ventricular ejection fraction by 3D volume is 46 %. The left  ventricle has mildly decreased function. The left ventricle demonstrates  global hypokinesis. The average  left ventricular global longitudinal strain is -9.7 %. Strain was  performed and the global longitudinal strain is indeterminate. The left  ventricular internal cavity size was normal in size. There is mild  concentric left ventricular hypertrophy. Left  ventricular diastolic parameters are consistent with Grade I diastolic  dysfunction (impaired relaxation).   Right Ventricle: The right ventricular size is normal. No increase in  right ventricular wall  thickness. Right ventricular systolic function is  normal.   Left Atrium: Left atrial size was normal in size.  Right Atrium: Right atrial size was normal in size.   Pericardium: There is no evidence of pericardial effusion.   Mitral Valve: The mitral valve is normal in structure. Trivial mitral  valve regurgitation. No evidence of mitral valve stenosis.   Tricuspid Valve: The tricuspid valve is normal in structure. Tricuspid  valve regurgitation is mild . No evidence of tricuspid stenosis.   Aortic Valve: The aortic valve is normal in structure. Aortic valve  regurgitation is not visualized. No aortic stenosis is present.   Pulmonic Valve: The pulmonic valve was normal in structure. Pulmonic valve  regurgitation is trivial. No evidence of pulmonic stenosis.   Aorta: The aortic root is normal in size and structure.   Venous: The inferior vena cava is normal in size with greater than 50%  respiratory variability, suggesting right atrial pressure of 3 mmHg.   IAS/Shunts: No atrial level shunt detected by color flow Doppler.   Additional Comments: 3D was performed not requiring image post processing  on an independent workstation and was abnormal.    Assessment & Plan   1. Chronic systolic CHF, dyspnea with exertion-weight today 171.  Euvolemic.  No increased DOE or activity intolerance.  Continues to be somewhat physically active walking at home.  Echocardiogram 09/16/2023 showed an LVEF of 45-50%, mild concentric LVH, G1 DD, trivial mitral valve regurgitation and no other significant valvular abnormalities.  Echocardiogram 02/22/2024 showed recovered EF.  Details above. Daily weights-maintain weight log Heart healthy low-sodium diet Elevate lower extremities when not active Lower extremity support stockings Continue metoprolol , Entresto , spironolactone , amlodipine  Refill spironolactone   Essential hypertension-BP today 110/82 Maintain blood pressure log Heart healthy  low-sodium diet Maintain physical activity Continue Entresto , spironolactone , furosemide , metoprolol , amlodipine     Disposition: Follow-up with Dr. Jeffrie or me in 9-12 months.   Josefa HERO. Lean Jaeger NP-C     02/24/2024, 2:17 PM [facility] [address] Office [phone] Fax [phone]    I spent 14 minutes examining this patient, reviewing medications, and using patient centered shared decision making involving their cardiac care.   I spent  20 minutes reviewing past medical history,  medications, and prior cardiac tests.

## 2024-02-24 ENCOUNTER — Encounter: Payer: Self-pay | Admitting: General Practice

## 2024-02-24 ENCOUNTER — Ambulatory Visit: Attending: General Practice | Admitting: General Practice

## 2024-02-24 VITALS — BP 110/82 | HR 71 | Ht 62.0 in | Wt 171.0 lb

## 2024-02-24 DIAGNOSIS — I10 Essential (primary) hypertension: Secondary | ICD-10-CM | POA: Diagnosis not present

## 2024-02-24 DIAGNOSIS — R0609 Other forms of dyspnea: Secondary | ICD-10-CM

## 2024-02-24 DIAGNOSIS — I5022 Chronic systolic (congestive) heart failure: Secondary | ICD-10-CM

## 2024-02-24 NOTE — Patient Instructions (Signed)
 Medication Instructions:  Your physician recommends that you continue on your current medications as directed. Please refer to the Current Medication list given to you today.  *If you need a refill on your cardiac medications before your next appointment, please call your pharmacy*  Lab Work: NONE If you have labs (blood work) drawn today and your tests are completely normal, you will receive your results only by: MyChart Message (if you have MyChart) OR A paper copy in the mail If you have any lab test that is abnormal or we need to change your treatment, we will call you to review the results.  Testing/Procedures: NONE  Follow-Up: At [facility], you and your health needs are our priority.  As part of our continuing mission to provide you with exceptional heart care, our providers are all part of one team.  This team includes your primary Cardiologist (physician) and Advanced Practice Providers or APPs (Physician Assistants and Nurse Practitioners) who all work together to provide you with the care you need, when you need it.  Your next appointment:   1 year(s)  Provider:   Oneil Parchment, MD or Josefa Beauvais, NP  We recommend signing up for the patient portal called MyChart.  Sign up information is provided on this After Visit Summary.  MyChart is used to connect with patients for Virtual Visits (Telemedicine).  Patients are able to view lab/test results, encounter notes, upcoming appointments, etc.  Non-urgent messages can be sent to your provider as well.   To learn more about what you can do with MyChart, go to [email or web address].

## 2024-02-28 ENCOUNTER — Other Ambulatory Visit (HOSPITAL_COMMUNITY)

## 2024-03-30 ENCOUNTER — Ambulatory Visit: Admitting: Family Medicine

## 2024-03-30 ENCOUNTER — Encounter: Payer: Self-pay | Admitting: Family Medicine

## 2024-03-30 ENCOUNTER — Ambulatory Visit: Payer: Self-pay

## 2024-03-30 VITALS — BP 110/80 | HR 89 | Temp 97.7°F | Ht 62.0 in | Wt 160.0 lb

## 2024-03-30 DIAGNOSIS — H9202 Otalgia, left ear: Secondary | ICD-10-CM | POA: Diagnosis not present

## 2024-03-30 DIAGNOSIS — I5022 Chronic systolic (congestive) heart failure: Secondary | ICD-10-CM

## 2024-03-30 DIAGNOSIS — J0130 Acute sphenoidal sinusitis, unspecified: Secondary | ICD-10-CM

## 2024-03-30 DIAGNOSIS — I110 Hypertensive heart disease with heart failure: Secondary | ICD-10-CM

## 2024-03-30 MED ORDER — TRIAMCINOLONE ACETONIDE 40 MG/ML IJ SUSP
40.0000 mg | Freq: Once | INTRAMUSCULAR | Status: AC
  Administered 2024-03-30: 40 mg via INTRAMUSCULAR

## 2024-03-30 MED ORDER — DOXYCYCLINE HYCLATE 100 MG PO TABS: 100.0000 mg | ORAL_TABLET | Freq: Two times a day (BID) | ORAL | 0 refills | Status: AC

## 2024-03-30 NOTE — Telephone Encounter (Signed)
 FYI Only or Action Required?: FYI only for provider.  Patient was last seen in primary care on 10/27/2023 by Jarold Medici, MD.  Called Nurse Triage reporting Otalgia (Congestion, ear pain, face pain).  Symptoms began a week ago.  Interventions attempted: OTC medications: Cold and allergy medicine.  Symptoms are: gradually worsening.  Triage Disposition: See Physician Within 24 Hours  Patient/caregiver understands and will follow disposition?: Yes  Reason for Disposition  Ear pain  Answer Assessment - Initial Assessment Questions Onset of congestion and sinus drainage x1 week, onset of pain in left ear radiating to left side of face, left eye, and teeth on left side. Allergy medicine helped with teeth pain, did not help ear pain.  1. LOCATION: Which ear is involved?      Left ear 2. COLOR: What is the color of the discharge?      Clear 3. CONSISTENCY: How runny is the discharge? Could it be water?      Scant clear 4. ONSET: When did you first notice the discharge?     Monday 5. PAIN: Is there any earache? How bad is it?  (Scale 0-10; none, mild, moderate or severe)     Sharp pain through eardrum, radiating to left side of face, eye and teeth on left side of mouth. 6/10. 6. OBJECTS: Have you put anything in your ear? (e.g., Q-tip, other object)      No 7. OTHER SYMPTOMS: Do you have any other symptoms? (e.g., headache, fever, dizziness, vomiting, runny nose)     Headache yesterday, took sinus medicine that helped. 8. PREGNANCY: Is there any chance you are pregnant? When was your last menstrual period?     No, LMP 2 weeks.Tubes removed.  Protocols used: Ear - Discharge-A-AH

## 2024-03-30 NOTE — Progress Notes (Signed)
 I,[name], CMA,acting as a Neurosurgeon for [employer], NP.,have documented all relevant documentation on the behalf of Bruna Creighton, NP,as directed by  Bruna Creighton, NP while in the presence of Bruna Creighton, NP.  Subjective:  Patient ID: Meredith Velez , female    DOB: 26-Jun-1986 , 38 y.o.   MRN: [id number]  Chief Complaint  Patient presents with  . Ear Pain    Patient presents today for left ear pain. She also reports she has been having nasal congestion and sinus pain and pressure. She reports her symptoms started 2 weeks ago.     HPI Discussed the use of AI scribe software for clinical note transcription with the patient, who gave verbal consent to proceed.  History of Present Illness         Meredith Velez is a 38 year old female who presents with earache and sinus issues.  Approximately two weeks ago, she began experiencing sinus drainage and congestion. On Monday, she developed pain in her ear, which has since progressed to include pressure behind her eye and discomfort in her teeth, resembling a toothache.  She has been taking severe allergy and sinus medication, which has alleviated her headache and some toothache, but the ear pain persists. The ear pain is described as a constant ache rated at 6 out of 10, with episodes of sharp, shooting pain that increase the intensity.  No fever or significant ear drainage, although her ear felt wet when touched. She also reports muffled hearing and tenderness when the ear is touched.  She has a history of high blood pressure and has been cautious with medication choices due to this condition. She has no known allergies to antibiotics and has previously received steroid shots for migraines.      Past Medical History:  Diagnosis Date  . Anemia   . Anxiety   . CHF (congestive heart failure) (HCC)   . Depression    pp depression  . Frequent headaches   . GERD (gastroesophageal reflux disease)   . Hypertension   .  Migraines   . 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 Seasonal allergies   . Ulcer, stomach peptic   . UTI (lower urinary tract infection)   . Wears glasses      Family History  Problem Relation Age of Onset  . Hypertension Mother   . Hyperlipidemia Mother   . Heart disease Mother   . Kidney disease Mother   . Diabetes Mother   . Hypertension Father   . Stroke Father   . Diabetes Father   . Hypertension Brother   . Breast cancer Maternal Aunt   . Lung cancer Maternal Uncle   . Prostate cancer Maternal Grandmother   . Prostate cancer Maternal Grandfather   . Alzheimer's disease Paternal Grandmother      Current Outpatient Medications:  .  amLODipine  (NORVASC ) 5 MG tablet, Take 0.5 tablets (2.5 mg total) by mouth daily., Disp: 180 tablet, Rfl: 3 .  Cholecalciferol (VITAMIN D3) 125 MCG (5000 UT) TABS, Take 5,000 Units by mouth daily at 12 noon., Disp: , Rfl:  .  clobetasol  (TEMOVATE ) 0.05 % external solution, Apply 1 Application topically 2 (two) times daily. Apply twice daily as needed for flares, Disp: 50 mL, Rfl: 2 .  clobetasol  ointment (TEMOVATE ) 0.05 %, Apply 1 Application topically 2 (two) times daily. Apply to body BID for two weeks on then two weeks off, Disp: 60 g, Rfl: 2 .  furosemide  (LASIX ) 20 MG tablet,  Take 1 tablet (20 mg total) by mouth daily., Disp: 90 tablet, Rfl: 3 .  ibuprofen  (ADVIL ) 600 MG tablet, Take 1 tablet (600 mg total) by mouth every 6 (six) hours as needed for moderate pain (pain score 4-6) or cramping., Disp: 30 tablet, Rfl: 0 .  metoprolol  succinate (TOPROL -XL) 100 MG 24 hr tablet, Take 1 tablet (100 mg total) by mouth daily. TAKE WITH OR IMMEDIATELY FOLLOWING A MEAL., Disp: 90 tablet, Rfl: 3 .  Roflumilast  (ZORYVE ) 0.3 % CREA, Apply 1 application  topically daily., Disp: 60 g, Rfl: 5 .  sacubitril -valsartan  (ENTRESTO ) 49-51 MG, Take 1 tablet by mouth 2 (two) times daily., Disp: 180 tablet, Rfl: 3 .  spironolactone  (ALDACTONE ) 25 MG tablet, Take 1  tablet (25 mg total) by mouth daily., Disp: 90 tablet, Rfl: 3 .  tacrolimus  (PROTOPIC ) 0.1 % ointment, Apply topically 2 (two) times daily. Apply to face and affected areas on body twice daily for 2 weeks then stop. Restart in 2 weeks, Disp: 100 g, Rfl: 2 .  triamcinolone  cream (KENALOG ) 0.1 %, APPLY TO AFFECTED AREA TWICE DAILY AS NEEDED, Disp: 45 g, Rfl: 0 .  valACYclovir  (VALTREX ) 1000 MG tablet, Take 1 tablet by mouth daily as needed (Cold sores)., Disp: , Rfl:    No Known Allergies   Review of Systems  Constitutional: Negative.   HENT:  Positive for ear pain. Negative for ear discharge, facial swelling and hearing loss.   Respiratory: Negative.    Cardiovascular: Negative.   Musculoskeletal: Negative.   Skin: Negative.   Psychiatric/Behavioral: Negative.       Today's Vitals   03/30/24 1604  BP: 110/80  Pulse: 89  Temp: 97.7 F (36.5 C)  TempSrc: Oral  Weight: 160 lb (72.6 kg)  Height: 5' 2 (1.575 m)  PainSc: 6   PainLoc: Ear   Body mass index is 29.26 kg/m.  Wt Readings from Last 3 Encounters:  03/30/24 160 lb (72.6 kg)  02/24/24 171 lb (77.6 kg)  12/03/23 178 lb 9.6 oz (81 kg)    The ASCVD Risk score (Arnett DK, et al., 2019) failed to calculate for the following reasons:   The 2019 ASCVD risk score is only valid for ages 40 to 71  Objective:  Physical Exam HENT:     Head: Normocephalic.     Right Ear: No decreased hearing noted.     Left Ear: No decreased hearing noted.     Nose:     Right Sinus: Maxillary sinus tenderness present.     Left Sinus: Maxillary sinus tenderness present.  Cardiovascular:     Rate and Rhythm: Normal rate and regular rhythm.  Pulmonary:     Effort: Pulmonary effort is normal.     Breath sounds: Normal breath sounds.  Skin:    General: Skin is warm and dry.  Neurological:     General: No focal deficit present.     Mental Status: She is alert and oriented to person, place, and time.         Assessment And Plan:  Chronic  systolic heart failure (HCC)  Otalgia of left ear -     Triamcinolone  Acetonide  Acute non-recurrent sphenoidal sinusitis -     Doxycycline  Hyclate; Take 1 tablet (100 mg total) by mouth 2 (two) times daily for 7 days.  Dispense: 14 tablet; Refill: 0   Assessment & Plan Left otalgia and acute sinusitis Symptoms indicate acute sinusitis with possible otitis media. - Administered steroid injection for pain relief. -  Prescribed antibiotics for infection. - Advised taking pain medication as needed.    Return if symptoms worsen or fail to improve, for keep next appt.  Patient was given opportunity to ask questions. Patient verbalized understanding of the plan and was able to repeat key elements of the plan. All questions were answered to their satisfaction.    I, Bruna Creighton, NP, have reviewed all documentation for this visit. The documentation on 04/10/2024 for the exam, diagnosis, procedures, and orders are all accurate and complete.     IF YOU HAVE BEEN REFERRED TO A SPECIALIST, IT MAY TAKE 1-2 WEEKS TO SCHEDULE/PROCESS THE REFERRAL. IF YOU HAVE NOT HEARD FROM US /SPECIALIST IN TWO WEEKS, PLEASE GIVE US  A CALL AT [phone].

## 2024-04-11 NOTE — Assessment & Plan Note (Signed)
 Followed by cardiology, continue Entresto  49-51mg  twice daily.

## 2024-05-01 ENCOUNTER — Ambulatory Visit: Payer: Self-pay | Admitting: Internal Medicine

## 2024-05-02 ENCOUNTER — Encounter: Payer: Self-pay | Admitting: Internal Medicine

## 2024-05-02 ENCOUNTER — Ambulatory Visit: Admitting: Internal Medicine

## 2024-05-02 ENCOUNTER — Ambulatory Visit: Payer: Self-pay | Admitting: Internal Medicine

## 2024-05-02 VITALS — BP 118/80 | HR 88 | Temp 98.3°F | Ht 62.0 in | Wt 162.0 lb

## 2024-05-02 DIAGNOSIS — Z2821 Immunization not carried out because of patient refusal: Secondary | ICD-10-CM | POA: Diagnosis not present

## 2024-05-02 DIAGNOSIS — L409 Psoriasis, unspecified: Secondary | ICD-10-CM | POA: Diagnosis not present

## 2024-05-02 DIAGNOSIS — I110 Hypertensive heart disease with heart failure: Secondary | ICD-10-CM

## 2024-05-02 DIAGNOSIS — I5022 Chronic systolic (congestive) heart failure: Secondary | ICD-10-CM

## 2024-05-02 LAB — CMP14+EGFR
ALT: 19 IU/L (ref 0–32)
AST: 21 IU/L (ref 0–40)
Albumin: 4.5 g/dL (ref 3.9–4.9)
Alkaline Phosphatase: 66 IU/L (ref 41–116)
BUN/Creatinine Ratio: 10 (ref 9–23)
BUN: 10 mg/dL (ref 6–20)
Bilirubin Total: 0.6 mg/dL (ref 0.0–1.2)
CO2: 24 mmol/L (ref 20–29)
Calcium: 9.8 mg/dL (ref 8.7–10.2)
Chloride: 104 mmol/L (ref 96–106)
Creatinine, Ser: 1 mg/dL (ref 0.57–1.00)
Globulin, Total: 3.3 g/dL (ref 1.5–4.5)
Glucose: 90 mg/dL (ref 70–99)
Potassium: 3.9 mmol/L (ref 3.5–5.2)
Sodium: 142 mmol/L (ref 134–144)
Total Protein: 7.8 g/dL (ref 6.0–8.5)
eGFR: 73 mL/min/1.73 (ref >59)

## 2024-05-02 NOTE — Progress Notes (Signed)
 I,[name], CMA,acting as a neurosurgeon for Meredith LOISE Slocumb, MD.,have documented all relevant documentation on the behalf of Meredith LOISE Slocumb, MD,as directed by  Meredith LOISE Slocumb, MD while in the presence of Meredith LOISE Slocumb, MD.  Subjective:  Patient ID: Meredith Velez , female    DOB: 11-Nov-1985 , 38 y.o.   MRN: [id number]  Chief Complaint  Patient presents with   Hypertension    Patient presents today for a bpc. Patient reports compliance with her meds. Patient denies having chest pain,sob or headaches at this time. Patient doesn't have any questions or concerns at this time.    HPI Discussed the use of AI scribe software for clinical note transcription with the patient, who gave verbal consent to proceed.  History of Present Illness Meredith Velez is a 38 year old female with hypertension who presents for a blood pressure check.  She feels 'wonderful' and has been making lifestyle changes, including drinking only water since November of last year, following a low-carb diet with no sugar, and incorporating plenty of vegetables and some protein. She has also been engaging in cardio exercises. She has lost nine pounds and notes that her husband has also lost weight. She mentions a previous weight of close to 180 pounds but has been losing weight since then.  She is currently taking amlodipine  2.5 mg daily (half of a 5 mg tablet), furosemide  20 mg, spironolactone  (increased from half to a whole pill), metoprolol  100 mg, and Entresto  49/51 mg twice a day. She has not started Roflumilast , which was prescribed by a doctor in [place], as she has not picked it up from the compounding pharmacy.  She continues to use clobetasol  cream for psoriasis, which she finds effective, and has not yet tried the new cream prescribed. She uses drops for her scalp, which help with flakiness and itching, and also uses Protopic  (tacrolimus ) cream. The drops have prevented flakiness and scabs.  She has  a history of heart failure and mitral valve regurgitation. She has been compliant with her medications and reports feeling good.  She has received the COVID vaccine and had the flu vaccine once during the COVID pandemic. 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       Hypertension This is a chronic problem. The current episode started more than 1 year ago. The problem has been gradually improving since onset. The problem is uncontrolled. Associated symptoms include anxiety. Pertinent negatives include no chest pain, headaches or palpitations. Past treatments include angiotensin blockers. The current treatment provides moderate improvement. Hypertensive end-organ damage includes heart failure.     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   Seasonal allergies    Ulcer, stomach peptic    UTI (lower urinary tract infection)    Wears glasses      Family History  Problem Relation Age of Onset   Hypertension Mother    Hyperlipidemia Mother    Heart disease Mother    Kidney disease Mother    Diabetes Mother    Hypertension Father    Stroke Father    Diabetes Father    Hypertension Brother  Breast cancer Maternal Aunt    Lung cancer Maternal Uncle    Prostate cancer Maternal Grandmother    Prostate cancer Maternal Grandfather    Alzheimer's disease Paternal Grandmother      Current Outpatient Medications:    amLODipine  (NORVASC ) 5 MG tablet, Take 0.5 tablets (2.5 mg total) by mouth daily., Disp: 180 tablet, Rfl: 3   clobetasol  (TEMOVATE ) 0.05 % external solution, Apply 1 Application topically 2 (two) times daily. Apply twice daily as needed for flares, Disp: 50 mL, Rfl:  2   clobetasol  ointment (TEMOVATE ) 0.05 %, Apply 1 Application topically 2 (two) times daily. Apply to body BID for two weeks on then two weeks off, Disp: 60 g, Rfl: 2   furosemide  (LASIX ) 20 MG tablet, Take 1 tablet (20 mg total) by mouth daily., Disp: 90 tablet, Rfl: 3   metoprolol  succinate (TOPROL -XL) 100 MG 24 hr tablet, Take 1 tablet (100 mg total) by mouth daily. TAKE WITH OR IMMEDIATELY FOLLOWING A MEAL., Disp: 90 tablet, Rfl: 3   Multiple Vitamin (MULTIVITAMIN WITH MINERALS) TABS tablet, Take 1 tablet by mouth daily., Disp: , Rfl:    sacubitril -valsartan  (ENTRESTO ) 49-51 MG, Take 1 tablet by mouth 2 (two) times daily., Disp: 180 tablet, Rfl: 3   spironolactone  (ALDACTONE ) 25 MG tablet, Take 1 tablet (25 mg total) by mouth daily., Disp: 90 tablet, Rfl: 3   tacrolimus  (PROTOPIC ) 0.1 % ointment, Apply topically 2 (two) times daily. Apply to face and affected areas on body twice daily for 2 weeks then stop. Restart in 2 weeks, Disp: 100 g, Rfl: 2   valACYclovir  (VALTREX ) 1000 MG tablet, Take 1 tablet by mouth daily as needed (Cold sores)., Disp: , Rfl:    Roflumilast  (ZORYVE ) 0.3 % CREA, Apply 1 application  topically daily. (Patient not taking: Reported on 05/02/2024), Disp: 60 g, Rfl: 5   No Known Allergies   Review of Systems  Constitutional: Negative.   Eyes: Negative.   Respiratory: Negative.    Cardiovascular: Negative.  Negative for chest pain and palpitations.  Gastrointestinal: Negative.   Musculoskeletal: Negative.   Skin: Negative.   Neurological:  Negative for headaches.  Psychiatric/Behavioral: Negative.       Today's Vitals   05/02/24 0907  BP: 118/80  Pulse: 88  Temp: 98.3 F (36.8 C)  TempSrc: Oral  Weight: 162 lb (73.5 kg)  Height: 5' 2 (1.575 m)  PainSc: 0-No pain   Body mass index is 29.63 kg/m.  Wt Readings from Last 3 Encounters:  05/02/24 162 lb (73.5 kg)  03/30/24 160 lb (72.6 kg)  02/24/24 171 lb (77.6 kg)     Objective:  Physical  Exam Vitals and nursing note reviewed.  Constitutional:      Appearance: Normal appearance.  HENT:     Head: Normocephalic and atraumatic.  Eyes:     Extraocular Movements: Extraocular movements intact.  Cardiovascular:     Rate and Rhythm: Normal rate and regular rhythm.     Heart sounds: Normal heart sounds.  Pulmonary:     Effort: Pulmonary effort is normal.     Breath sounds: Normal breath sounds.  Musculoskeletal:     Cervical back: Normal range of motion.  Skin:    General: Skin is warm.  Neurological:     General: No focal deficit present.     Mental Status: She is alert.  Psychiatric:        Mood and Affect: Mood normal.        Behavior: Behavior normal.  Assessment And Plan:  Hypertensive heart disease with chronic systolic congestive heart failure (HCC) Assessment & Plan: Chronic, controlled.  Improved cardiac function with low normal heart function and trivial mitral valve regurgitation. Compliance with medication regimen noted.  She will continue with amlodipine  5mg  1/2 tab daily, furosemide  20mg  dally,  metoprolol  XL 100mg  daily, Entresto  49/51 twice daily and spironolactone  25mg  daily.  - Continue current heart failure management regimen. - Monitor heart function as per cardiology recommendations. - Follow low sodium diet.  - Follow up in six months for physical exam.   Orders: -     [id number]+[id number]  Psoriasis Assessment & Plan: Chronic, followed by Dermatology.  Symptoms effectively managed with current topical treatments. Roflumilast  cream not yet started. - Contact compounding pharmacy to arrange for shipping of Roflumilast  cream. - Continue using clobetasol  cream, Protopic  cream, and clobetasol  scalp drops as needed.   Influenza vaccination declined   General Health Maintenance Advised on vaccines and COVID exposure precautions due to heart failure history and work environment. - Consider receiving the pneumonia vaccine. - Consider receiving  the flu vaccine annually. - Wear a mask in crowded places, especially on planes, to reduce COVID exposure.  Return in about 4 months (around 09/02/2024) for BPC.  Patient was given opportunity to ask questions. Patient verbalized understanding of the plan and was able to repeat key elements of the plan. All questions were answered to their satisfaction.   I, Meredith LOISE Slocumb, MD, have reviewed all documentation for this visit. The documentation on 05/02/24 for the exam, diagnosis, procedures, and orders are all accurate and complete.   IF YOU HAVE BEEN REFERRED TO A SPECIALIST, IT MAY TAKE 1-2 WEEKS TO SCHEDULE/PROCESS THE REFERRAL. IF YOU HAVE NOT HEARD FROM US /SPECIALIST IN TWO WEEKS, PLEASE GIVE US  A CALL AT [phone].

## 2024-05-02 NOTE — Patient Instructions (Signed)
 Hypertension, Adult Hypertension is another name for high blood pressure. High blood pressure forces your heart to work harder to pump blood. This can cause problems over time. There are two numbers in a blood pressure reading. There is a top number (systolic) over a bottom number (diastolic). It is best to have a blood pressure that is below 120/80. What are the causes? The cause of this condition is not known. Some other conditions can lead to high blood pressure. What increases the risk? Some lifestyle factors can make you more likely to develop high blood pressure: Smoking. Not getting enough exercise or physical activity. Being overweight. Having too much fat, sugar, calories, or salt (sodium) in your diet. Drinking too much alcohol. Other risk factors include: Having any of these conditions: Heart disease. Diabetes. High cholesterol. Kidney disease. Obstructive sleep apnea. Having a family history of high blood pressure and high cholesterol. Age. The risk increases with age. Stress. What are the signs or symptoms? High blood pressure may not cause symptoms. Very high blood pressure (hypertensive crisis) may cause: Headache. Fast or uneven heartbeats (palpitations). Shortness of breath. Nosebleed. Vomiting or feeling like you may vomit (nauseous). Changes in how you see. Very bad chest pain. Feeling dizzy. Seizures. How is this treated? This condition is treated by making healthy lifestyle changes, such as: Eating healthy foods. Exercising more. Drinking less alcohol. Your doctor may prescribe medicine if lifestyle changes do not help enough and if: Your top number is above 130. Your bottom number is above 80. Your personal target blood pressure may vary. Follow these instructions at home: Eating and drinking  If told, follow the DASH eating plan. To follow this plan: Fill one half of your plate at each meal with fruits and vegetables. Fill one fourth of your plate  at each meal with whole grains. Whole grains include whole-wheat pasta, brown rice, and whole-grain bread. Eat or drink low-fat dairy products, such as skim milk or low-fat yogurt. Fill one fourth of your plate at each meal with low-fat (lean) proteins. Low-fat proteins include fish, chicken without skin, eggs, beans, and tofu. Avoid fatty meat, cured and processed meat, or chicken with skin. Avoid pre-made or processed food. Limit the amount of salt in your diet to less than 1,500 mg each day. Do not drink alcohol if: Your doctor tells you not to drink. You are pregnant, may be pregnant, or are planning to become pregnant. If you drink alcohol: Limit how much you have to: 0-1 drink a day for women. 0-2 drinks a day for men. Know how much alcohol is in your drink. In the [place] one drink equals one 12 oz bottle of beer (355 mL), one 5 oz glass of wine (148 mL), or one 1 oz glass of hard liquor (44 mL). Lifestyle  Work with your doctor to stay at a healthy weight or to lose weight. Ask your doctor what the best weight is for you. Get at least 30 minutes of exercise that causes your heart to beat faster (aerobic exercise) most days of the week. This may include walking, swimming, or biking. Get at least 30 minutes of exercise that strengthens your muscles (resistance exercise) at least 3 days a week. This may include lifting weights or doing Pilates. Do not smoke or use any products that contain nicotine or tobacco. If you need help quitting, ask your doctor. Check your blood pressure at home as told by your doctor. Keep all follow-up visits. Medicines Take over-the-counter and prescription medicines  only as told by your doctor. Follow directions carefully. Do not skip doses of blood pressure medicine. The medicine does not work as well if you skip doses. Skipping doses also puts you at risk for problems. Ask your doctor about side effects or reactions to medicines that you should watch  for. Contact a doctor if: You think you are having a reaction to the medicine you are taking. You have headaches that keep coming back. You feel dizzy. You have swelling in your ankles. You have trouble with your vision. Get help right away if: You get a very bad headache. You start to feel mixed up (confused). You feel weak or numb. You feel faint. You have very bad pain in your: Chest. Belly (abdomen). You vomit more than once. You have trouble breathing. These symptoms may be an emergency. Get help right away. Call 911. Do not wait to see if the symptoms will go away. Do not drive yourself to the hospital. Summary Hypertension is another name for high blood pressure. High blood pressure forces your heart to work harder to pump blood. For most people, a normal blood pressure is less than 120/80. Making healthy choices can help lower blood pressure. If your blood pressure does not get lower with healthy choices, you may need to take medicine. This information is not intended to replace advice given to you by your health care provider. Make sure you discuss any questions you have with your health care provider. Document Revised: 04/10/2021 Document Reviewed: 04/10/2021 Elsevier Patient Education  2024 [employer].

## 2024-05-02 NOTE — Assessment & Plan Note (Signed)
 Chronic, followed by Dermatology.  Symptoms effectively managed with current topical treatments. Roflumilast  cream not yet started. - Contact compounding pharmacy to arrange for shipping of Roflumilast  cream. - Continue using clobetasol  cream, Protopic  cream, and clobetasol  scalp drops as needed.

## 2024-05-02 NOTE — Assessment & Plan Note (Signed)
 Chronic, controlled.  Improved cardiac function with low normal heart function and trivial mitral valve regurgitation. Compliance with medication regimen noted.  She will continue with amlodipine  5mg  1/2 tab daily, furosemide  20mg  dally,  metoprolol  XL 100mg  daily, Entresto  49/51 twice daily and spironolactone  25mg  daily.  - Continue current heart failure management regimen. - Monitor heart function as per cardiology recommendations. - Follow low sodium diet.  - Follow up in six months for physical exam.

## 2024-05-10 ENCOUNTER — Ambulatory Visit: Admitting: Dermatology

## 2024-10-10 ENCOUNTER — Ambulatory Visit: Admitting: Dermatology

## 2024-10-31 ENCOUNTER — Encounter: Payer: Self-pay | Admitting: Internal Medicine
